# Patient Record
Sex: Male | Born: 1950 | Race: White | Hispanic: No | Marital: Married | Smoking: Former smoker
Health system: Southern US, Community
[De-identification: ages and names within clinical notes are randomized; demographics above are authoritative.]

## PROBLEM LIST (undated history)

## (undated) DIAGNOSIS — G8929 Other chronic pain: Secondary | ICD-10-CM

## (undated) DIAGNOSIS — I10 Essential (primary) hypertension: Secondary | ICD-10-CM

## (undated) HISTORY — PX: TOTAL HIP ARTHROPLASTY: SHX124

## (undated) HISTORY — PX: BACK SURGERY: SHX140

## (undated) HISTORY — PX: REPLACEMENT TOTAL KNEE BILATERAL: SUR1225

---

## 2000-01-19 ENCOUNTER — Ambulatory Visit (HOSPITAL_COMMUNITY): Admission: RE | Admit: 2000-01-19 | Discharge: 2000-01-19 | Payer: Self-pay | Admitting: *Deleted

## 2000-01-19 ENCOUNTER — Encounter: Payer: Self-pay | Admitting: *Deleted

## 2000-02-02 ENCOUNTER — Encounter: Payer: Self-pay | Admitting: *Deleted

## 2000-02-02 ENCOUNTER — Ambulatory Visit (HOSPITAL_COMMUNITY): Admission: RE | Admit: 2000-02-02 | Discharge: 2000-02-02 | Payer: Self-pay | Admitting: *Deleted

## 2000-02-18 ENCOUNTER — Encounter: Payer: Self-pay | Admitting: *Deleted

## 2000-02-18 ENCOUNTER — Ambulatory Visit (HOSPITAL_COMMUNITY): Admission: RE | Admit: 2000-02-18 | Discharge: 2000-02-18 | Payer: Self-pay | Admitting: *Deleted

## 2002-11-28 ENCOUNTER — Encounter: Payer: Self-pay | Admitting: Radiology

## 2002-11-28 ENCOUNTER — Encounter: Payer: Self-pay | Admitting: *Deleted

## 2002-11-28 ENCOUNTER — Encounter: Admission: RE | Admit: 2002-11-28 | Discharge: 2002-11-28 | Payer: Self-pay | Admitting: *Deleted

## 2002-12-14 ENCOUNTER — Encounter: Admission: RE | Admit: 2002-12-14 | Discharge: 2002-12-14 | Payer: Self-pay | Admitting: *Deleted

## 2003-01-05 ENCOUNTER — Encounter: Admission: RE | Admit: 2003-01-05 | Discharge: 2003-01-05 | Payer: Self-pay | Admitting: *Deleted

## 2003-01-31 ENCOUNTER — Inpatient Hospital Stay (HOSPITAL_COMMUNITY): Admission: RE | Admit: 2003-01-31 | Discharge: 2003-02-04 | Payer: Self-pay | Admitting: Orthopedic Surgery

## 2005-01-06 ENCOUNTER — Emergency Department (HOSPITAL_COMMUNITY): Admission: EM | Admit: 2005-01-06 | Discharge: 2005-01-06 | Payer: Self-pay | Admitting: Emergency Medicine

## 2005-01-07 ENCOUNTER — Inpatient Hospital Stay (HOSPITAL_COMMUNITY): Admission: EM | Admit: 2005-01-07 | Discharge: 2005-01-10 | Payer: Self-pay | Admitting: Emergency Medicine

## 2005-03-11 ENCOUNTER — Ambulatory Visit (HOSPITAL_BASED_OUTPATIENT_CLINIC_OR_DEPARTMENT_OTHER): Admission: RE | Admit: 2005-03-11 | Discharge: 2005-03-11 | Payer: Self-pay | Admitting: *Deleted

## 2007-08-10 ENCOUNTER — Encounter: Admission: RE | Admit: 2007-08-10 | Discharge: 2007-08-10 | Payer: Self-pay | Admitting: Orthopedic Surgery

## 2007-09-20 ENCOUNTER — Inpatient Hospital Stay (HOSPITAL_COMMUNITY): Admission: RE | Admit: 2007-09-20 | Discharge: 2007-09-25 | Payer: Self-pay | Admitting: Orthopedic Surgery

## 2009-09-27 IMAGING — CR DG CHEST 2V
2 series · 2 of 2 positions shown · non-contrast
Comparison: None available.

CLINICAL DATA: Preadmission respiratory film in patient for hip
surgery.

CHEST - 2 VIEW

[view not recorded (1 of 2)]
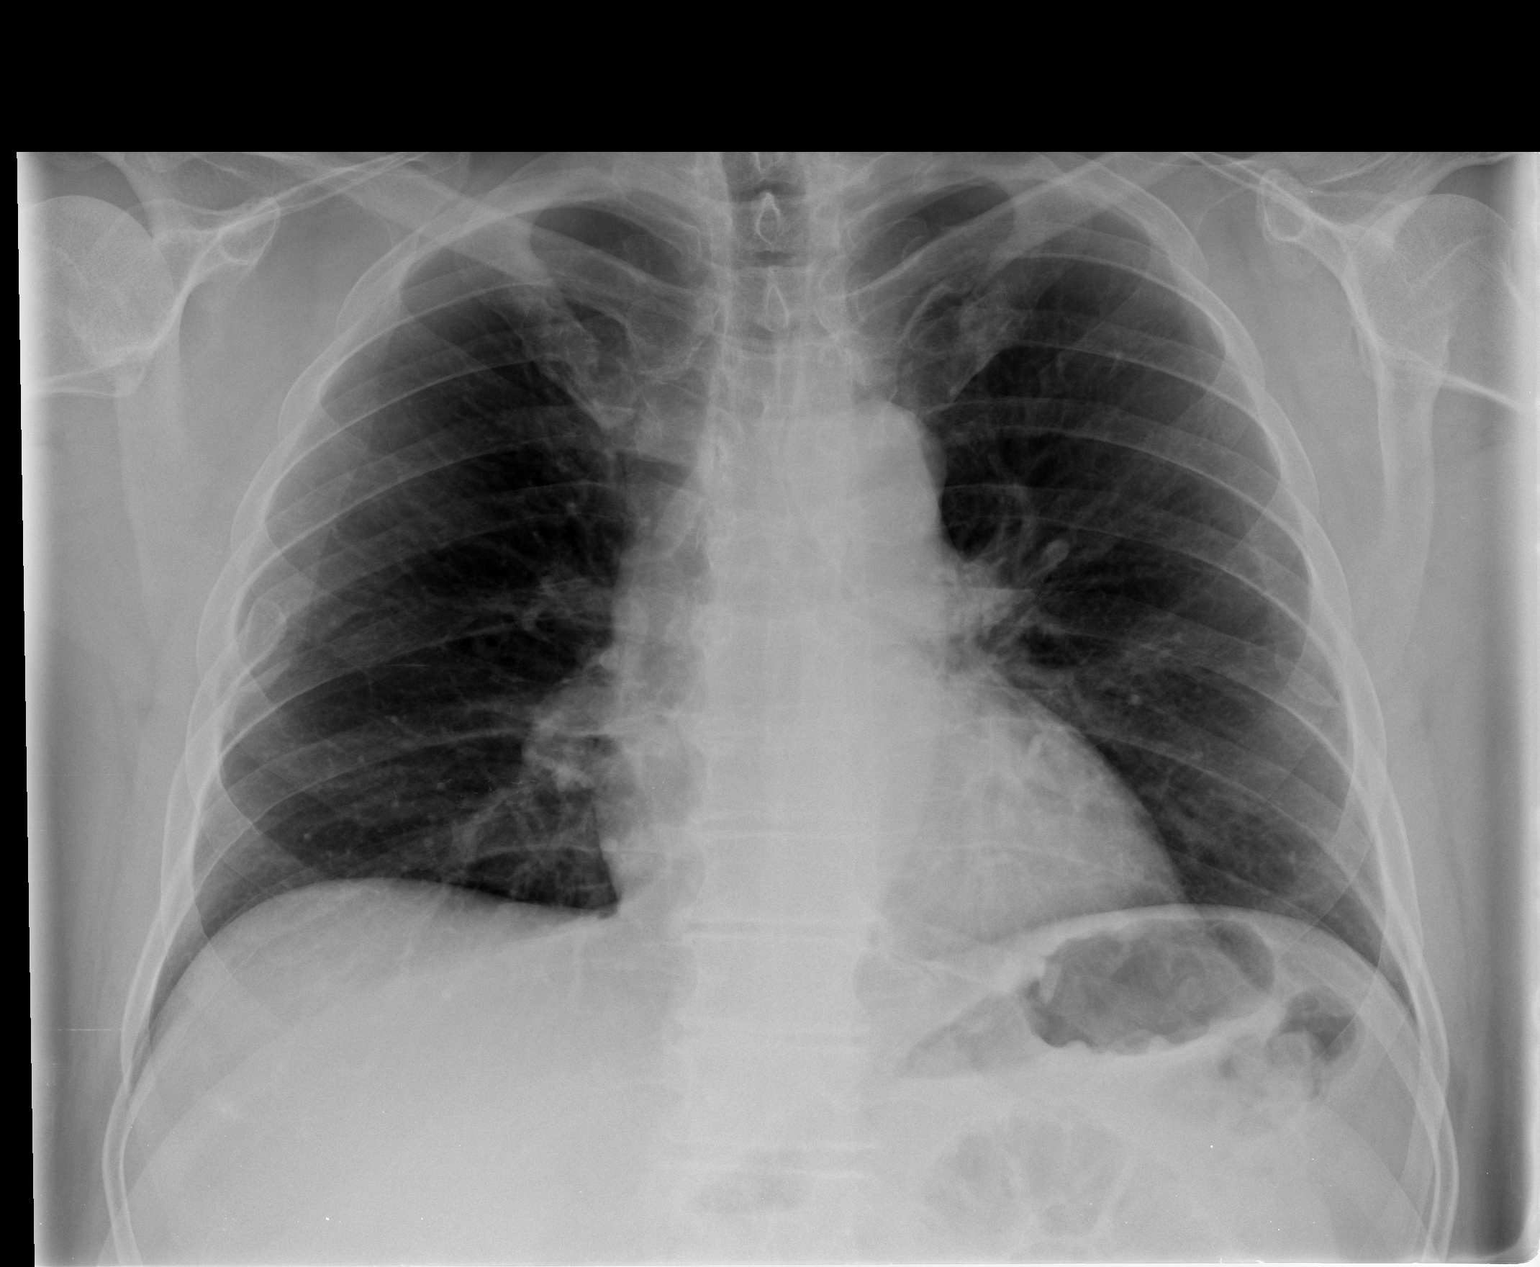

[view not recorded (2 of 2)]
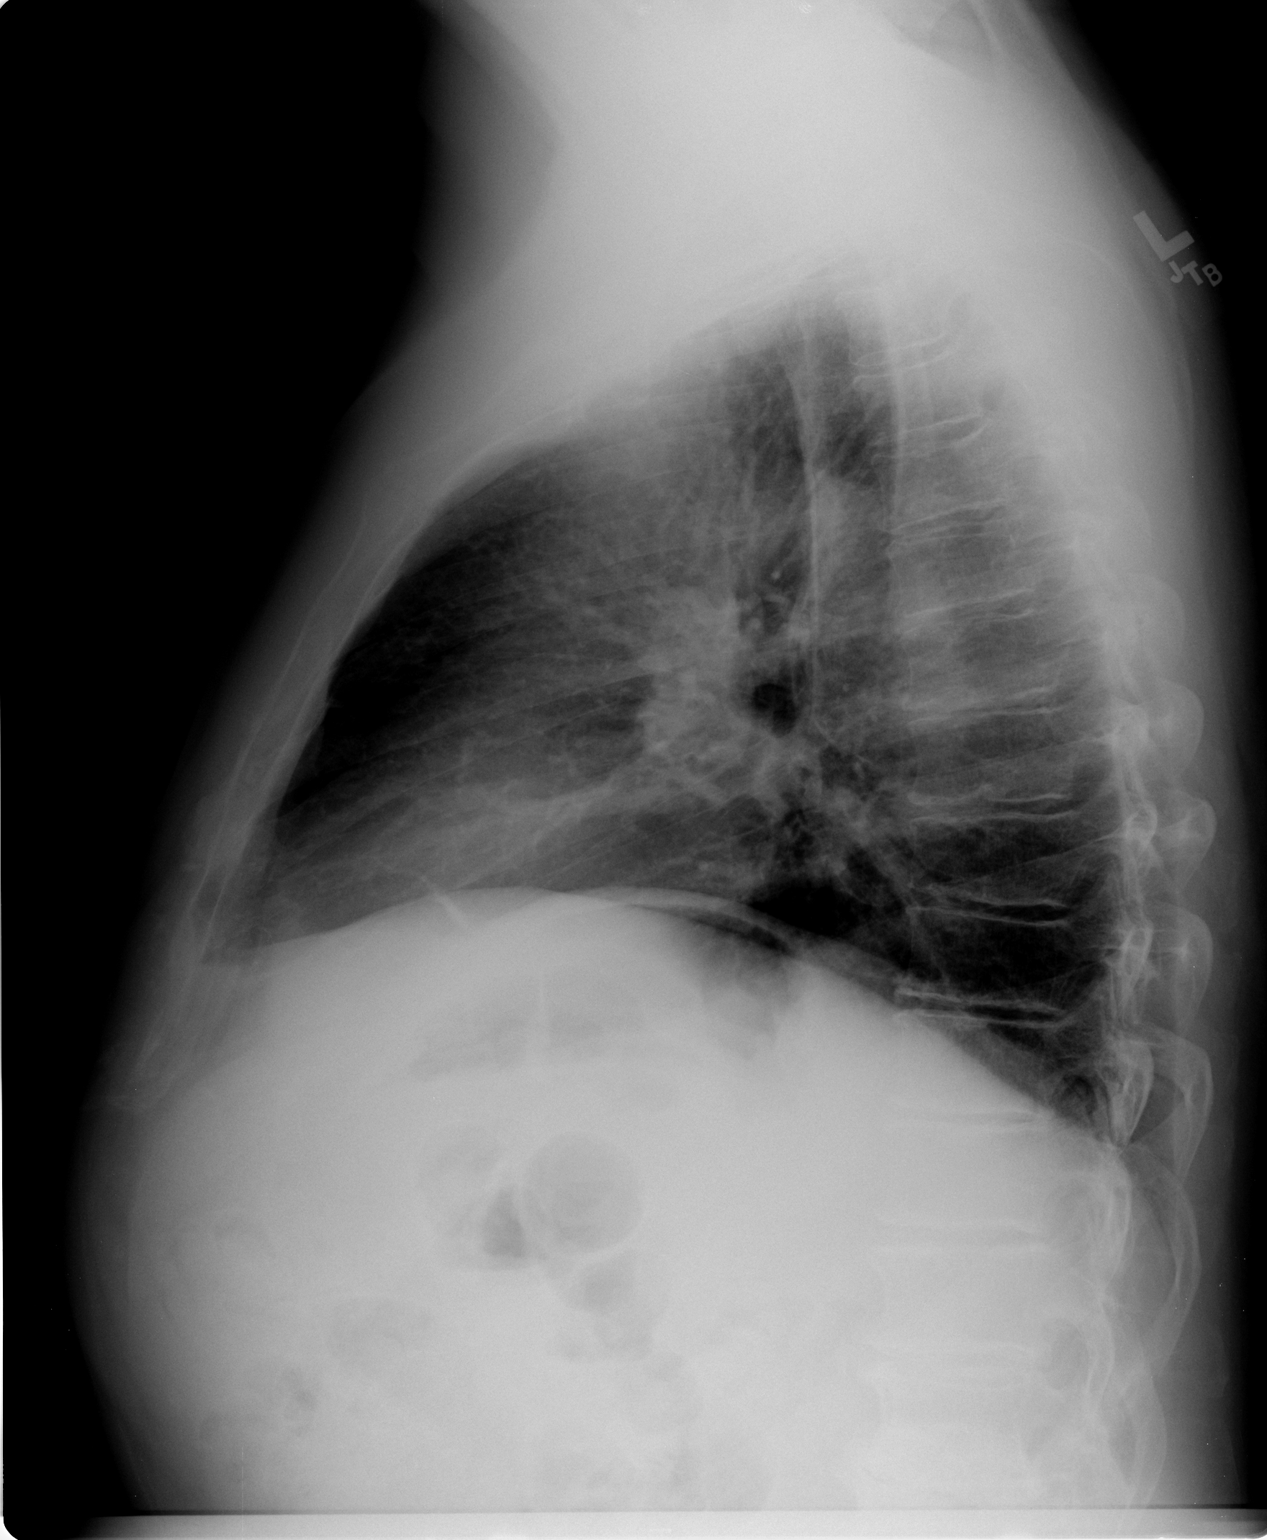

[2 of 2 positions shown; findings below may reference images not displayed]

FINDINGS: The lungs are clear.  There is no pleural effusion.
Heart size is normal.  Remote right rib fracture noted.
IMPRESSION: No acute disease.

## 2009-10-02 IMAGING — CR DG HIP 1V PORT*R*
1 series · 1 of 1 positions shown · non-contrast
Comparison: None.

CLINICAL DATA: Right hip osteoarthritis.

PORTABLE RIGHT HIP - 1 VIEW

[view not recorded]
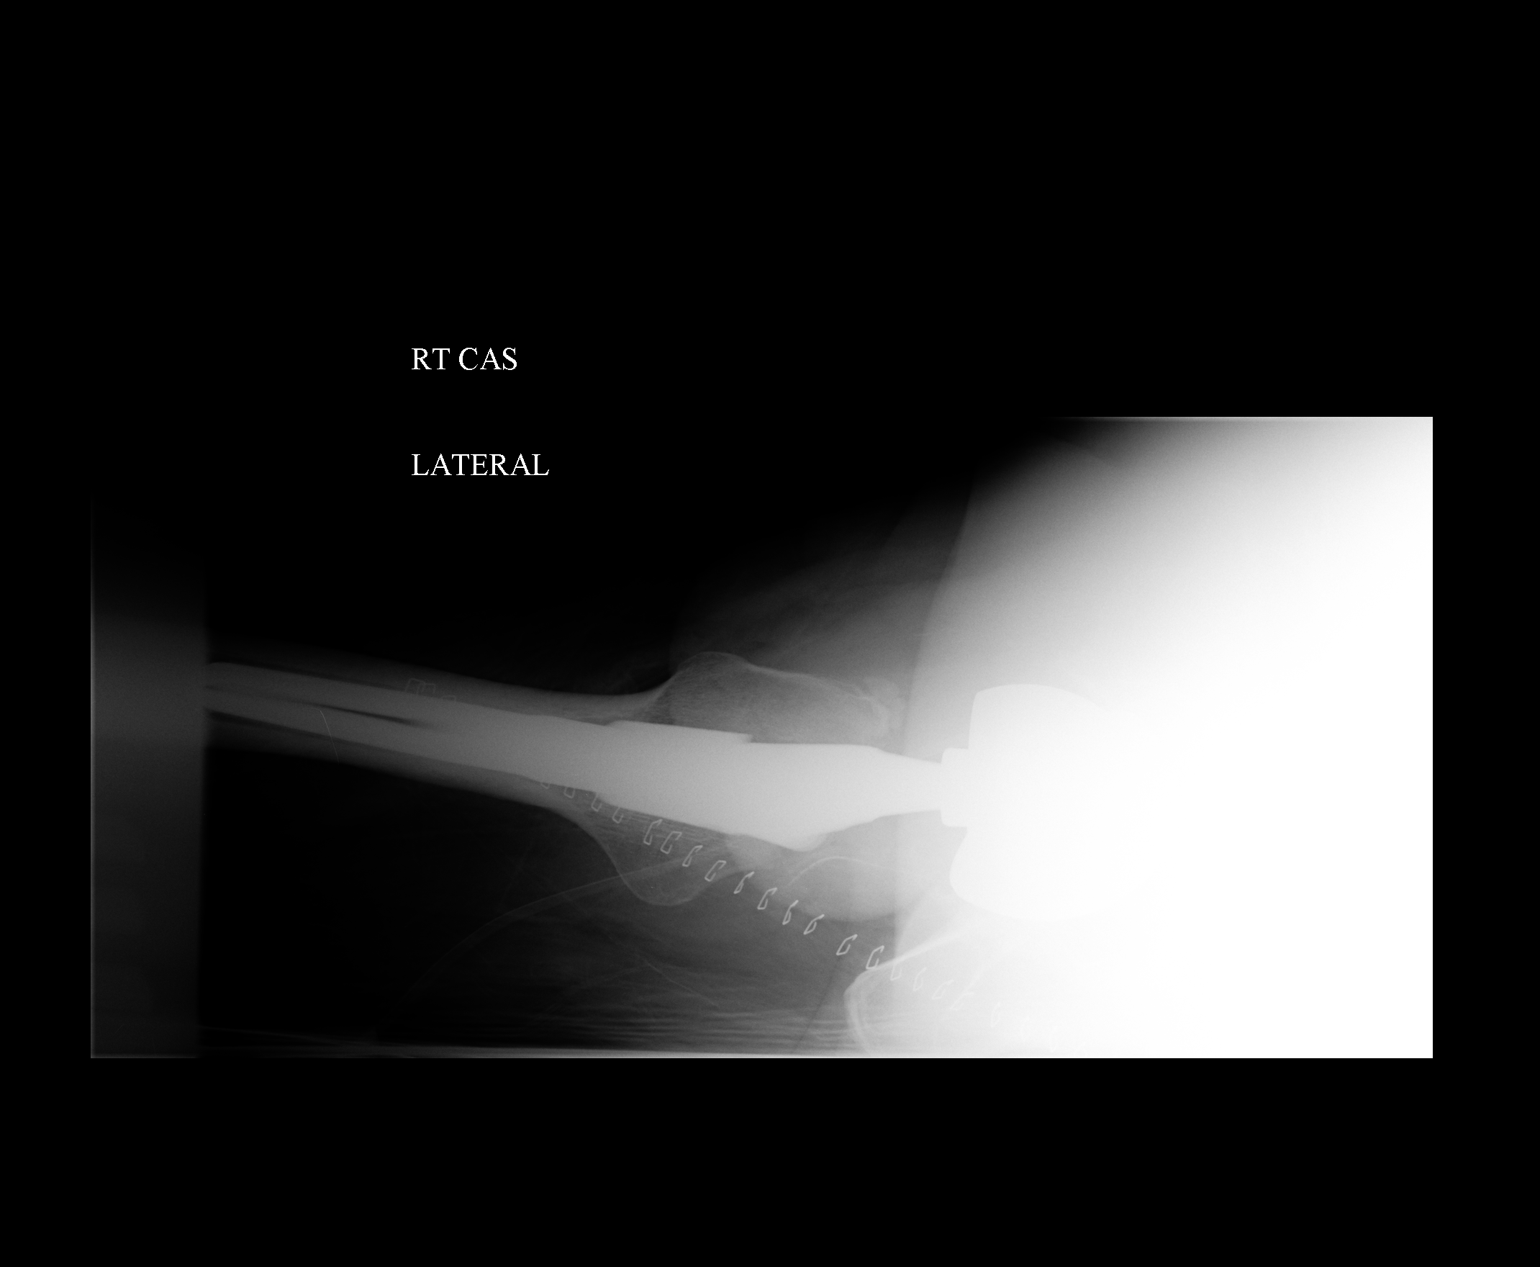

[1 of 1 positions shown; findings below may reference images not displayed]

FINDINGS: Right hip prosthesis appears in satisfactory position.
IMPRESSION: Right hip prosthesis appears in satisfactory position.

## 2010-07-01 NOTE — Op Note (Signed)
NAMEJOAS, MOTTON         ACCOUNT NO.:  [id number]   MEDICAL RECORD NO.:  [id number]          PATIENT TYPE:  INP   LOCATION:  2899                         FACILITY:  [facility]   PHYSICIAN:  Burnard Bunting, M.D.    DATE OF BIRTH:  [birth year]   DATE OF PROCEDURE:  09/20/2007  DATE OF DISCHARGE:                               OPERATIVE REPORT   PREOPERATIVE DIAGNOSIS:  Right hip arthritis.   POSTOPERATIVE DIAGNOSIS:  Right hip arthritis.   PROCEDURE:  Right total hip replacement.   SURGEON:  Burnard Bunting, MD   ASSISTANT:  Jerolyn Shin. Lavender, MD   ANESTHESIA:  General endotracheal.   ESTIMATED BLOOD LOSS:  250 mL.   DRAINS:  Hemovac x1.   INDICATIONS:  Douglas [name] is a 60 year old patient with end-  stage right hip arthritis who presents now for operative management of  his arthritis after explanation of risks and benefits.   COMPONENTS IMPLANTED:  DePuy ASR acetabular cup 58, S-ROM proximal  sleeve 20D large, S-ROM femoral stem 26 x 15 x 165, 36 standard neck,  +12 lateral with ASR 51 head.   PROCEDURE IN DETAIL:  The patient was brought to the operating room  where general endotracheal anesthesia was induced.  Preoperative  antibiotics were administered.  The patient was placed in lateral  decubitus position with left axilla and left peroneal nerve well-padded.  Right hip and leg and foot were prepped with DuraPrep solution after  prescrubbing with alcohol and Betadine which allowed the air to dry.  Collier Flowers was used to cover the operative field.  Sterile drapes were  applied.  Posterior approach to the hip was utilized.  Skin and  subcutaneous tissues were sharply divided.  Fascia lata was encountered  and divided.  The gluteus maximus muscles were divided in line with  their fibers.  The sciatic nerve was palpated and visualized and protect  all times during the case.  Piriformis tendon was tagged and released.  External rotators were detached from the capsule.   Bleeding points were  encountered were controlled using electrocautery.  Capsule was then T  split and tagged with #1 Ethilon suture.  Labrum was excised.  The  femoral neck cut was then made in accordance with preoperative  templating and with a templated modular implant.  Oscillating saw was  utilized.  Cut was made.  The lateralization was then performed with the  use of a rongeur, canal finder, and canal lateralizer.  Canal was then  reamed up to 15.5 mm in accordance with preoperative templating.  At  this time, the acetabulum was then reamed in 45 degrees of abduction and  20 degrees of anteversion.  Bleeding bone was encountered between the  columns.  The cup was then seated in good position.  The spout reaming  was then performed for the proximal femur.  The spout and the proximal  sleeve followed by the trial implant in 20 degrees of anteversion was  then placed.  A +6 and +9 trials were utilized with a 36 standard +12  lateralized neck.  Intraoperative x-ray demonstrated good leg lengths  with +9.  With the +9, the hip was stable in external rotation and full  extension, the position of sleep as well as 90 degrees of hip flexion,  10 degrees of adduction, and 70 degrees of internal rotation.  At this  time, trial femoral components were removed.  Thorough irrigation was  performed.  True femoral components were placed with good seating of the  prosthesis.  The same stability parameters were maintained.  The sciatic  nerve was then palpated and found to be intact.  The capsule was closed  using a #1 Ethilon suture followed by placement of a Hemovac drain.  The  fascia lata was then closed using interrupted inverted #1 suture  followed by interrupted inverted 0 Vicryl suture, 2-0 Vicryl suture, and  skin staples.  Leg lengths were proximally equal.  Bulky dressing was  applied along with knee immobilizer.  The patient tolerated the  procedure well without immediate complication.   Dr. Lenny Pastel  assistance was required all times during the case for retraction of  important neurovascular structures and limb positioning.  His assistance  was a medical necessity.      Burnard Bunting, M.D.  Electronically Signed     GSD/MEDQ  D:  09/20/2007  T:  09/20/2007  Job:  [id number]

## 2010-07-04 NOTE — Discharge Summary (Signed)
NAMEVIBHAV, [name]         ACCOUNT NO.:  [id number]   MEDICAL RECORD NO.:  [id number]          PATIENT TYPE:  INP   LOCATION:  1521                         FACILITY:  [facility]   PHYSICIAN:  Mark C. Ophelia Charter, M.D.    DATE OF BIRTH:  12-Jun-1950   DATE OF ADMISSION:  01/07/2005  DATE OF DISCHARGE:  01/10/2005                                 DISCHARGE SUMMARY   FINAL DIAGNOSIS:  Left hand thumb abscess with cellulitis.   This 60 year old male was admitted with a necrosis of the left thumb.  Negative x-rays for foreign body, temperature 103 and tense swollen hand  with phlebitis approximately up the arm with some cellulitis streaks up just  past the elbow. He was seen by Osvaldo Shipper, MD for consultation and after  evaluation by Dr. Rito Ehrlich, he asked that I see the patient. The patient was  taking some lisinopril HCTZ for hypertension. He had been on some Augmentin,  Percocet, __________ and Tylenol.   HOSPITAL COURSE:  The patient was taken to the operating room on an emergent  basis and had exploration with minimal purulent material found and appeared  to be primarily significant cellulitis present without evidence of flexor  tenosynovitis. He was initially placed on Vancomycin which was stopped on  January 10, 2005. He was instructed on warm water soaks with Betadine. He  was discharged on Keflex 500 mg p.o. t.i.d. and also Vicodin and was having  daily dressing changes and hand soaks at that time.   FINAL DIAGNOSIS:  Left thumb infection with cellulitis. Cultures intraop  were seen as negative but he had significant improvement on Vicodin so is  likely to continue coverage for Gram positive cocci despite the negative  cultures. Office followup with my office one week after discharge and  outpatient dressing changes and debridement and irrigation were arranged.      Mark C. Ophelia Charter, M.D.  Electronically Signed     MCY/MEDQ  D:  01/28/2005  T:  01/29/2005  Job:  [id number]

## 2010-07-04 NOTE — Consult Note (Signed)
NAME:  Douglas [name], Douglas [name] NO.:  [id number]   MEDICAL RECORD NO.:  [id number]          PATIENT TYPE:  EMS   LOCATION:  ED                            FACILITY:  [facility]   PHYSICIAN:  Osvaldo Shipper, MD     DATE OF BIRTH:  [birth year]   DATE OF CONSULTATION:  01/07/2005  DATE OF DISCHARGE:                                   CONSULTATION   The patient's primary doctor is Dr. Maurice Small from [facility] [place].  He also sees a physician by the name of Dr. Vernell Leep  in [place].   REQUESTING PHYSICIAN:  Rhae Lerner. Margretta Ditty, M.D., emergency department.   REASON FOR CONSULTATION:  Left hand infection.   CHIEF COMPLAINT:  Left hand pain for one day.   HISTORY OF PRESENT ILLNESS:  Patient is a 60 year old Caucasian male with a  history of hypertension, significant arthritis, who was apparently well  until yesterday when he noticed a cut in his left thumb area.  The patient  mentions that he gets a lot of cracked skin because of dry skin, and he has  a lot of these cuts in his right hand; however, he noticed a cut in his left  thumb.  He did not think too much about it.  Subsequently, however, he  noticed that the whole area was becoming red and painful.  He also noticed  swelling late in the evening yesterday, at which point, he decided to come  into our ED last night.  In the ED, he was diagnosed with mild cellulitis  and was prescribed Augmentin.  He was asked to follow up with his physician;  however, the patient's condition worsened overnight and over the course of  the day today.  He noticed that the erythema was progressing up his forearm  and all the way up to his upper arm to the axillary region.  He also started  getting bloody and serous drainage from his left thumb.  The patient also  gave a history of a temperature of 103 earlier this morning.  He also gives  a history of chills along with his fever.  He also gave a history of nausea  and  vomiting.  No history of any dysuria or any diarrhea or constipation.  Today he went to his doctor at [facility], who did some blood work, and he was  found to have an elevated white count, at which point, he decided to come  into the ED.   PAST MEDICAL HISTORY:  1.  Hypertension x10 years.  2.  History of total knee replacements bilaterally.  3.  History of left hip replacement.  4.  History of arthritis involving most of his joints, especially his back      and his right hip.   SOCIAL HISTORY:  Lives with his wife at [place].  He works with [employer].  He is married with children.  Quit smoking within 10 years ago.  Has  about a 15-pack-year history of smoking.  Occasional beer consumption.  No  drug use.   FAMILY HISTORY:  Father had arthritis and hypertension.  Died of blood clot,  possibly a PE.  Mother had lung cancer and diabetes.   REVIEW OF SYSTEMS:  A 10-point review of systems was done and was  unremarkable, except as mentioned in the HPI.   PHYSICAL EXAMINATION:  VITAL SIGNS:  Temperature 100.9 here, blood pressure  120/76, heart rate 95, respiratory rate 18, saturating 97% on room air.  GENERAL:  A well-developed and well-nourished individual in slight  discomfort but in no distress.  HEENT:  There is no pallor or icterus.  Oral mucous membranes are slightly  dry.  No oral lesions are seen.  NECK:  Soft and supple.  LUNGS:  Clear to auscultation bilaterally with no wheezes, rhonchi, or  rales.  CARDIOVASCULAR:  S1 and S2 is normal.  Regular rhythm.  There is a systolic  murmur appreciated in the mitral area with no radiation.  ABDOMEN:  Soft, nontender, nondistended.  Bowel sounds are present.  No mass  or organomegaly appreciated.  EXTREMITIES:  Left upper extremity:  The left hand reveals significant  swelling, erythema, and warmth over the thenar eminence, along with the  thumb.  There is definite black-bluish area on the palmar aspect of the  first finger.   This area is definitely cool to palpation, compared to the  rest of his involved area as well as uninvolved area.  His radial pulse is  palpable.  He also has streaking erythema extending all the way up his hand  going up the forearm of the arm all the way to the axilla.  I do not  appreciate any lymph nodes at this time.  Patient is unable to make a fist  with his left hand because of the swelling and the pain.  On the right hand,  he has erythematous rash on the dorsal aspect of the right hand.  He also  has scaly plaque formation over the elbow region dorsally on both his upper  extremities.   LAB DATA:  White count here is 20,000 with 92% neutrophils.  Hemoglobin  13.8, MCV 95, platelet count 200.  There is moderate left shift noted on the  smear.  Sodium 129, potassium 3.4, chloride 95, bicarb 22, glucose 160, BUN  21, creatinine 1.2, calcium 8.9.  Those are the only labs available.  The  patient underwent x-ray yesterday, which apparently did not show any acute  problem in his left hand.   IMPRESSION:  This is a 60 year old Caucasian male with a history of  hypertension, who presents with infection and inflammation of his left hand.  I am very concerned about this process involving his lateral compartment.  I  am also concerned about a possible gangrenous area on his thumb.  Based on  the above observations, I believe the patient needs to be urgently evaluated  by a hand surgeon for possible exploration and evacuation.  I discussed this  issue with Dr. Margretta Ditty, who preferred that I speak with the hand surgeon.  I made a call to Dr. Ophelia Charter at [facility], who said that he was backed up  with multiple cases and suggested we call [facility].  Dr.  Margretta Ditty spoke with the attending at [facility], who also said that their  service was full.  I recontacted Dr. Ophelia Charter, who graciously finally accepted the patient for transfer to [facility].  Patient has been given   doxycycline by the ED physician.  The above plan was discussed with the  patient in detail.  He was  told that if urgent care is not given, he might  potentially have loss of use of either his thumb or multiple fingers on his  left hand.  All of the paperwork for transfer has been done by myself, and  the patient will be sent to [facility] via [facility].   HOME MEDICATIONS:  1.  Lisinopril/HCT 20/12.5 once daily.  2.  Augmentin.  3.  Percocet.  4.  Ibuprofen.  5.  Tylenol.  6.  Vitamins.  7.  Aspirin.  8.  Chondroitin sulfate.  9.  MSM.  10. Glucosamine.   ALLERGIES:  PENICILLIN, which causes hives and rash.      Osvaldo Shipper, MD  Electronically Signed     GK/MEDQ  D:  01/07/2005  T:  01/07/2005  Job:  [id number]   cc:   Gretta Arab. Valentina Lucks, M.D.  Fax: [phone]   Corrie Mckusick  Fax: [phone]   Rhae Lerner. Margretta Ditty, M.D.  [address]  [place]  [place] [address]   Margaretmary Dys, M.D.   Veverly Fells. Ophelia Charter, M.D.  Fax: [phone]

## 2010-07-04 NOTE — Op Note (Signed)
Douglas [name], Douglas [name]         ACCOUNT NO.:  [id number]   MEDICAL RECORD NO.:  [id number]          PATIENT TYPE:  INP   LOCATION:  1521                         FACILITY:  [facility]   PHYSICIAN:  Mark C. Ophelia Charter, M.D.    DATE OF BIRTH:  [birth year]   DATE OF PROCEDURE:  01/07/2005  DATE OF DISCHARGE:                                 OPERATIVE REPORT   PREOPERATIVE DIAGNOSIS:  Left thumb cellulitis with abscess, proximal flexor  tenosynovitis.   POSTOPERATIVE DIAGNOSIS:  Left thumb cellulitis.   SURGEON:  Mark C. Ophelia Charter, M.D.   PROCEDURE:  Irrigation and debridement of skin and subcutaneous tissue, left  thumb.   This 60 year old male was seen at [facility] the day prior to  surgery at night with a swollen thumb, erythema of the thumb, pain, had  temperature of 103, nausea with vomiting. His symptoms increased. He called  the emergency room and was told to see his local medical doctor and then  represented at the emergency room at [facility]. He was seen by Dr. Osvaldo [name] who is a hospitalist at [facility] who called me and was concerned  about abscess formation. X-rays were negative. 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 Exam demonstrated painful swollen thumb. He had  significant pain with motion, cellulitis with red streaks running up all the  way to the axilla about 50% of the volar surface and one third of the dorsal  surface. There was no tenderness over the thenar bursa but did have  exquisite tenderness of the mid portion of the thumb, and there was necrosis  of the skin with lesion of skin about 4 x 5 mm over the proximal phalanx at  the level of the radial neurovascular bundle. He had pain with flexion and  significant tenderness over the tendon sheath.   After standard prepping and draping with general anesthesia using Betadine  scrub and Betadine solution, tourniquet had been applied but was not  inflated, extremity sheets and drapes were used.  Initial incision was made  mid lateral adjacent to the area. The skin was very friable and dermis and  epidermis easily separated. There was some necrosis of the dermis. This was  debrided, and then probing with scissors bluntly was performed. The veno  communicante adjacent to the digital artery was thrombosed. Digital artery  was not thrombosed. Digital nerve was inspected and was normal. Spreading  distally, there was no evidence of a felon. Flexor tendon sheath was  inspected. Douglas [name] was placed in the tendon sheath mid lateral and spread open.  Digit was flex extended and attempted to milk out some tenosynovium, but  there was no evidence of flexor tenosynovitis. There was some subcutaneous  tissue necrosis which was centered at the area of the skin necrosis. The  patient did have multiple dry areas of both hands with cracking which has  been a chronic condition for him. Cultures were taken as soon as the wound  was opened. There was no specific areas of abscess or purulence. In essence,  this appeared to be significant cellulitis with some localized area of  skin  necrosis from the cellulitis. Copious irrigation was used with bulb syringe  including the tendon sheath. Attempts again were used to milk as well as  apply pressure to the IP joint to make sure there was no evidence of  purulence, but none was found. As soon as cultures were obtained, 1 g of  Ancef was given, and this was  followed up by 1 g of vancomycin. The patient will be admitted for elevation  and treatment of cellulitis, IV antibiotics, Cleocin and vancomycin and then  daily dressing changes and hand therapy lavage. The patient was transferred  to the recovery room in stable condition.      Mark C. Ophelia Charter, M.D.  Electronically Signed     MCY/MEDQ  D:  01/08/2005  T:  01/08/2005  Job:  [id number]

## 2010-07-04 NOTE — Discharge Summary (Signed)
NAMEARTURO, [name]         ACCOUNT NO.:  [id number]   MEDICAL RECORD NO.:  [id number]          PATIENT TYPE:  INP   LOCATION:  5018                         FACILITY:  [facility]   PHYSICIAN:  Burnard Bunting, M.D.    DATE OF BIRTH:  1950-06-30   DATE OF ADMISSION:  09/20/2007  DATE OF DISCHARGE:  09/25/2007                               DISCHARGE SUMMARY   ADMISSION DIAGNOSES:  1. Osteoarthritis of the right hip.  2. Status post bilateral total knee arthroplasties.  3. Hypertension.   DISCHARGE DIAGNOSES:  1. Osteoarthritis of the right hip.  2. Status post bilateral total knee arthroplasties.  3. Hypertension.  4. Mild posthemorrhagic anemia.  5. Status post left total hip replacement, 2004.   PROCEDURE:  On September 20, 2007, the patient underwent right total hip  replacement performed by Dr. August Saucer, assisted by Dr. Tresa Res under  general anesthesia.   CONSULTATIONS:  None.   BRIEF HISTORY:  Douglas [name] is a 60 year old male with end-stage  right hip arthritis who has failed nonoperative management.  It was  felt, he would benefit from surgical intervention and was admitted for  the procedure as stated above.   BRIEF HOSPITAL COURSE:  The patient tolerated the procedure under  general anesthesia without complications.  Postoperatively, he was  placed on Coumadin for DVT prophylaxis.  Adjustments in Coumadin dose  were made according to daily protimes by the pharmacist at [facility].  The patient was started on the usual physical therapy program  for ambulation and gait training.  He was allowed weightbearing as  tolerated utilizing a walker.  He was instructed in total hip  replacement precautions and demonstrated ability to observe these.  The  patient was able to ambulate as much as 190 feet prior to discharge.  Dressing changes were done daily and the patient's wound was healing  without erythema or drainage.  The patient initially was treated with  PCA  analgesics and weaned to p.o. analgesics without difficulty.  At the  time of discharge, the patient was voiding without difficulty.  He was  taking a regular diet.  At discharge, INR was 2.5.  At discharge,  hemoglobin 9.4 and hematocrit 27.5.  During the hospital stay, the  patient had sodium as low as 134 with glucose as high as 138, otherwise  values were within normal limits.  Urinalysis on admission was negative  for urinary tract infection and culture taken on September 15, 2007, showed  no growth.  Postoperative x-ray of the right hip showed satisfactory  position of right hip prosthesis.  EKG on admission showed sinus  bradycardia, left axis deviation, no significant change since last  tracing confirmed by Dr. Peter Swaziland.   PLAN:  The patient was discharged to his home.  He was provided durable  medical equipment as necessary.  Arrangements made for home health  physical therapy.  He will continue to be weightbearing as tolerated.  He will keep his incision dry and clean.  Daily dressing changes.  The  patient will continue to manage with total hip replacement precautions.  He will resume a regular diet.  He is instructed to call should he have  fever, chills or drainage from his wound.  Otherwise, he will see Dr.  August Saucer on September 30, 2007.   MEDICATIONS AT DISCHARGE:  1. Percocet 10/325 one every 3-4 hours as needed for pain.  2. Coumadin 5 mg once daily.  3. Robaxin 500 mg one every 8 hours as needed for spasm.   The patient will resume home medications including lisinopril; however,  he will not resume his Vicodin or ibuprofen and was given a medication  reconciliation form with these instructions.  All questions encouraged  and answered.   CONDITION ON DISCHARGE:  Stable.      Wende Neighbors, P.A.      Burnard Bunting, M.D.  Electronically Signed    SMV/MEDQ  D:  10/27/2007  T:  10/27/2007  Job:  [id number]

## 2010-07-04 NOTE — Discharge Summary (Signed)
NAMEJAYANTH, [name]                     ACCOUNT NO.:  [id number]   MEDICAL RECORD NO.:  [id number]                   PATIENT TYPE:  INP   LOCATION:  5040                                 FACILITY:  [facility]   PHYSICIAN:  Burnard Bunting, M.D.                 DATE OF BIRTH:  [birth year]   DATE OF ADMISSION:  01/31/2003  DATE OF DISCHARGE:  02/04/2003                                 DISCHARGE SUMMARY   DISCHARGE DIAGNOSIS:  Left hip arthritis.   SECONDARY DIAGNOSES:  1. Left total knee replacement.  2. Right total knee replacement.  3. Right elbow fracture.  4. Degenerative disk disease of the back.  5. Cervical spine degenerative disease.   OPERATION __________ PROCEDURES:  Left total hip replacement, January 31, 2003.   HOSPITAL COURSE:  Douglas [name] is a 60 year old patient with end-  stage arthritis of his left hip.  He presents now for operative management.  The patient underwent left total hip arthroplasty, January 31, 2003.  The  patient tolerated the procedure well without immediate complications.  Postop hematocrit on postop day #1 was 30.1.  The patient had equal leg  lengths and intact dorsiflexion and plantarflexion on postop day #1.  He was  mobilized and started on Coumadin for DVT prophylaxis.  The patient  ambulated 45 to 60 feet in the hall on postop day #2.  Incision was intact.  Leg lengths were equal.  The patient progressed well with physical therapy  and had an intact incision by postop day #4.  At that time, he was deemed  safe for discharge.   DISCHARGE MEDICATIONS:  Discharge medications included preadmission  medications plus Percocet for pain and Coumadin 5 mg p.o. daily to INR of  2.0 to 2.5.   FOLLOWUP:  Follow up with me in about 7 to 10 days for suture removal.                                                Burnard Bunting, M.D.    GSD/MEDQ  D:  03/04/2003  T:  03/05/2003  Job:  [id number]

## 2010-07-04 NOTE — Op Note (Signed)
Douglas [name], Douglas [name]         ACCOUNT NO.:  [id number]   MEDICAL RECORD NO.:  [id number]          PATIENT TYPE:  AMB   LOCATION:  DSC                          FACILITY:  [facility]   PHYSICIAN:  Tennis Must Meyerdierks, M.D.DATE OF BIRTH:  1950/03/27   DATE OF PROCEDURE:  03/11/2005  DATE OF DISCHARGE:                                 OPERATIVE REPORT   PREOPERATIVE DIAGNOSIS:  Full-thickness skin loss, left thumb.   POSTOPERATIVE DIAGNOSIS:  Full-thickness skin loss, left thumb.   PROCEDURE:  Full-thickness skin graft from left arm to left thumb.   SURGEON:  Lowell Bouton, M.D.   ANESTHESIA:  General.   OPERATIVE FINDINGS:  The patient had a 3 x 2 cm full thickness skin defect  over the volar aspect of the left thumb. There was some tendon exposed with  what appeared to be paratenon overlying it.   PROCEDURE:  Under general anesthesia with a tourniquet on the left arm, the  left hand was prepped and draped in the usual fashion and the thumb wound  was debrided sharply. There was good granulation tissue base both proximally  and distally with an open area of tendon in the center. The defect was  measured and was found to measure 3 x 2 cm. The medial upper arm was then  marked with a marking pen and the arm was elevated and tourniquet was  inflated to 250 mmHg. An oval-shaped 3 x 2 cm full-thickness skin graft was  then taken sharply from the medial arm. The graft was placed in a saline-  soaked gauze and the bleeding points were coagulated with electrocautery on  the upper arm. The skin edges were undermined and the wound was closed with  4-0 Vicryl in the subcutaneous tissues and a 3-0 subcuticular Prolene in the  skin. Marcaine 0.5% was inserted in the wound edges for pain control. Steri-  Strips were applied followed by sterile dressings. The full-thickness skin  graft was then placed over the defect in the thumb and applied with 4-0  nylon sutures. Puncture wounds  were made in the graft to allow for drainage  in the graft was trimmed proximally and distally to contour to the defect.  Quarter inch Steri-Strips and Benzoin and were used to hold the graft down  to the thumb. This allowed for good compression without a cotton ball  bolster. A digital block was performed with 0.5% Marcaine in the thumb.  Sterile dressings were then applied and the tourniquet was released with  good circulation of the hand. The patient went to th recovery room awake and  stable in good condition.      Lowell Bouton, M.D.  Electronically Signed     EMM/MEDQ  D:  03/11/2005  T:  03/11/2005  Job:  [id number]

## 2010-07-04 NOTE — Op Note (Signed)
Douglas [name], Douglas [name]                     ACCOUNT NO.:  [id number]   MEDICAL RECORD NO.:  [id number]                   PATIENT TYPE:  INP   LOCATION:  5040                                 FACILITY:  [facility]   PHYSICIAN:  Burnard Bunting, M.D.                 DATE OF BIRTH:  November 05, 1950   DATE OF PROCEDURE:  01/31/2003  DATE OF DISCHARGE:  02/04/2003                                 OPERATIVE REPORT   PREOPERATIVE DIAGNOSIS:  Left hip arthritis.   POSTOPERATIVE DIAGNOSIS:  Left hip arthritis.   PROCEDURE:  Left total hip arthroplasty.   SURGEON:  Burnard Bunting, M.D.   ASSISTANT:  __________   ANESTHESIA:  General endotracheal anesthesia.   ESTIMATED BLOOD LOSS:  100 mL.   DRAINS:  None.   DESCRIPTION OF PROCEDURE:  The patient was brought to the operating room and  where general anesthesia. Proper IV antibiotics were administered. The  patient was placed in the lateral decubitus position  with the right axilla  and right peroneal nerve well padded. The left hip, leg and foot were  prepped with Duraprep solution and draped in a sterile manner. Collier Flowers was  used to drape the operative field.   A posterior approach to the hip was utilized. An incision measuring about 15  cm was used. The skin and subcutaneous tissue were sharply divided. The  fascia lata was encountered and divided. The gluteus maximus muscle was  split in line with their fibers. The bursa was excised. The sciatic nerve  was palpated and protected at all times during the remain portion of the  case.   The Charnley retractor was placed. The piriformis tendon was identified and  tagged. The other  external rotators were dissected free from  the posterior  capsule. The capsule itself was then identified  and split in a T-fashion  with each flap tagged with a #1 Tycron suture   The hip at this time was dislocated. Label tissue was excised  circumferentially. The pulvinar was removed. The femoral neck  cut was  then  performed at approximately 1 to 1-1/2 cm proximal to the superior aspect of  the lesser trochanter in accordance with preoperative templating. Following  resection of the femoral head and part of the neck, the  anterior acetabular  retractor was carefully  placed on the anterior wall of the acetabulum.   With good visualization, reaming of the acetabulum was performed at  approximately 45 degrees of abduction and 20 to 25 degrees of anteversion.  Sequential reaming was performed up to size 56. A trial was then placed with  a 0 degree trial liner.   At this time the femur was prepared. Staying lateral within the trochanteric  region, the femur  was  reamed and broached to accept a size 11 secure fit  plus stem. Prior to fully seating the size 11, a size 8 broach was trial  reduced with a  0 and +5 head. Good leg length and stability was achieved  with full stability and full extension in external rotation position of  __________ as well as 90 degrees of hip flexion, 10 degrees of hip abduction  and about 70 degrees of internal rotation of the hip.   At this time the trial broaches were removed. The femur was then reamed and  broached up to accept the size 11 Secure Fit plus stem. Distal reaming was  also performed. At this time the gold broach was placed and trial reduction  was performed. Intraoperative x-rays showed good leg length and good  position  of components.   The trial components were removed and the true acetabulum was placed with  good press fit obtained. The 0 degree ceramic liner was placed. At this time  the true stem was placed, and once again trial reduction was performed with  a 0 and +5 head. The 0 head was chosen which gave excellent stability in  full extension and external rotation position __________ as well as flexion,  adduction and at 70 degrees of internal rotation.   At this time the incision was thoroughly irrigated. The posterior capsule  was closed  using #1 Tycron suture. The external rotators were then  reapproximated to the closed capsule using the tagged #1 Ethibond suture.  The fascia lata was then closed using #1 Vicryl suture. The skin was closed  using interrupted inverted 2-0 Vicryl suture and skin staples.  An impervious dressing  was placed.   At the completion of the case the leg lengths were equal and the patient had  good dorsiflexion. He was transferred to the recovery room in stable  condition.                                               Burnard Bunting, M.D.    GSD/MEDQ  D:  03/04/2003  T:  03/04/2003  Job:  [id number]

## 2010-11-14 LAB — CBC
HCT: 38.8 — ABNORMAL LOW
HCT: 27 — ABNORMAL LOW
HCT: 27.5 — ABNORMAL LOW
HCT: 27.6 — ABNORMAL LOW
Hemoglobin: 13.3
Hemoglobin: 9.4 — ABNORMAL LOW
Hemoglobin: 9.4 — ABNORMAL LOW
Hemoglobin: 9.5 — ABNORMAL LOW
MCHC: 34.3
MCHC: 34
MCHC: 34.3
MCHC: 35.2
MCV: 99.3
MCV: 100.3 — ABNORMAL HIGH
MCV: 97.5
Platelets: 261
Platelets: 186
Platelets: 195
RBC: 3.9 — ABNORMAL LOW
RBC: 2.75 — ABNORMAL LOW
RBC: 2.77 — ABNORMAL LOW
RDW: 11.8
RDW: 11.7
RDW: 11.8
RDW: 12
WBC: 7.1
WBC: 11 — ABNORMAL HIGH
WBC: 11.6 — ABNORMAL HIGH

## 2010-11-14 LAB — TYPE AND SCREEN
ABO/RH(D): A POS
Antibody Screen: NEGATIVE

## 2010-11-14 LAB — BASIC METABOLIC PANEL
BUN: 16
BUN: 12
BUN: 7
CO2: 29
CO2: 27
CO2: 29
Calcium: 10.1
Calcium: 8.3 — ABNORMAL LOW
Calcium: 8.8
Chloride: 99
Chloride: 100
Chloride: 97
Creatinine, Ser: 1.14
Creatinine, Ser: 0.79
Creatinine, Ser: 0.89
GFR calc Af Amer: >60
GFR calc Af Amer: >60
GFR calc Af Amer: >60
GFR calc non Af Amer: >60
GFR calc non Af Amer: >60
GFR calc non Af Amer: >60
Glucose, Bld: 94
Glucose, Bld: 122 — ABNORMAL HIGH
Glucose, Bld: 138 — ABNORMAL HIGH
Potassium: 4.1
Potassium: 3.5
Potassium: 3.5
Sodium: 139
Sodium: 134 — ABNORMAL LOW
Sodium: 134 — ABNORMAL LOW

## 2010-11-14 LAB — PROTIME-INR
INR: 1
INR: 1.2
INR: 1.5
INR: 2.5 — ABNORMAL HIGH
INR: 2.8 — ABNORMAL HIGH
Prothrombin Time: 13.2
Prothrombin Time: 15.2
Prothrombin Time: 18.3 — ABNORMAL HIGH

## 2010-11-14 LAB — URINE CULTURE
Colony Count: NO GROWTH
Culture: NO GROWTH

## 2010-11-14 LAB — URINALYSIS, ROUTINE W REFLEX MICROSCOPIC
Glucose, UA: NEGATIVE
Hgb urine dipstick: NEGATIVE
Ketones, ur: 15 — AB
Nitrite: NEGATIVE
Protein, ur: NEGATIVE
Specific Gravity, Urine: 1.033 — ABNORMAL HIGH
Urobilinogen, UA: 1
pH: 5.5

## 2010-11-14 LAB — ABO/RH: ABO/RH(D): A POS

## 2010-11-14 LAB — APTT: aPTT: 28

## 2011-11-24 ENCOUNTER — Encounter (HOSPITAL_COMMUNITY): Payer: Self-pay | Admitting: *Deleted

## 2011-11-24 ENCOUNTER — Emergency Department (HOSPITAL_COMMUNITY)
Admission: EM | Admit: 2011-11-24 | Discharge: 2011-11-24 | Disposition: A | Discharge status: Home / Self Care | Payer: BC Managed Care – PPO | Attending: Emergency Medicine | Admitting: Emergency Medicine

## 2011-11-24 ENCOUNTER — Emergency Department (HOSPITAL_COMMUNITY): Payer: BC Managed Care – PPO

## 2011-11-24 DIAGNOSIS — X58XXXA Exposure to other specified factors, initial encounter: Secondary | ICD-10-CM | POA: Insufficient documentation

## 2011-11-24 DIAGNOSIS — I10 Essential (primary) hypertension: Secondary | ICD-10-CM | POA: Insufficient documentation

## 2011-11-24 DIAGNOSIS — Z96659 Presence of unspecified artificial knee joint: Secondary | ICD-10-CM | POA: Insufficient documentation

## 2011-11-24 DIAGNOSIS — Z86718 Personal history of other venous thrombosis and embolism: Secondary | ICD-10-CM | POA: Insufficient documentation

## 2011-11-24 DIAGNOSIS — M25559 Pain in unspecified hip: Secondary | ICD-10-CM | POA: Insufficient documentation

## 2011-11-24 DIAGNOSIS — S72109A Unspecified trochanteric fracture of unspecified femur, initial encounter for closed fracture: Secondary | ICD-10-CM | POA: Insufficient documentation

## 2011-11-24 DIAGNOSIS — Z96649 Presence of unspecified artificial hip joint: Secondary | ICD-10-CM | POA: Insufficient documentation

## 2011-11-24 DIAGNOSIS — S72102A Unspecified trochanteric fracture of left femur, initial encounter for closed fracture: Secondary | ICD-10-CM

## 2011-11-24 DIAGNOSIS — Z86711 Personal history of pulmonary embolism: Secondary | ICD-10-CM | POA: Insufficient documentation

## 2011-11-24 HISTORY — DX: Other chronic pain: G89.29

## 2011-11-24 HISTORY — DX: Essential (primary) hypertension: I10

## 2011-11-24 LAB — CBC WITH DIFFERENTIAL/PLATELET
HCT: 27.3 % — ABNORMAL LOW (ref 39.0–52.0)
Hemoglobin: 8.6 g/dL — ABNORMAL LOW (ref 13.0–17.0)
Lymphocytes Relative: 21 % (ref 12–46)
Lymphs Abs: 1.5 10*3/uL (ref 0.7–4.0)
Monocytes Absolute: 0.7 10*3/uL (ref 0.1–1.0)
Monocytes Relative: 9 % (ref 3–12)
Neutro Abs: 5.1 10*3/uL (ref 1.7–7.7)
RBC: 3.29 MIL/uL — ABNORMAL LOW (ref 4.22–5.81)
WBC: 7.4 10*3/uL (ref 4.0–10.5)

## 2011-11-24 LAB — COMPREHENSIVE METABOLIC PANEL
Alkaline Phosphatase: 74 U/L (ref 39–117)
BUN: 20 mg/dL (ref 6–23)
CO2: 25 mEq/L (ref 19–32)
Chloride: 100 mEq/L (ref 96–112)
Creatinine, Ser: 1.16 mg/dL (ref 0.50–1.35)
GFR calc non Af Amer: 67 mL/min — ABNORMAL LOW (ref >90)
Total Bilirubin: 0.1 mg/dL — ABNORMAL LOW (ref 0.3–1.2)

## 2011-11-24 MED ORDER — ONDANSETRON HCL 4 MG/2ML IJ SOLN
4.0000 mg | Freq: Once | INTRAMUSCULAR | Status: AC
  Administered 2011-11-24: 4 mg via INTRAVENOUS
  Filled 2011-11-24: qty 2

## 2011-11-24 MED ORDER — HYDROCODONE-ACETAMINOPHEN 5-325 MG PO TABS
1.0000 | ORAL_TABLET | ORAL | Status: DC | PRN
Start: 2011-11-24 — End: 2011-11-24

## 2011-11-24 MED ORDER — HYDROMORPHONE HCL PF 1 MG/ML IJ SOLN
1.0000 mg | Freq: Once | INTRAMUSCULAR | Status: AC
  Administered 2011-11-24: 1 mg via INTRAVENOUS
  Filled 2011-11-24: qty 1

## 2011-11-24 MED ORDER — HYDROMORPHONE HCL PF 1 MG/ML IJ SOLN: 1.0000 mg | Freq: Once | INTRAMUSCULAR | Status: DC

## 2011-11-24 MED ORDER — ONDANSETRON HCL 4 MG PO TABS: 4.0000 mg | ORAL_TABLET | Freq: Four times a day (QID) | ORAL | Status: DC

## 2011-11-24 MED ORDER — HYDROCODONE-ACETAMINOPHEN 5-325 MG PO TABS: 1.0000 | ORAL_TABLET | ORAL | Status: AC | PRN

## 2011-11-24 NOTE — ED Provider Notes (Addendum)
History     CSN: [id number]  Arrival date & time 11/24/11  0038   First MD Initiated Contact with Patient 11/24/11 0102      Chief Complaint  Patient presents with  . Hip Pain  . Fall    (Consider location/radiation/quality/duration/timing/severity/associated sxs/prior treatment) HPI  Douglas [name] is a 61 y.o. male brought in by ambulance, who presents to the Emergency Department complaining of left hip pain after a fall on asphalt when his dog pulled him over. Patient with a complicated PMH having had bilateral hip and knee replacements x 2 each, the last done simultaneously after contracting an infection (2 years ago). He had been given a steroid injection for chronic back pain, developed a systemic sepsis requiring the removal and replacement of both hips and knee prosthetics. 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 He has been on prolonged antibiotic therapy and continues on Septra BID. He has had back surgery x 2 in the past. 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 He is on methadone 10 mg alternating with  2 hydrocodone 10/325 every 4 hours, lyrica 75mg  BID and coumadin 5 mg daily except on Mondays to maintain an INR of 2.5-3.  Patient was in heating and air conditioning business. Now consulting only.  PCP Dr. Estella Husk [facility]  Past Medical History  Diagnosis Date  . Hypertension   . Chronic pain     Past Surgical History  Procedure Date  . Back surgery     x 3  . Total hip arthroplasty     bilateral  . Replacement total knee bilateral     History reviewed. No pertinent family history.  History  Substance Use Topics  . Smoking status: Former Games developer  . Smokeless tobacco: Not on file  . Alcohol Use: No      Review of Systems  Constitutional: Negative for fever.       10 Systems  reviewed and are negative for acute change except as noted in the HPI.  HENT: Negative for congestion.   Eyes: Negative for discharge and redness.  Respiratory: Negative for cough and shortness of breath.   Cardiovascular: Negative for chest pain.  Gastrointestinal: Negative for vomiting and abdominal pain.  Musculoskeletal: Negative for back pain.       Left hip pain  Skin: Negative for rash.  Neurological: Negative for syncope, numbness and headaches.  Psychiatric/Behavioral:       No behavior change.    Allergies  Penicillins  Home Medications  No current outpatient prescriptions on file.  BP 109/37  Pulse 66  Temp 98.8 F (37.1 C) (Oral)  Resp 18  Ht 6' (1.829 m)  Wt 215 lb (97.523 kg)  BMI 29.16 kg/m2  SpO2 97%  Physical Exam  Nursing note and vitals reviewed. Constitutional:       Awake, alert, nontoxic appearance.  HENT:  Head: Atraumatic.  Eyes: Right eye exhibits no discharge. Left eye exhibits no discharge.  Neck: Neck supple.  Pulmonary/Chest: Effort normal. He exhibits no tenderness.  Abdominal: Soft. There is no tenderness. There is no rebound.  Musculoskeletal: He exhibits no tenderness.       Baseline ROM, no obvious new focal weakness.FROM to left hip. Tenderness to palpation , no deformity. Well healed scars to both hips and knees from previous surgeries.  Neurological:  Mental status and motor strength appears baseline for patient and situation.  Skin: No rash noted.       Small abrasions to both knees.  Psychiatric: He has a normal mood and affect.    ED Course  Procedures (including critical care time) Results for orders placed during the hospital encounter of 11/24/11  CBC WITH DIFFERENTIAL      Component Value Range   WBC 7.4  4.0 - 10.5 K/uL   RBC 3.29 (*) 4.22 - 5.81 MIL/uL   Hemoglobin 8.6 (*) 13.0 - 17.0 g/dL   HCT 16.1 (*) 09.6 - 04.5 %   MCV 83.0  78.0 - 100.0 fL   MCH 26.1  26.0 - 34.0 pg   MCHC 31.5  30.0 - 36.0 g/dL    RDW 40.9 (*) 81.1 - 15.5 %   Platelets 290  150 - 400 K/uL   Neutrophils Relative 68  43 - 77 %   Neutro Abs 5.1  1.7 - 7.7 K/uL   Lymphocytes Relative 21  12 - 46 %   Lymphs Abs 1.5  0.7 - 4.0 K/uL   Monocytes Relative 9  3 - 12 %   Monocytes Absolute 0.7  0.1 - 1.0 K/uL   Eosinophils Relative 2  0 - 5 %   Eosinophils Absolute 0.1  0.0 - 0.7 K/uL   Basophils Relative 0  0 - 1 %   Basophils Absolute 0.0  0.0 - 0.1 K/uL  COMPREHENSIVE METABOLIC PANEL      Component Value Range   Sodium 134 (*) 135 - 145 mEq/L   Potassium 4.2  3.5 - 5.1 mEq/L   Chloride 100  96 - 112 mEq/L   CO2 25  19 - 32 mEq/L   Glucose, Bld 99  70 - 99 mg/dL   BUN 20  6 - 23 mg/dL   Creatinine, Ser 9.14  0.50 - 1.35 mg/dL   Calcium 9.2  8.4 - 78.2 mg/dL   Total Protein 7.5  6.0 - 8.3 g/dL   Albumin 3.0 (*) 3.5 - 5.2 g/dL   AST 16  0 - 37 U/L   ALT 15  0 - 53 U/L   Alkaline Phosphatase 74  39 - 117 U/L   Total Bilirubin 0.1 (*) 0.3 - 1.2 mg/dL   GFR calc non Af Amer 67 (*) >90 mL/min   GFR calc Af Amer 77 (*) >90 mL/min   Dg Chest 1 View  11/24/2011  *RADIOLOGY REPORT*  Clinical Data: 61 year old male fall and left hip pain.  CHEST - 1 VIEW  Comparison: 09/15/2007.  Findings: Supine AP portable view 0100 hours.  Cardiac size and mediastinal contours are within normal limits.  Stable lung volumes.  No pneumothorax, pulmonary edema, pleural effusion or confluent pulmonary opacity.  Chronic right lateral rib fractures. Advanced degenerative changes at the right glenohumeral joint.  IMPRESSION: No acute cardiopulmonary abnormality.   Original Report Authenticated By: Harley Hallmark, M.D.    Dg Hip Complete Left  11/24/2011  *RADIOLOGY REPORT*  Clinical Data: 61 year old male fall and left hip pain.  Bilateral hip replacement.  LEFT HIP - COMPLETE 2+ VIEW  Comparison: None.  Findings: Right hip arthroplasty partially visible.  Left bipolar hip arthroplasty.  Metallic components appear intact and normally aligned.   However, there is a comminuted fracture through the greater trochanter with mild displacement of comminution fragments.  The pelvis appears intact.  Advanced lower lumbar degenerative changes.  IMPRESSION: Comminuted fracture through the left proximal  femur greater trochanter.  Underlying left hip arthroplasty appears intact.   Original Report Authenticated By: Harley Hallmark, M.D.     Date: 11/24/2011  0108  Rate: 63  Rhythm: normal sinus rhythm  QRS Axis: Left axix deviation  Intervals: normal  ST/T Wave abnormalities: normal  Conduction Disutrbances: none  Narrative Interpretation: no prior to compare  0213 T/C to Dr. Hilda Lias, orthopedist, case discussed, including:  HPI, pertinent PM/SHx, VS/PE, dx testing, ED course and treatment. He will review the films and call me back. 0217 T/C from Dr. Hilda Lias. He has reviewed films. The trochanter fracture may require surgery. Due to h/o infection and on going antibiotics, the management may be watching and waiting.      MDM  Patiet with complicated PMH and bilateral hip and knee replacements here having fallen. Xrays show a trochanter fracture on the left. Given analgesics with improvement in discomfort. Spoke with Dr. Hilda Lias, orthopedist who advised that patient will most likely need follow up with orthopedists at [facility] due to ongoing treatment for infection, anticoagulation, and potential for complications with surgery. Dx testing d/w pt and family.  Questions answered.  Verb understanding.Patient and wife want him to go home with crutches, non weight bearing. They will be in touch with [facility] surgeons tomorrow.He has pain management medicines, walker, orthopedic aides at home for mobility. Patient able to ambulate on crutches in the department. Pt stable in ED with no significant deterioration in condition.The patient appears reasonably screened and/or stabilized for discharge and I doubt any other medical condition or other [facility] requiring further  screening, evaluation, or treatment in the ED at this time prior to discharge.  MDM Reviewed: nursing note and vitals Interpretation: labs, ECG and x-ray Consults: orthopedics            Camilah Spillman S. Colon Branch, MD 11/24/11 5621  Nicoletta Dress. Colon Branch, MD 12/17/11 [phone]

## 2011-11-24 NOTE — ED Notes (Signed)
Pt discharged. Pt stable at time of discharge. Medications reviewed pt has no questions regarding discharge at this time. Pt voiced understanding of discharge instructions.  

## 2011-11-24 NOTE — ED Notes (Addendum)
Pt arrives from home via ems d/t hip pain related to fall. Pt states he was walking his dog and it caused him to fall landing on concrete. nad at this time. Left hip tender to touch. ems reports pt was given 50 of fentanyl iv in route.

## 2011-12-01 ENCOUNTER — Ambulatory Visit (HOSPITAL_COMMUNITY)
Admission: RE | Admit: 2011-12-01 | Discharge: 2011-12-01 | Disposition: A | Discharge status: Home / Self Care | Payer: BC Managed Care – PPO | Source: Ambulatory Visit | Attending: Orthopedic Surgery | Admitting: Orthopedic Surgery

## 2011-12-01 ENCOUNTER — Other Ambulatory Visit (HOSPITAL_COMMUNITY): Payer: Self-pay | Admitting: Orthopedic Surgery

## 2011-12-01 DIAGNOSIS — Z96649 Presence of unspecified artificial hip joint: Secondary | ICD-10-CM | POA: Insufficient documentation

## 2011-12-01 DIAGNOSIS — S72009A Fracture of unspecified part of neck of unspecified femur, initial encounter for closed fracture: Secondary | ICD-10-CM

## 2011-12-01 DIAGNOSIS — W19XXXA Unspecified fall, initial encounter: Secondary | ICD-10-CM | POA: Insufficient documentation

## 2011-12-08 ENCOUNTER — Ambulatory Visit (HOSPITAL_COMMUNITY)
Admission: RE | Admit: 2011-12-08 | Discharge: 2011-12-08 | Disposition: A | Discharge status: Home / Self Care | Payer: BC Managed Care – PPO | Source: Ambulatory Visit | Attending: Orthopedic Surgery | Admitting: Orthopedic Surgery

## 2011-12-08 ENCOUNTER — Other Ambulatory Visit (HOSPITAL_COMMUNITY): Payer: Self-pay | Admitting: Orthopedic Surgery

## 2011-12-08 DIAGNOSIS — Z96649 Presence of unspecified artificial hip joint: Secondary | ICD-10-CM | POA: Insufficient documentation

## 2011-12-08 DIAGNOSIS — X58XXXA Exposure to other specified factors, initial encounter: Secondary | ICD-10-CM | POA: Insufficient documentation

## 2011-12-08 DIAGNOSIS — T148XXA Other injury of unspecified body region, initial encounter: Secondary | ICD-10-CM | POA: Insufficient documentation

## 2011-12-15 ENCOUNTER — Ambulatory Visit (HOSPITAL_COMMUNITY)
Admission: RE | Admit: 2011-12-15 | Discharge: 2011-12-15 | Disposition: A | Discharge status: Home / Self Care | Payer: BC Managed Care – PPO | Source: Ambulatory Visit | Attending: Orthopedic Surgery | Admitting: Orthopedic Surgery

## 2011-12-15 ENCOUNTER — Other Ambulatory Visit (HOSPITAL_COMMUNITY): Payer: Self-pay | Admitting: Orthopedic Surgery

## 2011-12-15 DIAGNOSIS — T148XXA Other injury of unspecified body region, initial encounter: Secondary | ICD-10-CM

## 2011-12-15 DIAGNOSIS — Z4789 Encounter for other orthopedic aftercare: Secondary | ICD-10-CM | POA: Insufficient documentation

## 2011-12-22 ENCOUNTER — Other Ambulatory Visit (HOSPITAL_COMMUNITY): Payer: Self-pay | Admitting: Orthopedic Surgery

## 2011-12-22 ENCOUNTER — Ambulatory Visit (HOSPITAL_COMMUNITY)
Admission: RE | Admit: 2011-12-22 | Discharge: 2011-12-22 | Disposition: A | Discharge status: Home / Self Care | Payer: BC Managed Care – PPO | Source: Ambulatory Visit | Attending: Orthopedic Surgery | Admitting: Orthopedic Surgery

## 2011-12-22 DIAGNOSIS — Z4789 Encounter for other orthopedic aftercare: Secondary | ICD-10-CM | POA: Insufficient documentation

## 2011-12-22 DIAGNOSIS — T148XXA Other injury of unspecified body region, initial encounter: Secondary | ICD-10-CM

## 2012-01-05 ENCOUNTER — Ambulatory Visit (HOSPITAL_COMMUNITY)
Admission: RE | Admit: 2012-01-05 | Discharge: 2012-01-05 | Disposition: A | Discharge status: Home / Self Care | Payer: BC Managed Care – PPO | Source: Ambulatory Visit | Attending: Orthopedic Surgery | Admitting: Orthopedic Surgery

## 2012-01-05 ENCOUNTER — Other Ambulatory Visit (HOSPITAL_COMMUNITY): Payer: Self-pay | Admitting: Orthopedic Surgery

## 2012-01-05 DIAGNOSIS — Z96649 Presence of unspecified artificial hip joint: Secondary | ICD-10-CM | POA: Insufficient documentation

## 2012-01-05 DIAGNOSIS — T148XXA Other injury of unspecified body region, initial encounter: Secondary | ICD-10-CM

## 2012-01-05 DIAGNOSIS — Z4789 Encounter for other orthopedic aftercare: Secondary | ICD-10-CM | POA: Insufficient documentation

## 2012-04-07 ENCOUNTER — Other Ambulatory Visit (HOSPITAL_COMMUNITY): Payer: Self-pay | Admitting: Orthopedic Surgery

## 2012-04-11 ENCOUNTER — Encounter (HOSPITAL_COMMUNITY): Payer: BC Managed Care – PPO

## 2012-06-20 DIAGNOSIS — G8929 Other chronic pain: Secondary | ICD-10-CM | POA: Insufficient documentation

## 2012-06-20 DIAGNOSIS — T8484XA Pain due to internal orthopedic prosthetic devices, implants and grafts, initial encounter: Secondary | ICD-10-CM | POA: Insufficient documentation

## 2012-06-20 DIAGNOSIS — Z96659 Presence of unspecified artificial knee joint: Secondary | ICD-10-CM | POA: Insufficient documentation

## 2012-06-20 DIAGNOSIS — I82409 Acute embolism and thrombosis of unspecified deep veins of unspecified lower extremity: Secondary | ICD-10-CM | POA: Insufficient documentation

## 2012-06-20 DIAGNOSIS — Z96643 Presence of artificial hip joint, bilateral: Secondary | ICD-10-CM | POA: Insufficient documentation

## 2012-08-17 DIAGNOSIS — Z89529 Acquired absence of unspecified knee: Secondary | ICD-10-CM | POA: Insufficient documentation

## 2012-08-24 DIAGNOSIS — M0000 Staphylococcal arthritis, unspecified joint: Secondary | ICD-10-CM | POA: Insufficient documentation

## 2013-12-27 IMAGING — CR DG HIP (WITH OR WITHOUT PELVIS) 2-3V*L*
3 series · 3 of 3 positions shown · non-contrast
Comparison: 12/08/2011

CLINICAL DATA: Follow up fracture

LEFT HIP - COMPLETE 2+ VIEW

[view not recorded (1 of 3)]
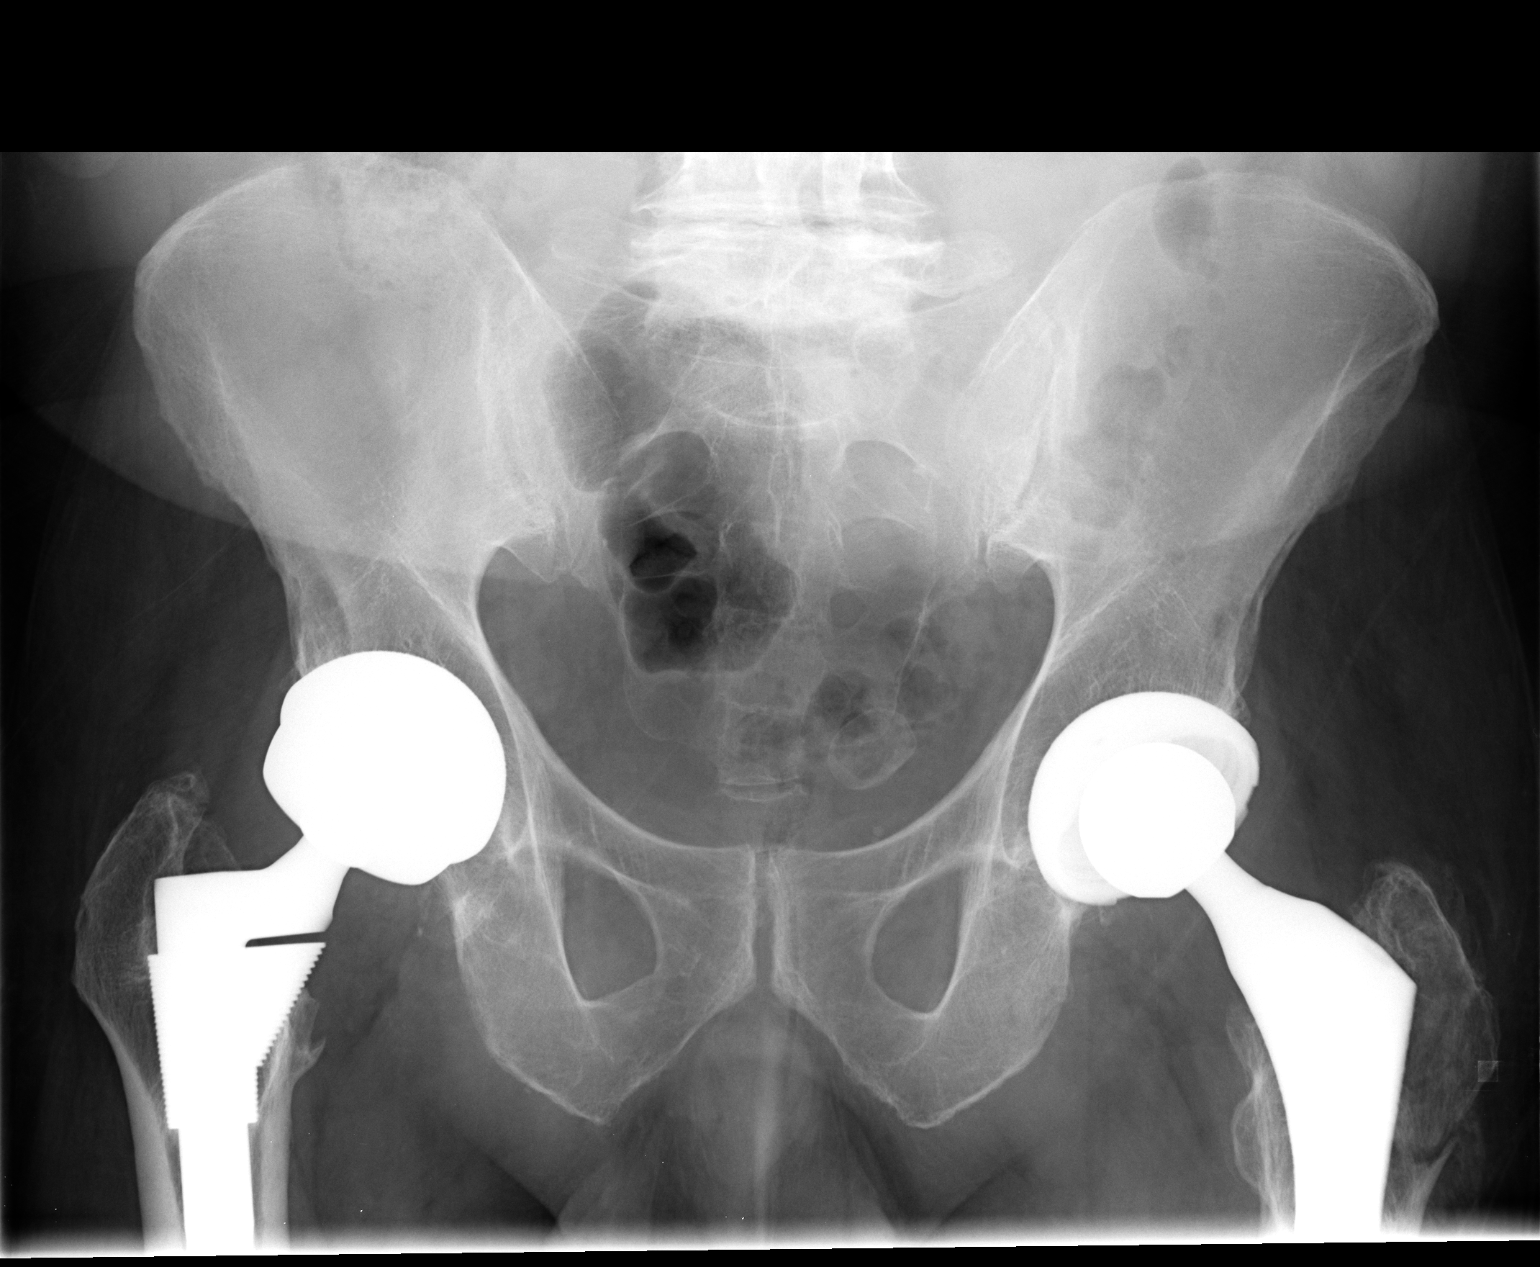

[view not recorded (2 of 3)]
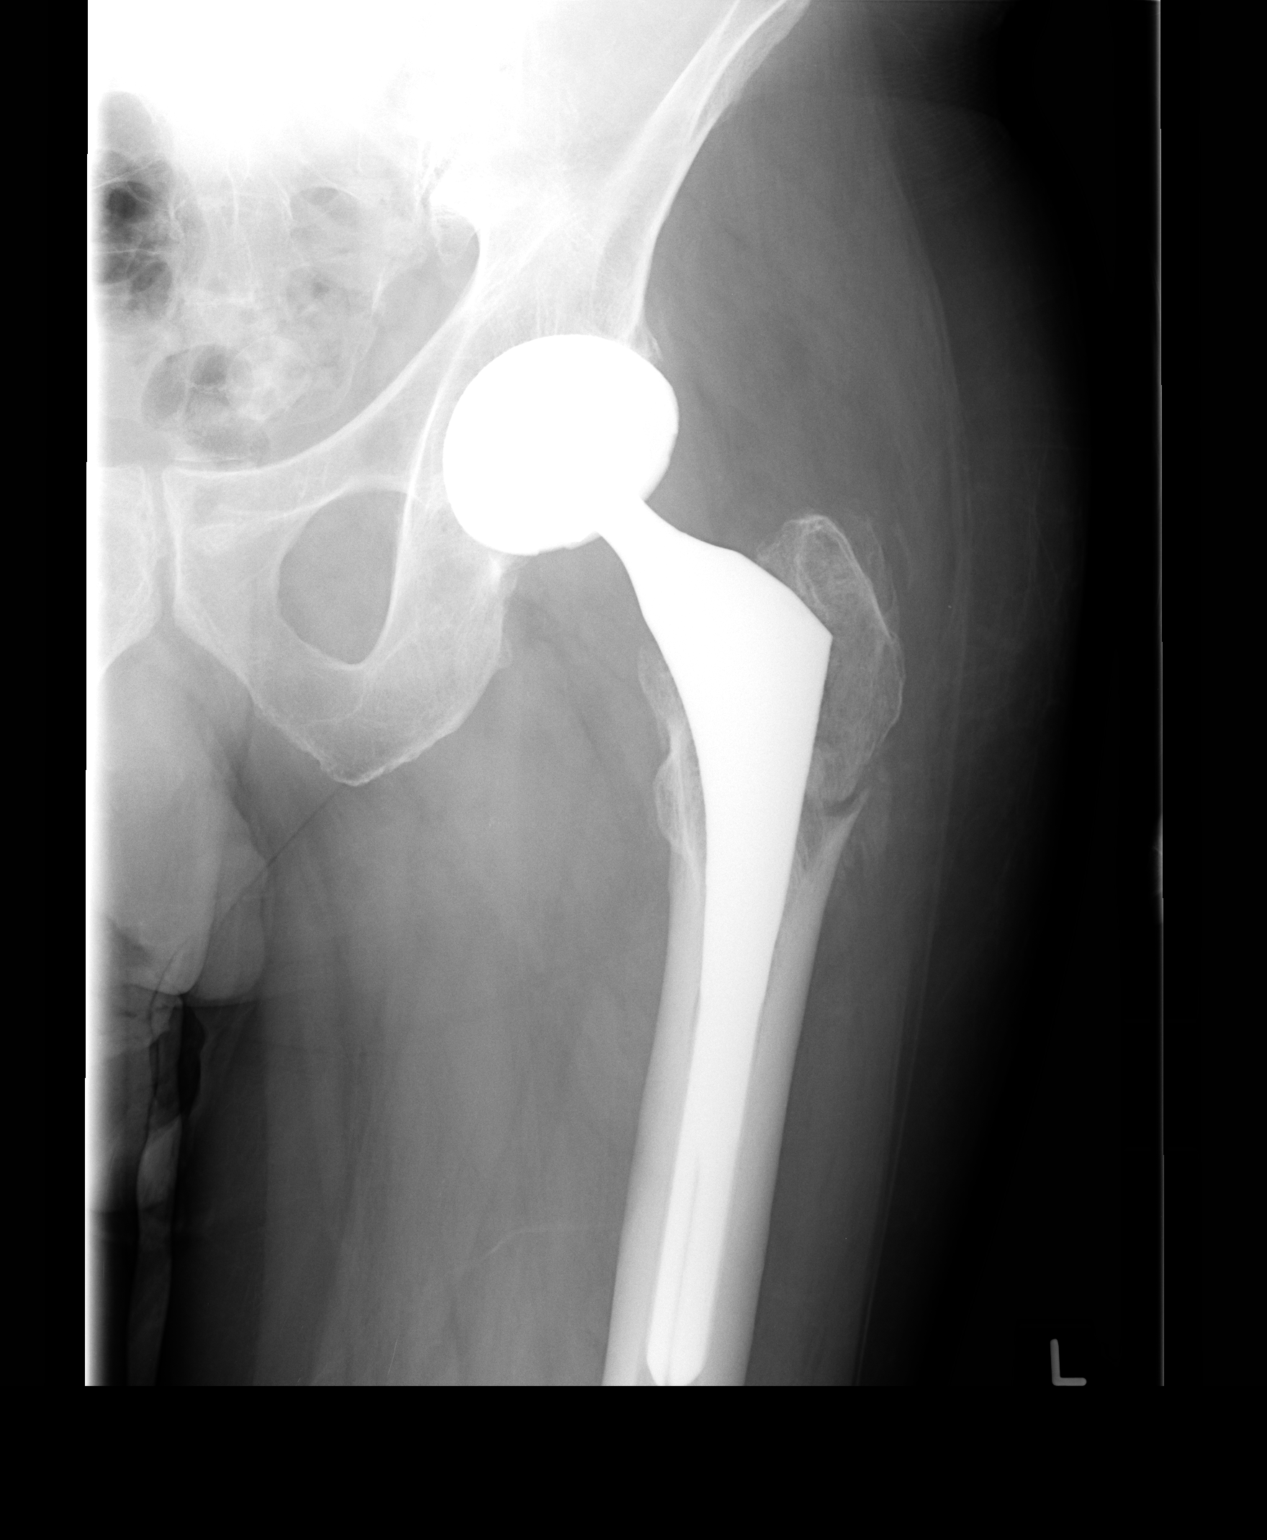

[view not recorded (3 of 3)]
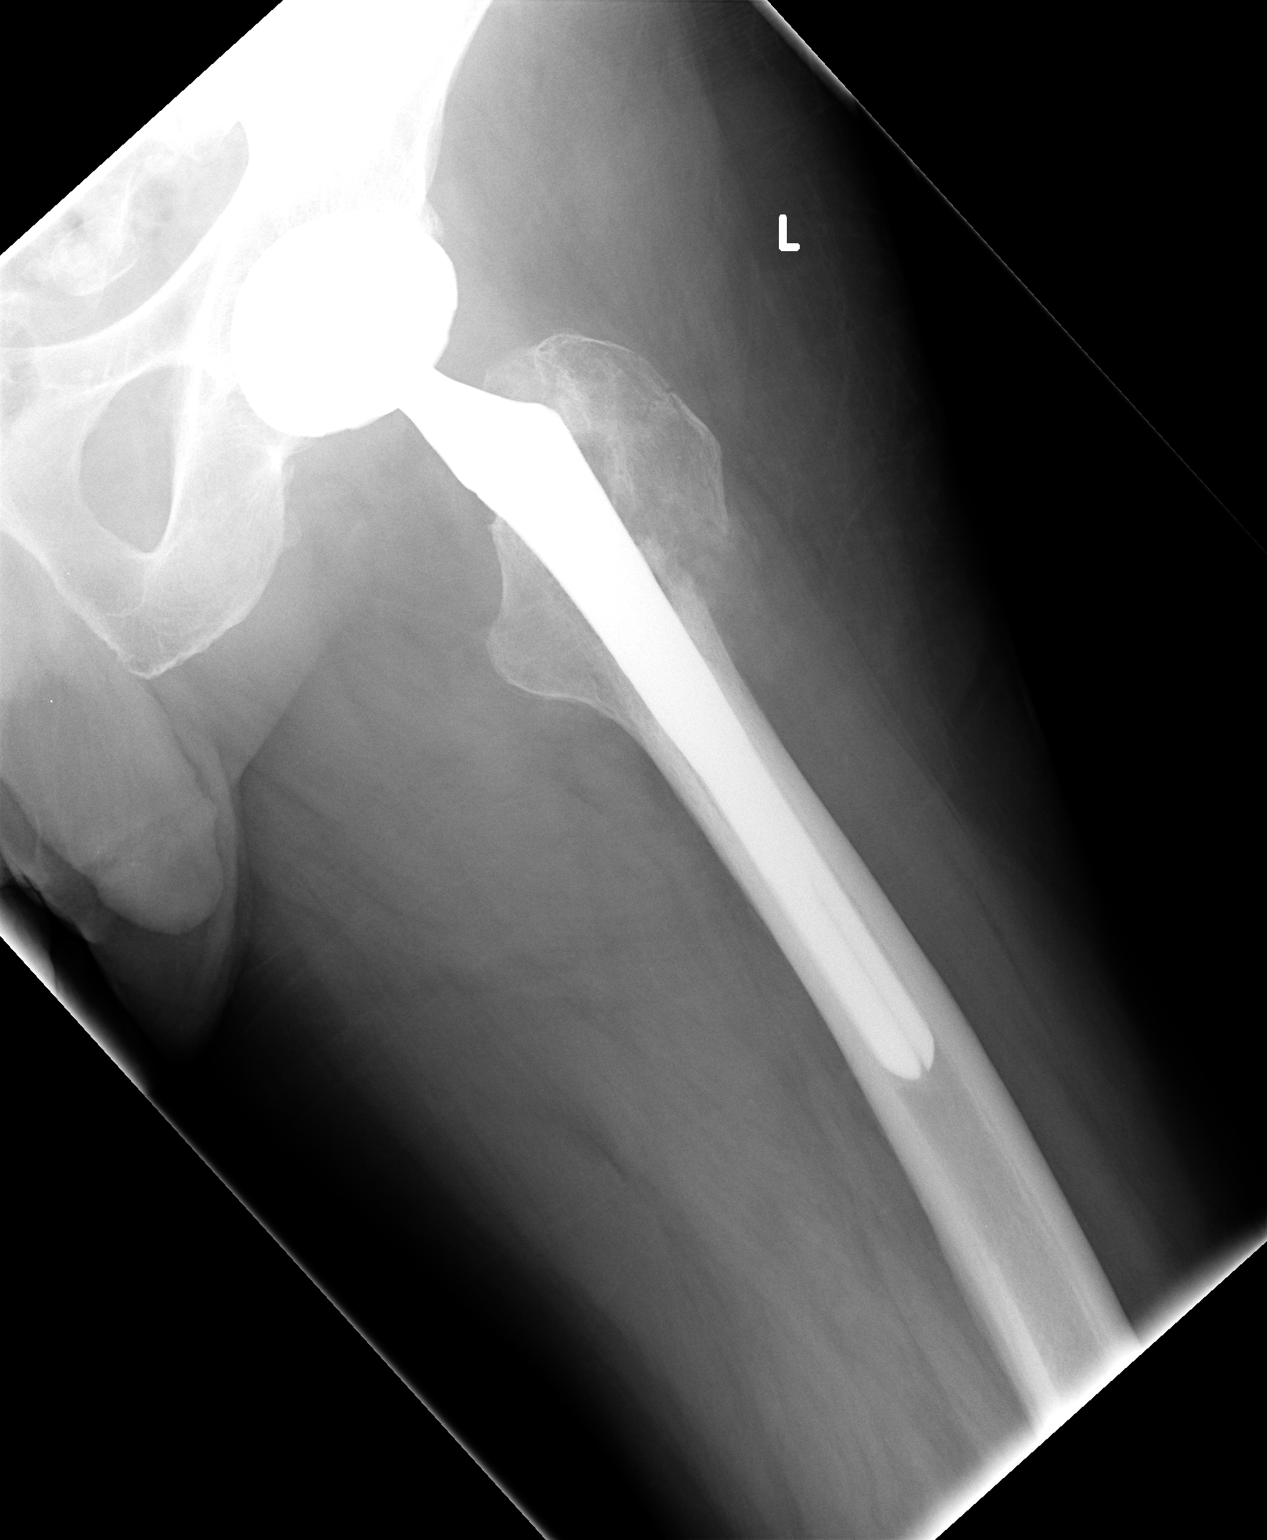

[3 of 3 positions shown; findings below may reference images not displayed]

FINDINGS: Three views of the left hip submitted.  Bilateral hip
prosthesis again noted.  Stable mild displaced fracture of proximal
left femur along the lateral aspect of prosthesis.  Again noted
about 5 mm of distraction of the fracture fragments.
IMPRESSION: Stable mild displaced fracture of proximal left femur.  Bilateral
hip prosthesis again noted.

## 2014-01-24 DIAGNOSIS — C4491 Basal cell carcinoma of skin, unspecified: Secondary | ICD-10-CM

## 2014-01-24 HISTORY — DX: Basal cell carcinoma of skin, unspecified: C44.91

## 2014-02-12 DIAGNOSIS — Z5189 Encounter for other specified aftercare: Secondary | ICD-10-CM | POA: Insufficient documentation

## 2020-02-19 ENCOUNTER — Emergency Department (HOSPITAL_COMMUNITY): Payer: Medicare Other

## 2020-02-19 ENCOUNTER — Other Ambulatory Visit: Payer: Self-pay

## 2020-02-19 ENCOUNTER — Inpatient Hospital Stay (HOSPITAL_COMMUNITY)
Admission: EM | Admit: 2020-02-19 | Discharge: 2020-02-28 | DRG: 177 | Disposition: A | Discharge status: Skilled Nursing Facility | Payer: Medicare Other | Attending: Internal Medicine | Admitting: Internal Medicine

## 2020-02-19 DIAGNOSIS — I4891 Unspecified atrial fibrillation: Secondary | ICD-10-CM | POA: Diagnosis present

## 2020-02-19 DIAGNOSIS — R195 Other fecal abnormalities: Secondary | ICD-10-CM | POA: Diagnosis not present

## 2020-02-19 DIAGNOSIS — D7589 Other specified diseases of blood and blood-forming organs: Secondary | ICD-10-CM | POA: Diagnosis present

## 2020-02-19 DIAGNOSIS — Z96643 Presence of artificial hip joint, bilateral: Secondary | ICD-10-CM | POA: Diagnosis present

## 2020-02-19 DIAGNOSIS — Z86711 Personal history of pulmonary embolism: Secondary | ICD-10-CM

## 2020-02-19 DIAGNOSIS — D696 Thrombocytopenia, unspecified: Secondary | ICD-10-CM | POA: Diagnosis present

## 2020-02-19 DIAGNOSIS — Z8249 Family history of ischemic heart disease and other diseases of the circulatory system: Secondary | ICD-10-CM

## 2020-02-19 DIAGNOSIS — Z880 Allergy status to penicillin: Secondary | ICD-10-CM

## 2020-02-19 DIAGNOSIS — Z87891 Personal history of nicotine dependence: Secondary | ICD-10-CM

## 2020-02-19 DIAGNOSIS — G3184 Mild cognitive impairment, so stated: Secondary | ICD-10-CM | POA: Diagnosis present

## 2020-02-19 DIAGNOSIS — D75839 Thrombocytosis, unspecified: Secondary | ICD-10-CM | POA: Diagnosis present

## 2020-02-19 DIAGNOSIS — Z283 Underimmunization status: Secondary | ICD-10-CM | POA: Diagnosis not present

## 2020-02-19 DIAGNOSIS — E669 Obesity, unspecified: Secondary | ICD-10-CM | POA: Diagnosis present

## 2020-02-19 DIAGNOSIS — E860 Dehydration: Secondary | ICD-10-CM | POA: Diagnosis present

## 2020-02-19 DIAGNOSIS — I10 Essential (primary) hypertension: Secondary | ICD-10-CM | POA: Diagnosis present

## 2020-02-19 DIAGNOSIS — Z885 Allergy status to narcotic agent status: Secondary | ICD-10-CM

## 2020-02-19 DIAGNOSIS — Z86718 Personal history of other venous thrombosis and embolism: Secondary | ICD-10-CM

## 2020-02-19 DIAGNOSIS — Z79899 Other long term (current) drug therapy: Secondary | ICD-10-CM

## 2020-02-19 DIAGNOSIS — R339 Retention of urine, unspecified: Secondary | ICD-10-CM | POA: Diagnosis present

## 2020-02-19 DIAGNOSIS — M069 Rheumatoid arthritis, unspecified: Secondary | ICD-10-CM | POA: Diagnosis present

## 2020-02-19 DIAGNOSIS — J9601 Acute respiratory failure with hypoxia: Secondary | ICD-10-CM | POA: Diagnosis present

## 2020-02-19 DIAGNOSIS — E785 Hyperlipidemia, unspecified: Secondary | ICD-10-CM | POA: Diagnosis present

## 2020-02-19 DIAGNOSIS — Z86010 Personal history of colonic polyps: Secondary | ICD-10-CM

## 2020-02-19 DIAGNOSIS — G894 Chronic pain syndrome: Secondary | ICD-10-CM | POA: Diagnosis present

## 2020-02-19 DIAGNOSIS — U071 COVID-19: Secondary | ICD-10-CM | POA: Diagnosis present

## 2020-02-19 DIAGNOSIS — Z881 Allergy status to other antibiotic agents status: Secondary | ICD-10-CM

## 2020-02-19 DIAGNOSIS — R54 Age-related physical debility: Secondary | ICD-10-CM | POA: Diagnosis present

## 2020-02-19 DIAGNOSIS — I2699 Other pulmonary embolism without acute cor pulmonale: Secondary | ICD-10-CM | POA: Diagnosis present

## 2020-02-19 DIAGNOSIS — M25552 Pain in left hip: Secondary | ICD-10-CM | POA: Diagnosis present

## 2020-02-19 DIAGNOSIS — Z96653 Presence of artificial knee joint, bilateral: Secondary | ICD-10-CM | POA: Diagnosis present

## 2020-02-19 DIAGNOSIS — M170 Bilateral primary osteoarthritis of knee: Secondary | ICD-10-CM | POA: Diagnosis present

## 2020-02-19 DIAGNOSIS — H548 Legal blindness, as defined in USA: Secondary | ICD-10-CM | POA: Diagnosis present

## 2020-02-19 DIAGNOSIS — R3129 Other microscopic hematuria: Secondary | ICD-10-CM | POA: Diagnosis present

## 2020-02-19 DIAGNOSIS — Z85828 Personal history of other malignant neoplasm of skin: Secondary | ICD-10-CM

## 2020-02-19 DIAGNOSIS — R7989 Other specified abnormal findings of blood chemistry: Secondary | ICD-10-CM | POA: Diagnosis not present

## 2020-02-19 DIAGNOSIS — F05 Delirium due to known physiological condition: Secondary | ICD-10-CM | POA: Diagnosis present

## 2020-02-19 DIAGNOSIS — Z6831 Body mass index (BMI) 31.0-31.9, adult: Secondary | ICD-10-CM | POA: Diagnosis not present

## 2020-02-19 DIAGNOSIS — N179 Acute kidney failure, unspecified: Secondary | ICD-10-CM | POA: Diagnosis present

## 2020-02-19 DIAGNOSIS — J1282 Pneumonia due to coronavirus disease 2019: Secondary | ICD-10-CM | POA: Diagnosis present

## 2020-02-19 DIAGNOSIS — R338 Other retention of urine: Secondary | ICD-10-CM | POA: Diagnosis present

## 2020-02-19 DIAGNOSIS — G9341 Metabolic encephalopathy: Secondary | ICD-10-CM | POA: Diagnosis present

## 2020-02-19 DIAGNOSIS — R0902 Hypoxemia: Secondary | ICD-10-CM

## 2020-02-19 DIAGNOSIS — R197 Diarrhea, unspecified: Secondary | ICD-10-CM | POA: Diagnosis present

## 2020-02-19 LAB — CBC
HCT: 47.7 % (ref 39.0–52.0)
Hemoglobin: 16.1 g/dL (ref 13.0–17.0)
MCH: 34 pg (ref 26.0–34.0)
MCHC: 33.8 g/dL (ref 30.0–36.0)
MCV: 100.6 fL — ABNORMAL HIGH (ref 80.0–100.0)
Platelets: 86 10*3/uL — ABNORMAL LOW (ref 150–400)
RBC: 4.74 MIL/uL (ref 4.22–5.81)
RDW: 12.9 % (ref 11.5–15.5)
WBC: 9.1 10*3/uL (ref 4.0–10.5)
nRBC: 1.3 % — ABNORMAL HIGH (ref 0.0–0.2)

## 2020-02-19 LAB — COMPREHENSIVE METABOLIC PANEL
ALT: 17 U/L (ref 0–44)
AST: 33 U/L (ref 15–41)
Albumin: 3.5 g/dL (ref 3.5–5.0)
Alkaline Phosphatase: 35 U/L — ABNORMAL LOW (ref 38–126)
Anion gap: 16 — ABNORMAL HIGH (ref 5–15)
BUN: 113 mg/dL — ABNORMAL HIGH (ref 8–23)
CO2: 24 mmol/L (ref 22–32)
Calcium: 8.9 mg/dL (ref 8.9–10.3)
Chloride: 97 mmol/L — ABNORMAL LOW (ref 98–111)
Creatinine, Ser: 2.8 mg/dL — ABNORMAL HIGH (ref 0.61–1.24)
GFR, Estimated: 24 mL/min — ABNORMAL LOW (ref >60)
Glucose, Bld: 143 mg/dL — ABNORMAL HIGH (ref 70–99)
Potassium: 4.3 mmol/L (ref 3.5–5.1)
Sodium: 137 mmol/L (ref 135–145)
Total Bilirubin: 0.5 mg/dL (ref 0.3–1.2)
Total Protein: 7 g/dL (ref 6.5–8.1)

## 2020-02-19 LAB — DIC (DISSEMINATED INTRAVASCULAR COAGULATION)PANEL
D-Dimer, Quant: 12.19 ug/mL-FEU — ABNORMAL HIGH (ref 0.00–0.50)
Fibrinogen: 317 mg/dL (ref 210–475)
INR: 1.1 (ref 0.8–1.2)
Platelets: 87 10*3/uL — ABNORMAL LOW (ref 150–400)
Prothrombin Time: 13.7 seconds (ref 11.4–15.2)
Smear Review: NONE SEEN
aPTT: 30 seconds (ref 24–36)

## 2020-02-19 LAB — FERRITIN: Ferritin: 452 ng/mL — ABNORMAL HIGH (ref 24–336)

## 2020-02-19 LAB — C-REACTIVE PROTEIN: CRP: 2.3 mg/dL — ABNORMAL HIGH (ref <1.0)

## 2020-02-19 LAB — HIV ANTIBODY (ROUTINE TESTING W REFLEX): HIV Screen 4th Generation wRfx: NONREACTIVE

## 2020-02-19 LAB — LACTATE DEHYDROGENASE: LDH: 366 U/L — ABNORMAL HIGH (ref 98–192)

## 2020-02-19 LAB — LACTIC ACID, PLASMA
Lactic Acid, Venous: 1.9 mmol/L (ref 0.5–1.9)
Lactic Acid, Venous: 1.6 mmol/L (ref 0.5–1.9)

## 2020-02-19 LAB — TRIGLYCERIDES: Triglycerides: 244 mg/dL — ABNORMAL HIGH (ref <150)

## 2020-02-19 LAB — LIPASE, BLOOD: Lipase: 79 U/L — ABNORMAL HIGH (ref 11–51)

## 2020-02-19 LAB — D-DIMER, QUANTITATIVE: D-Dimer, Quant: 12.11 ug/mL-FEU — ABNORMAL HIGH (ref 0.00–0.50)

## 2020-02-19 LAB — FIBRINOGEN: Fibrinogen: 321 mg/dL (ref 210–475)

## 2020-02-19 LAB — PROCALCITONIN: Procalcitonin: 0.16 ng/mL

## 2020-02-19 MED ORDER — SODIUM CHLORIDE 0.9 % IV BOLUS
500.0000 mL | Freq: Once | INTRAVENOUS | Status: AC
  Administered 2020-02-19: 500 mL via INTRAVENOUS

## 2020-02-19 MED ORDER — SODIUM CHLORIDE 0.9 % IV SOLN
100.0000 mg | Freq: Every day | INTRAVENOUS | Status: AC
  Administered 2020-02-20 – 2020-02-23 (×4): 100 mg via INTRAVENOUS
  Filled 2020-02-19: qty 20
  Filled 2020-02-19: qty 100
  Filled 2020-02-19 (×3): qty 20

## 2020-02-19 MED ORDER — TIMOLOL MALEATE 0.5 % OP SOLN
1.0000 [drp] | Freq: Every day | OPHTHALMIC | Status: DC
  Administered 2020-02-20 – 2020-02-28 (×9): 1 [drp] via OPHTHALMIC
  Filled 2020-02-19: qty 5

## 2020-02-19 MED ORDER — GUAIFENESIN-DM 100-10 MG/5ML PO SYRP
10.0000 mL | ORAL_SOLUTION | ORAL | Status: DC | PRN
  Administered 2020-02-20 – 2020-02-26 (×4): 10 mL via ORAL
  Filled 2020-02-19 (×4): qty 10

## 2020-02-19 MED ORDER — HYDROCODONE-ACETAMINOPHEN 10-325 MG PO TABS
1.0000 | ORAL_TABLET | Freq: Three times a day (TID) | ORAL | Status: DC | PRN
  Administered 2020-02-20: 1 via ORAL
  Administered 2020-02-21 (×3): 2 via ORAL
  Administered 2020-02-22: 1 via ORAL
  Administered 2020-02-23 – 2020-02-26 (×6): 2 via ORAL
  Filled 2020-02-19 (×2): qty 2
  Filled 2020-02-19: qty 1
  Filled 2020-02-19 (×3): qty 2
  Filled 2020-02-19: qty 1
  Filled 2020-02-19 (×4): qty 2

## 2020-02-19 MED ORDER — PREGABALIN 25 MG PO CAPS
150.0000 mg | ORAL_CAPSULE | Freq: Two times a day (BID) | ORAL | Status: DC
  Administered 2020-02-19 – 2020-02-20 (×2): 150 mg via ORAL
  Filled 2020-02-19 (×2): qty 1

## 2020-02-19 MED ORDER — HEPARIN SODIUM (PORCINE) 5000 UNIT/ML IJ SOLN
5000.0000 [IU] | Freq: Three times a day (TID) | INTRAMUSCULAR | Status: DC
  Administered 2020-02-19 – 2020-02-20 (×3): 5000 [IU] via SUBCUTANEOUS
  Filled 2020-02-19 (×3): qty 1

## 2020-02-19 MED ORDER — BRIMONIDINE TARTRATE 0.2 % OP SOLN
1.0000 [drp] | Freq: Two times a day (BID) | OPHTHALMIC | Status: DC
  Administered 2020-02-20 – 2020-02-28 (×17): 1 [drp] via OPHTHALMIC
  Filled 2020-02-19: qty 5

## 2020-02-19 MED ORDER — DEXAMETHASONE SODIUM PHOSPHATE 10 MG/ML IJ SOLN
8.0000 mg | Freq: Once | INTRAMUSCULAR | Status: AC
  Administered 2020-02-19: 8 mg via INTRAVENOUS
  Filled 2020-02-19: qty 1

## 2020-02-19 MED ORDER — LACTATED RINGERS IV SOLN: INTRAVENOUS | Status: AC

## 2020-02-19 MED ORDER — POLYETHYLENE GLYCOL 3350 17 G PO PACK
17.0000 g | PACK | Freq: Every day | ORAL | Status: DC | PRN
  Administered 2020-02-24: 17 g via ORAL
  Filled 2020-02-19: qty 1

## 2020-02-19 MED ORDER — SODIUM CHLORIDE 0.9 % IV SOLN
200.0000 mg | Freq: Once | INTRAVENOUS | Status: AC
  Administered 2020-02-19: 200 mg via INTRAVENOUS
  Filled 2020-02-19: qty 40

## 2020-02-19 MED ORDER — DEXAMETHASONE 6 MG PO TABS
6.0000 mg | ORAL_TABLET | ORAL | Status: AC
  Administered 2020-02-20 – 2020-02-28 (×9): 6 mg via ORAL
  Filled 2020-02-19 (×7): qty 1
  Filled 2020-02-19: qty 2
  Filled 2020-02-19: qty 1

## 2020-02-19 MED ORDER — LATANOPROST 0.005 % OP SOLN
1.0000 [drp] | Freq: Every day | OPHTHALMIC | Status: DC
  Administered 2020-02-19 – 2020-02-28 (×10): 1 [drp] via OPHTHALMIC
  Filled 2020-02-19: qty 2.5

## 2020-02-19 MED ORDER — SULFASALAZINE 500 MG PO TABS
500.0000 mg | ORAL_TABLET | Freq: Two times a day (BID) | ORAL | Status: DC
  Administered 2020-02-19 – 2020-02-20 (×2): 500 mg via ORAL
  Filled 2020-02-19 (×3): qty 1

## 2020-02-19 MED ORDER — FAMOTIDINE 20 MG PO TABS
20.0000 mg | ORAL_TABLET | Freq: Two times a day (BID) | ORAL | Status: DC
  Administered 2020-02-19 – 2020-02-21 (×5): 20 mg via ORAL
  Filled 2020-02-19 (×5): qty 1

## 2020-02-19 MED ORDER — LACTATED RINGERS IV BOLUS
1000.0000 mL | Freq: Once | INTRAVENOUS | Status: AC
  Administered 2020-02-19: 1000 mL via INTRAVENOUS

## 2020-02-19 NOTE — ED Triage Notes (Signed)
Pt from home for eval of continued shob, diarrhea, sore throat since being dx with covid one week ago.

## 2020-02-19 NOTE — H&P (Cosign Needed Addendum)
Date: 02/19/2020               Patient Name:  Douglas [name] MRN: [id number]  DOB: [birth year] Age / Sex: 70 y.o., male   PCP: Elise Benne, MD         Medical Service: Internal Medicine Teaching Service         Attending Physician: Dr. Gust Rung, DO    First Contact: Dr. Alphonzo Severance Pager: [phone]  Second Contact: Dr. 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  He is unvaccinated.  States that developed symptoms of fatigue, coughing about 3-4 days ago.  He had a positive Covid test on 02/13/2020 at [facility].  He endorses poor p.o. intake and diarrhea in the last few days.  Has not take his medications in the last few days.  He denies chest pain, nausea, vomiting, headache, history of blood clots, long travel or long immobility.  No recent surgery or history of cancer. Also denies history of heart failure, and LE edema.   He has seen his PCP at [facility] a few months ago.  In the ED, patient was hypoxic at rest in the 60s on room air.  He is satting well on 3 L.  CBC shows normal white count and hemoglobin. Platelet low at 86.  CMP shows creatinine of 2.8 (baseline around 1) and BUN 113.  Pro-Cal 0.16.  CRP 2.3.  D-dimer 12.  Chest x-ray showed multifocal pneumonia.  Meds:  Current Meds  Medication Sig  . atorvastatin (LIPITOR) 40 MG tablet Take 40 mg by mouth daily.  . brimonidine (ALPHAGAN) 0.2 % ophthalmic solution Place 1 drop into both eyes in the morning and at bedtime.  .  famotidine (PEPCID) 20 MG tablet Take 20 mg by mouth 2 (two) times daily.  Marland Kitchen HYDROcodone-acetaminophen (NORCO) 10-325 MG tablet Take 1 tablet by mouth every 4 (four) hours as needed (pain).  Marland Kitchen latanoprost (XALATAN) 0.005 % ophthalmic solution Place 1 drop into both eyes at bedtime.  Marland Kitchen lisinopril-hydrochlorothiazide (ZESTORETIC) 20-12.5 MG tablet Take 1 tablet by mouth daily.  . pregabalin (LYRICA) 150 MG capsule Take 150 mg by mouth 2 (two) times daily.  Marland Kitchen sulfaSALAzine (AZULFIDINE) 500 MG tablet Take 1,000 mg by mouth 2 (two) times daily.  . timolol (TIMOPTIC) 0.5 % ophthalmic solution Place 1 drop into both eyes daily.     Allergies: Allergies as of 02/19/2020 - Review Complete 02/19/2020  Allergen Reaction Noted  . Cefazolin Rash 08/24/2012  . Morphine sulfate  02/19/2020  . Penicillins Hives 11/24/2011  . Hydrochlorothiazide Rash 08/11/2013  . Other Rash 08/11/2013  . Oxycodone Rash 02/19/2020  . Vancomycin Rash 08/22/2012   Past Medical History:  Diagnosis Date  . Basal cell carcinoma 01/24/2014   left temple tx mohs Dr Alean Rinne 03/12/2014  . Chronic pain   . Hypertension     Family History:  Mom: HTN, unknown cancer Dad: HTN Son: diabetes  Social History:  Lives with his wife Denies alcohol, smoking or recreational drug use  Review of Systems: A complete ROS was negative except as per HPI.   Physical Exam: Blood pressure 114/79, pulse (!) 118, temperature 99.7 F (37.6 C), temperature source Oral, resp. rate 20, SpO2 94 %.  Physical Exam Constitutional:      General: He is in acute distress.     Comments: Tired appearance.  He is weak and it is difficult for him to sit up for lung auscultation.  HENT:     Head: Normocephalic.  Eyes:     General:        Right eye: No discharge.        Left eye: No discharge.  Cardiovascular:     Rate and Rhythm: Regular rhythm. Tachycardia present.  Pulmonary:     Comments: Diminished breath sound bilaterally  Abdominal:      General: Bowel sounds are normal.     Palpations: Abdomen is soft.     Tenderness: There is no abdominal tenderness.  Musculoskeletal:        General: No tenderness.     Right lower leg: No edema.     Left lower leg: No edema.     Comments: Bilateral feet cool to touch.  Normal pulses palpated of bilat feet  Skin:    Coloration: Skin is not jaundiced.  Neurological:     Mental Status: He is alert.  Psychiatric:        Mood and Affect: Mood normal.     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  Also work-up for PE given his degree of hypoxia, tachycardia and significantly elevated D-dimer.   Acute respiratory failure with hypoxia secondary to COVID-19 infection Patient is unvaccinated.  Developed symptoms 3-4 days ago.  Tested positive for Covid on 02/05/2020.  -Start remdesivir and Decadron -Daily inflammatory markers -Patient was originally sating well on 4 L, then dropped to mid 80s with a-fib. Place pt on heated high flow with improvement of Os sat.    Elevated D-dimer, suspected PE D-dimer on admission was 12.1. He denies chest pain, history of blood clots, long travel or long immobility, recent surgery or history of cancer.  PERC score of 3 which cannot rule out PE.  Wells score of 1.5 which is low risk for PE.  Given his degree of hypoxia and tachycardia, will obtain VQ  scan to rule out PE.  Could not obtain CTA given his renal function. -VTE prophylaxis with subQ heparin -Pending VQ scan, if positive will treat with IV heparin   New onset of Atrial fibrillation  Patient was found to be in a-fib with HR 130s. This is a new onset of a-fib, likely secondary to active infection. Could not rule out PE. BP was soft with systolic 70-90. Lowest MAP of 55. Given 1L bolus of LR with improvement of BP. Given his ongoing a-fib and soft BP, start Amiodarone drip. -Continue Amio drip -Monitor vital sign closely   AKI Baseline creatinine around 1.1 (back in 2013).  Creatinine on  admission of 2.8, likely due to poor p.o. intake and diarrhea.  Patient received 1 L of LR and 500 cc of normal saline in the ED. -LR 100 cc/h  -Obtain urine sodium and urine creatinine to calculate FeNa -Encourage p.o. intake if able -CMP in a.m.   Thrombocytopenia Platelets of 86 on admission, which could be related to COVID-19 infection.  However cannot rule out DIC.  Obtain DIC panel, blood smear, haptoglobin, PT/INR.    Hypertension Holding lisinopril-hydrochlorothiazide given soft blood pressure and AKI   Rheumatoid arthritis Restart half dose of sulfasalazine 500 mg twice daily   Code: Full VTE prophylaxis: Subq heparin  Dispo: Admit patient to Inpatient with expected length of stay greater than 2 midnights.  Signed: Gaylan Gerold, DO 02/19/2020, 8:55 PM  Pager: [phone] After 5pm on weekdays and 1pm on weekends: On Call pager: [phone]

## 2020-02-19 NOTE — ED Notes (Signed)
Urinal placed at bedside.

## 2020-02-19 NOTE — ED Provider Notes (Signed)
[facility] DEPART[facility] Provider Note   CSN: [id number] Arrival date & time: 02/19/20  1457     History Chief Complaint  Patient presents with  . Shortness of Breath  . Diarrhea    TARIQUE SUDERMAN is a 70 y.o. male with past medical history of hypertension and chronic pain syndrome who presents to the ED with progressively worsening COVID-19 symptoms.  Patient reports that he was exposed to COVID-19 around Christmas and tested positive for COVID-19 at a [facility] on 12/14/2019.  Since then, he has been having progressively worsening shortness of breath symptoms in addition to frequent diarrhea.  He states that he feels thirsty and reports that he has not taken his blood pressure medications in the past 3 days.  He feels cool to the touch.  Patient reports that his days are blurring together.  Patient was not immunized for COVID-19.  Remote history of tobacco use.  HPI     Past Medical History:  Diagnosis Date  . Basal cell carcinoma 01/24/2014   left temple tx mohs Dr Harvel Quale 03/12/2014  . Chronic pain   . Hypertension     There are no problems to display for this patient.   Past Surgical History:  Procedure Laterality Date  . BACK SURGERY     x 3  . REPLACEMENT TOTAL KNEE BILATERAL    . TOTAL HIP ARTHROPLASTY     bilateral       No family history on file.  Social History   Tobacco Use  . Smoking status: Former Smoker  Substance Use Topics  . Alcohol use: No  . Drug use: No    Home Medications Prior to Admission medications   Medication Sig Start Date End Date Taking? Authorizing Provider  atorvastatin (LIPITOR) 40 MG tablet Take 40 mg by mouth daily. 12/04/19   [provider]  brimonidine (ALPHAGAN) 0.2 % ophthalmic solution SMARTSIG:In Eye(s) 02/16/20   [provider]  famotidine (PEPCID) 20 MG tablet Take 20 mg by mouth 2 (two) times daily. 01/15/20   [provider]   HYDROcodone-acetaminophen (NORCO) 10-325 MG tablet Take by mouth. 02/12/20   [provider]  latanoprost (XALATAN) 0.005 % ophthalmic solution SMARTSIG:In Eye(s) 12/01/19   [provider]  lisinopril-hydrochlorothiazide (ZESTORETIC) 20-12.5 MG tablet Take 1 tablet by mouth daily. 12/04/19   [provider]  ondansetron (ZOFRAN) 4 MG tablet Take 1 tablet (4 mg total) by mouth every 6 (six) hours. 11/24/11   Prentiss Bells, MD  pregabalin (LYRICA) 150 MG capsule  01/15/20   [provider]  sulfaSALAzine (AZULFIDINE) 500 MG tablet Take 1,000 mg by mouth 2 (two) times daily. 12/12/19   [provider]  timolol (TIMOPTIC) 0.5 % ophthalmic solution SMARTSIG:In Eye(s) 02/12/20   [provider]    Allergies    Penicillins  Review of Systems   Review of Systems  All other systems reviewed and are negative.   Physical Exam Updated Vital Signs BP 105/72   Pulse (!) 106   Temp 99.7 F (37.6 C) (Oral)   Resp 18   SpO2 94%   Physical Exam Vitals and nursing note reviewed. Exam conducted with a chaperone present.  Constitutional:      Appearance: He is ill-appearing.  HENT:     Head: Normocephalic and atraumatic.  Eyes:     General: No scleral icterus.    Conjunctiva/sclera: Conjunctivae normal.  Cardiovascular:     Rate and Rhythm: Regular rhythm.  Tachycardia present.  Pulmonary:     Effort: Pulmonary effort is normal.  Skin:    General: Skin is dry.     Comments: Diarrhea/fecal matter noted along the inside of his right leg.  Neurological:     Mental Status: He is alert.     GCS: GCS eye subscore is 4. GCS verbal subscore is 5. GCS motor subscore is 6.  Psychiatric:        Mood and Affect: Mood normal.        Behavior: Behavior normal.        Thought Content: Thought content normal.     ED Results / Procedures / Treatments   Labs (all labs ordered are listed, but only abnormal results are displayed) Labs Reviewed   LIPASE, BLOOD - Abnormal; Notable for the following components:      Result Value   Lipase 79 (*)    All other components within normal limits  COMPREHENSIVE METABOLIC PANEL - Abnormal; Notable for the following components:   Chloride 97 (*)    Glucose, Bld 143 (*)    BUN 113 (*)    Creatinine, Ser 2.80 (*)    Alkaline Phosphatase 35 (*)    GFR, Estimated 24 (*)    Anion gap 16 (*)    All other components within normal limits  CBC - Abnormal; Notable for the following components:   MCV 100.6 (*)    Platelets 86 (*)    nRBC 1.3 (*)    All other components within normal limits  D-DIMER, QUANTITATIVE (NOT AT [facility]) - Abnormal; Notable for the following components:   D-Dimer, Quant 12.11 (*)    All other components within normal limits  LACTATE DEHYDROGENASE - Abnormal; Notable for the following components:   LDH 366 (*)    All other components within normal limits  FERRITIN - Abnormal; Notable for the following components:   Ferritin 452 (*)    All other components within normal limits  TRIGLYCERIDES - Abnormal; Notable for the following components:   Triglycerides 244 (*)    All other components within normal limits  C-REACTIVE PROTEIN - Abnormal; Notable for the following components:   CRP 2.3 (*)    All other components within normal limits  CULTURE, BLOOD (ROUTINE X 2)  CULTURE, BLOOD (ROUTINE X 2)  LACTIC ACID, PLASMA  PROCALCITONIN  FIBRINOGEN  URINALYSIS, ROUTINE W REFLEX MICROSCOPIC  LACTIC ACID, PLASMA  HIV ANTIBODY (ROUTINE TESTING W REFLEX)    EKG None  Radiology DG Chest Port 1 View  Result Date: 02/19/2020 CLINICAL DATA:  70 year old male with hypoxia and shortness of breath. EXAM: PORTABLE CHEST 1 VIEW COMPARISON:  Chest radiograph dated 11/24/2011 FINDINGS: Bilateral patchy and streaky densities most consistent with multifocal pneumonia, possibly viral or atypical in etiology. Clinical correlation recommended. No lobar consolidation, pleural effusion or  pneumothorax. Stable cardiac silhouette. No acute osseous pathology. Degenerative changes of the shoulders. IMPRESSION: Multifocal pneumonia. Clinical correlation and follow-up recommended. Electronically Signed   By: Elgie Collard M.D.   On: 02/19/2020 16:59    Procedures .Critical Care Performed by: Lorelee New, PA-C Authorized by: Lorelee New, PA-C   Critical care provider statement:    Critical care time (minutes):  45   Critical care was necessary to treat or prevent imminent or life-threatening deterioration of the following conditions:  Respiratory failure   Critical care was time spent personally by me on the following activities:  Discussions with consultants, evaluation of patient's response to treatment,  examination of patient, ordering and performing treatments and interventions, ordering and review of laboratory studies, ordering and review of radiographic studies, pulse oximetry, re-evaluation of patient's condition, obtaining history from patient or surrogate, review of old charts and blood draw for specimens Comments:     Hypoxic respiratory failure COVID-19.  New oxygen requirement.     (including critical care time)  Medications Ordered in ED Medications  remdesivir 200 mg in sodium chloride 0.9% 250 mL IVPB (has no administration in time range)    Followed by  remdesivir 100 mg in sodium chloride 0.9 % 100 mL IVPB (has no administration in time range)  sodium chloride 0.9 % bolus 500 mL (500 mLs Intravenous New Bag/Given 02/19/20 1810)  dexamethasone (DECADRON) injection 8 mg (8 mg Intravenous Given 02/19/20 1815)    ED Course  I have reviewed the triage vital signs and the nursing notes.  Pertinent labs & imaging results that were available during my care of the patient were reviewed by me and considered in my medical decision making (see chart for details).  Clinical Course as of 02/19/20 Tamala Julian Feb 19, 2020  1918 I spoke with Dr. Vicenta Aly who will see  and admit patient for his acute respiratory failure in setting of COVID-19.   [GG]    Clinical Course User Index [GG] Corena Herter, PA-C   MDM Rules/Calculators/A&P                          Patient with hypoxia in the setting of COVID-19 multifocal pneumonia.  We will start patient on remdesivir and Decadron.  Will admit to the hospitalist for ongoing evaluation and management.  He is very weak and fatigued in the room.  Hypoxic in the 60s on RA, even at rest.  Basic laboratory work-up is pending.  He is tachycardic to 115 during my examination, likely due to his diminished p.o. intake.  He states that he has not been eating and drinking well and also endorses multiple loose stools per day.  He does not appear to be caring for himself well and even has fecal matter on his legs.  No previous oxygen requirements.  He was not vaccinated for COVID-19.  Labs CMP: Elevated creatinine to 2.80 and reduced GFR to 24, significant worsening when compared to most recent labs obtained 8 years ago.  Patient also has elevated BUN to 113. D-dimer: Significant elevation to 12.11.  Patient also has elevated COVID-19 inflammatory markers including LDH. Lactic acid: Upper limit normal of 1.9.  DG chest is personally reviewed and demonstrates bilateral patchy and streaky densities concerning for multifocal pneumonia.  Positive COVID-19 test can be viewed in his chart [facility]  Will consult unassigned medicine for admission. While he does have an elevated D-Dimer, GFR reduced to 24. Not a candidate for contrast scan.  He denies any hx of bleeds.  No currently anticoagulated.  Will hold on consulting pharmacy for heparin until I discuss with hospitalist.    I spoke with Dr. Vicenta Aly who will see and admit patient for his acute respiratory failure in setting of COVID-19.    ROGERIO ANGLEY was evaluated in Emergency Department on 02/19/2020 for the symptoms described in the history of  present illness. He was evaluated in the context of the global COVID-19 pandemic, which necessitated consideration that the patient might be at risk for infection with the SARS-CoV-2 virus that causes COVID-19. Institutional protocols and algorithms  that pertain to the evaluation of patients at risk for COVID-19 are in a state of rapid change based on information released by regulatory bodies including the CDC and federal and state organizations. These policies and algorithms were followed during the patient's care in the ED.  Final Clinical Impression(s) / ED Diagnoses Final diagnoses:  COVID-19  Hypoxia    Rx / DC Orders ED Discharge Orders    None       Corena Herter, PA-C 02/19/20 1920    Pattricia Boss, MD 02/19/20 [phone]

## 2020-02-20 ENCOUNTER — Inpatient Hospital Stay (HOSPITAL_COMMUNITY): Payer: Medicare Other

## 2020-02-20 DIAGNOSIS — R338 Other retention of urine: Secondary | ICD-10-CM | POA: Diagnosis present

## 2020-02-20 DIAGNOSIS — I4891 Unspecified atrial fibrillation: Secondary | ICD-10-CM | POA: Diagnosis present

## 2020-02-20 DIAGNOSIS — J9601 Acute respiratory failure with hypoxia: Secondary | ICD-10-CM | POA: Diagnosis present

## 2020-02-20 DIAGNOSIS — R3129 Other microscopic hematuria: Secondary | ICD-10-CM | POA: Diagnosis present

## 2020-02-20 DIAGNOSIS — R195 Other fecal abnormalities: Secondary | ICD-10-CM | POA: Diagnosis present

## 2020-02-20 LAB — COMPREHENSIVE METABOLIC PANEL
ALT: 18 U/L (ref 0–44)
AST: 34 U/L (ref 15–41)
Albumin: 2.9 g/dL — ABNORMAL LOW (ref 3.5–5.0)
Alkaline Phosphatase: 27 U/L — ABNORMAL LOW (ref 38–126)
Anion gap: 13 (ref 5–15)
BUN: 108 mg/dL — ABNORMAL HIGH (ref 8–23)
CO2: 22 mmol/L (ref 22–32)
Calcium: 7.9 mg/dL — ABNORMAL LOW (ref 8.9–10.3)
Chloride: 103 mmol/L (ref 98–111)
Creatinine, Ser: 2.12 mg/dL — ABNORMAL HIGH (ref 0.61–1.24)
GFR, Estimated: 33 mL/min — ABNORMAL LOW (ref >60)
Glucose, Bld: 166 mg/dL — ABNORMAL HIGH (ref 70–99)
Potassium: 4.6 mmol/L (ref 3.5–5.1)
Sodium: 138 mmol/L (ref 135–145)
Total Bilirubin: 0.7 mg/dL (ref 0.3–1.2)
Total Protein: 5.7 g/dL — ABNORMAL LOW (ref 6.5–8.1)

## 2020-02-20 LAB — URINALYSIS, ROUTINE W REFLEX MICROSCOPIC
Bacteria, UA: NONE SEEN
Bilirubin Urine: NEGATIVE
Glucose, UA: NEGATIVE mg/dL
Ketones, ur: NEGATIVE mg/dL
Leukocytes,Ua: NEGATIVE
Nitrite: NEGATIVE
Protein, ur: 30 mg/dL — AB
Specific Gravity, Urine: 1.02 (ref 1.005–1.030)
pH: 5 (ref 5.0–8.0)

## 2020-02-20 LAB — CBC WITH DIFFERENTIAL/PLATELET
Abs Immature Granulocytes: 0.06 10*3/uL (ref 0.00–0.07)
Basophils Absolute: 0 10*3/uL (ref 0.0–0.1)
Basophils Relative: 0 %
Eosinophils Absolute: 0 10*3/uL (ref 0.0–0.5)
Eosinophils Relative: 0 %
HCT: 46.7 % (ref 39.0–52.0)
Hemoglobin: 15.4 g/dL (ref 13.0–17.0)
Immature Granulocytes: 1 %
Lymphocytes Relative: 15 %
Lymphs Abs: 1.4 10*3/uL (ref 0.7–4.0)
MCH: 33.8 pg (ref 26.0–34.0)
MCHC: 33 g/dL (ref 30.0–36.0)
MCV: 102.6 fL — ABNORMAL HIGH (ref 80.0–100.0)
Monocytes Absolute: 0.9 10*3/uL (ref 0.1–1.0)
Monocytes Relative: 10 %
Neutro Abs: 6.8 10*3/uL (ref 1.7–7.7)
Neutrophils Relative %: 74 %
Platelets: 87 10*3/uL — ABNORMAL LOW (ref 150–400)
RBC: 4.55 MIL/uL (ref 4.22–5.81)
RDW: 13.2 % (ref 11.5–15.5)
WBC: 9.2 10*3/uL (ref 4.0–10.5)
nRBC: 1.1 % — ABNORMAL HIGH (ref 0.0–0.2)

## 2020-02-20 LAB — C-REACTIVE PROTEIN: CRP: 1.8 mg/dL — ABNORMAL HIGH (ref <1.0)

## 2020-02-20 LAB — CULTURE, BLOOD (ROUTINE X 2)

## 2020-02-20 LAB — D-DIMER, QUANTITATIVE: D-Dimer, Quant: 14.05 ug/mL-FEU — ABNORMAL HIGH (ref 0.00–0.50)

## 2020-02-20 LAB — TSH: TSH: 0.434 u[IU]/mL (ref 0.350–4.500)

## 2020-02-20 LAB — POC OCCULT BLOOD, ED: Fecal Occult Bld: POSITIVE — AB

## 2020-02-20 MED ORDER — LACTATED RINGERS IV BOLUS
500.0000 mL | Freq: Once | INTRAVENOUS | Status: AC
  Administered 2020-02-20: 500 mL via INTRAVENOUS

## 2020-02-20 MED ORDER — HEPARIN (PORCINE) 25000 UT/250ML-% IV SOLN
1600.0000 [IU]/h | INTRAVENOUS | Status: DC
  Administered 2020-02-20: 1600 [IU]/h via INTRAVENOUS
  Filled 2020-02-20: qty 250

## 2020-02-20 MED ORDER — HEPARIN BOLUS VIA INFUSION
2500.0000 [IU] | Freq: Once | INTRAVENOUS | Status: AC
  Administered 2020-02-20: 2500 [IU] via INTRAVENOUS
  Filled 2020-02-20: qty 2500

## 2020-02-20 MED ORDER — AMIODARONE HCL IN DEXTROSE 360-4.14 MG/200ML-% IV SOLN
60.0000 mg/h | INTRAVENOUS | Status: DC
  Administered 2020-02-20: 60 mg/h via INTRAVENOUS
  Filled 2020-02-20: qty 200

## 2020-02-20 MED ORDER — TECHNETIUM TO 99M ALBUMIN AGGREGATED
4.3000 | Freq: Once | INTRAVENOUS | Status: AC | PRN
  Administered 2020-02-20: 4.3 via INTRAVENOUS

## 2020-02-20 MED ORDER — AMIODARONE LOAD VIA INFUSION
150.0000 mg | Freq: Once | INTRAVENOUS | Status: AC
  Administered 2020-02-20: 150 mg via INTRAVENOUS
  Filled 2020-02-20: qty 83.34

## 2020-02-20 MED ORDER — AMIODARONE HCL IN DEXTROSE 360-4.14 MG/200ML-% IV SOLN
30.0000 mg/h | INTRAVENOUS | Status: DC
  Administered 2020-02-20: 30 mg/h via INTRAVENOUS
  Filled 2020-02-20: qty 200

## 2020-02-20 MED ORDER — LACTATED RINGERS IV SOLN: INTRAVENOUS | Status: AC

## 2020-02-20 NOTE — ED Notes (Signed)
Pt to Nuclear Med for VQ Scan

## 2020-02-20 NOTE — ED Notes (Signed)
Noticed afib on monitor - EKG done and provider Cyndie Chime made aware. Awaiting for further orders.

## 2020-02-20 NOTE — ED Notes (Addendum)
Pt called [employer] operational officer on phone for help. Pt found to have had loose BM on self. While attempting to get pt cleaned up, pt had multiple loose watery BM's. Was not able to keep pt clean due to continuous BM's. RN made aware.

## 2020-02-20 NOTE — ED Notes (Signed)
Lunch Tray Ordered @ 1034. 

## 2020-02-20 NOTE — ED Notes (Addendum)
At change of shift, pt aao4, gsc15, eating dinner tray, spo2 89% on 6 liters [place], Other vss on ccm, pt watching tv. LR and heparin infusing without difficulty, NADN. Side rails up, call bell in place.

## 2020-02-20 NOTE — Significant Event (Addendum)
Mr. Douglas [name] was noted to be tachycardic with range 110s-160s/170s with lower than expected blood pressures with range 70s-90s/40s-60s.  We went to evaluate patient at bedside and he was mentating well with no signs of encephalopathy however he appeared to be in distress. He was given an LR bolus without improvement to his vitals. His oxygen saturation was noted to have dropped to about 80% and at this point he was switched to high flow nasal cannula at 8 L.  In addition, he was noted to be in atrial fibrillation with RVR on multiple EKG readings.  Atrial fibrillation is a result of his ongoing systemic infection however given the fact that he had a soft blood pressure with labile heart rate up to the 160s, an order for amiodarone load and infusion was written with spontaneous conversion to sinus rhythm with HR in the 90s and improved blood pressure.  His CHA2DS2-VASc score is 2 which makes him a candidate for anticoagulation however given his new thrombocytopenia less than 100,000 and also the fact that he would most likely not require long-term anticoagulation once he recovers from his COVID infection, will hold off on initiating heparin infusion for atrial fibrillation and continue to monitor.  Will continue to access him through the night and treat accordingly as events evolve.

## 2020-02-20 NOTE — ED Notes (Signed)
Family updated at this time.

## 2020-02-20 NOTE — ED Notes (Signed)
Performed straight catheterization per MD Hoffman direction out

## 2020-02-20 NOTE — ED Notes (Signed)
Dinner Tray Ordered @ 1713. 

## 2020-02-20 NOTE — ED Notes (Signed)
Dr. Cyndie Chime made aware of pts afib EKG and current blood pressure of 98/59.awaiting for further orders.

## 2020-02-20 NOTE — Progress Notes (Signed)
HD#1 Subjective:   Patient admitted yesterday evening.  Patient seen at bedside this AM. He states that he is "feeling more well rested" this morning. He states that his stomach pain and diarrhea are improved. He still endorses coughing and SHOB. Denies nausea, vomiting, chest pain or history of blood clots. We discussed his overnight events, he has not been told he has had a. Fib in the past, but recalls that his granddaughter may have had it. Noted that patient's condom cath urine bag without urine, patient states he has not made urine during his hospital stay, though states he has not tried. He is unable to void while the team is in the room.   Objective:   Vital signs in last 24 hours: Vitals:   02/20/20 1030 02/20/20 1045 02/20/20 1130 02/20/20 1215  BP: 101/78 105/75 91/66 91/71   Pulse: 84 84  78  Resp: 19 18 14 15   Temp:      TempSrc:      SpO2: 99% 99%  100%   Supplemental O2: HFNC SpO2: 100 % O2 Flow Rate (L/min): 8 L/min  Physical Exam Constitutional: tired- and ill-appearing man laying in ED stretcher Cardiovascular: regular rate and rhythm, no m/r/g, no lower extremity edema, mildly cool extremities Pulmonary/Chest: dyspneic with speaking on 8 L HFNC, tachypneic, mildly decreased bibasilar breath sounds, no wheezing  Abdominal: distended, slightly tense, BS+, suprapubic tenderness Neurological/Psych: oriented to person, place, time, and situation, though speech is slightly slowed with frequent pauses, patient repeats himself  Pertinent Labs: CBC Latest Ref Rng & Units 02/20/2020 02/19/2020 02/19/2020  WBC 4.0 - 10.5 K/uL 9.2 - 9.1  Hemoglobin 13.0 - 17.0 g/dL 15.4 - 16.1  Hematocrit 39.0 - 52.0 % 46.7 - 47.7  Platelets 150 - 400 K/uL 87(L) 87(L) 86(L)    CMP Latest Ref Rng & Units 02/20/2020 02/19/2020 11/24/2011  Glucose 70 - 99 mg/dL 166(H) 143(H) 99  BUN 8 - 23 mg/dL 108(H) 113(H) 20  Creatinine 0.61 - 1.24 mg/dL 2.12(H) 2.80(H) 1.16  Sodium 135 - 145 mmol/L 138  137 134(L)  Potassium 3.5 - 5.1 mmol/L 4.6 4.3 4.2  Chloride 98 - 111 mmol/L 103 97(L) 100  CO2 22 - 32 mmol/L 22 24 25   Calcium 8.9 - 10.3 mg/dL 7.9(L) 8.9 9.2  Total Protein 6.5 - 8.1 g/dL 5.7(L) 7.0 7.5  Total Bilirubin 0.3 - 1.2 mg/dL 0.7 0.5 0.1(L)  Alkaline Phos 38 - 126 U/L 27(L) 35(L) 74  AST 15 - 41 U/L 34 33 16  ALT 0 - 44 U/L 18 17 15     Imaging: DG Chest Port 1 View  Result Date: 02/19/2020 CLINICAL DATA:  70 year old male with hypoxia and shortness of breath. EXAM: PORTABLE CHEST 1 VIEW COMPARISON:  Chest radiograph dated 11/24/2011 FINDINGS: Bilateral patchy and streaky densities most consistent with multifocal pneumonia, possibly viral or atypical in etiology. Clinical correlation recommended. No lobar consolidation, pleural effusion or pneumothorax. Stable cardiac silhouette. No acute osseous pathology. Degenerative changes of the shoulders. IMPRESSION: Multifocal pneumonia. Clinical correlation and follow-up recommended. Electronically Signed   By: Anner Crete M.D.   On: 02/19/2020 16:59    Assessment/Plan:   Principal Problem:   Pneumonia due to COVID-19 virus Active Problems:   AKI (acute kidney injury) ([place])   Thrombocytopenia (H[facility])   Rheumatoid arthritis ([place])   Acute urinary retention   Acute hypoxemic respiratory failure (HCC)   Atrial fibrillation with rapid ventricular response (HCC)   Heme positive stool   Microscopic hematuria  Patient Summary: Douglas [name] is a 70 y.o. man with with a pertinent PMH of rheumatoid arthritis, hypertension, osteoarthritis, who presented with shortness of breath after recent diagnosis of COVID-19 via PCR on 02/13/20 and was admitted for further evaluation and treatment of acute respiratory failure with hypoxia.  This is hospital day 1.   Acute respiratory failure with hypoxia due to COVID-19 Unvaccinated. Positive PCR test on 02/13/20, today is day 7 since diagnosis. On admission, reported symptom onset  ~02/16/20. On admission, originally satting well on 4L, however Currently requiring 8 L heated HFNC to maintain saturations >90%. Inflammatory markers elevated on admission. - on day 2/5 of remdesivir (Day 1: 02/19/20) - on day 2/10 of dexamethasone (Day 1: 02/19/20) - currently on 8 L HFNC; wean as able - IS, flutter valve  - antitussives - self prone when able  - Trend inflammatory markers daily  Elevated D-dimer D-dimer on admission 12, increased to 14 this morning. Tachycardia has improved after IV fluids. No lower extremity swelling or edema on exam. Suspect his hypoxemia and tachycardia related more to COVID-19 and dehydration, however VQ scan pending to rule out PE.  - VQ scan pending - VTE ppx with subQ heparin  New-onset atrial fibrillation Patient back in normal sinus rhythm, rate-controlled, after initiation of amiodarone drip and fluids. Will discontinue amiodarone and continue to monitor closely. - Discontinue amiodarone drip  AKI Acute urinary retention Microscopic hematuria Unknown recent baseline sCr, last on record 1.1 in 2013. Creatinine improved to 2.12 this morning from 2.80 on admission after fluids. With decreased PO intake and diarrhea in the setting of COVID-19 infection, also improvement with IVF, suspicious for prerenal etiology. However, during evaluation this morning patient with suprapubic tenderness and noted to have urinary retention with post-void residual of 600 cc on bladder scan with subsequent I&O cath with 700 cc UOP. Patient is not aware of history of enlarged prostate. Urinalysis obtained via I&O cath unremarkable except for moderate microscopic hematuria which may be from trauma of catheterization. Will continue to monitor with timed bladder scans and I&O as needed and possible foley insertion if necessary. Will recommend repeat urinalysis as an outpatient in 4 weeks. - Will reach out to PCP to ascertain patient's more recent baseline creatinine - AM BMP -  Continue mIVF - Monitor for urinary retention with q6h bladder scans  - Patient will need repeat urinalysis in 4 weeks  Thrombocytopenia Stably low at 87k this morning. No signs or symptoms of bleeding. In addition to COVID-19 infection, patient is taking Lyrica which can cause thrombocytopenia. DIC panel negative. - Hold Lyrica - Will contact PCP to determine baseline platelet count  Hypertension BP soft on admission, improved after fluids though SBP still 90s-100s. - Continue to hold lisinopril-HCTZ in setting of soft pressures and AKI  Rheumatoid arthritis - Will hold sulfasalazine for now given treatment with decadron  Hemoccult positive Possible melena Patient noted to have some dark stool this morning, hemoccult positive. Concerning for occult GI bleeding. Hgb stable at 15. On chart review, patient appears to have history of positive fit test. - Contact PCP for additional information regarding prior surveillance/testing - Monitor closely for signs/symptoms of bleeding - Monitor CBC - Subq heparin for DVT ppx for now  Acute metabolic encephalopathy This morning, patient with some mild cognitive dysfunction manifested as slower speech with frequent pauses. Likely related to his acute illness/dehydration.  - Holding Lyrica as above   Diet: Heart Healthy IVF: LR,100cc/hr VTE: Heparin Code: Full PT/OT  recs: TBD TOC recs: TBD   Please contact the on call pager after 5 pm and on weekends at [phone].  Alexandria Lodge, MD PGY-1 Internal Medicine Teaching Service Pager: [phone] 02/20/2020

## 2020-02-20 NOTE — ED Notes (Signed)
Hemoccult +ve, Bladder scan revealed post void residual. Hoffman DO notified

## 2020-02-20 NOTE — ED Notes (Signed)
RT at bedside - placed pt on high flow O2.

## 2020-02-20 NOTE — ED Notes (Signed)
SDU Breakfast Ordered 

## 2020-02-20 NOTE — Progress Notes (Signed)
ANTICOAGULATION CONSULT NOTE - Initial Consult  Pharmacy Consult for IV heparin Indication: pulmonary embolus  Allergies  Allergen Reactions  . Cefazolin Rash    See 08/2012 admission   . Morphine Sulfate   . Penicillins Hives  . Hydrochlorothiazide Rash  . Other Rash    Tide detergent  . Oxycodone Rash  . Vancomycin Rash    Drug rash     Patient Measurements: Estimated TBW ~102 kg  Estimated IBW ~ 78 kg Heparin Dosing Weight: 98.9 kg  Vital Signs: Temp: 97.8 F (36.6 C) (01/04 1615) Temp Source: Oral (01/04 0615) BP: 120/74 (01/04 1615) Pulse Rate: 53 (01/04 1615)  Labs: Recent Labs    02/19/20 1543 02/19/20 2019 02/20/20 0324  HGB 16.1  --  15.4  HCT 47.7  --  46.7  PLT 86* 87* 87*  APTT  --  30  --   LABPROT  --  13.7  --   INR  --  1.1  --   CREATININE 2.80*  --  2.12*    CrCl cannot be calculated (Unknown ideal weight.).   Medical History: Past Medical History:  Diagnosis Date  . Basal cell carcinoma 01/24/2014   left temple tx mohs Dr Alean Rinne 03/12/2014  . Chronic pain   . Hypertension     Medications:  Scheduled:  . brimonidine  1 drop Both Eyes BID  . dexamethasone  6 mg Oral Q24H  . famotidine  20 mg Oral BID  . heparin  5,000 Units Subcutaneous Q8H  . latanoprost  1 drop Both Eyes QHS  . timolol  1 drop Both Eyes Daily    Assessment: 70 year old male admitted 02/19/20 for worsening COVID-19 infection with elevated D-dimer, now with PE per perfusion lung scan. No anticoagulation prior to admission. Currently requiring 6L .  Pt was receiving subcutaneous heparin, last dose today 1537. Platelets low at 87 but stable, Hgb wnl and stable. Per chart review, dark stool reported this morning, hemoccult positive and concern for occult GI bleeding.   Given low platelets, recent subcut heparin dose, and O2 needs, will give reduced IV heparin bolus.  Goal of Therapy:  Heparin level 0.3-0.5 units/ml Monitor platelets by anticoagulation  protocol: Yes   Plan:  Give 2500 units bolus x 1 Start heparin infusion at 1600 units/hr Heparin level in 6 hours Daily heparin level and CBC Monitor s/sx bleeding  Laverna Peace, PharmD PGY-1 Pharmacy Resident 02/20/2020 5:20 PM Please see AMION for all pharmacy numbers

## 2020-02-20 NOTE — ED Notes (Signed)
Dr. Cyndie Chime at bedside. Pt HR still 133 with BP 92/62.

## 2020-02-20 NOTE — ED Notes (Signed)
RT made aware of providers verbal orders to place pt on high flow.

## 2020-02-21 DIAGNOSIS — R195 Other fecal abnormalities: Secondary | ICD-10-CM

## 2020-02-21 DIAGNOSIS — U071 COVID-19: Principal | ICD-10-CM

## 2020-02-21 DIAGNOSIS — I2699 Other pulmonary embolism without acute cor pulmonale: Secondary | ICD-10-CM

## 2020-02-21 DIAGNOSIS — J1282 Pneumonia due to coronavirus disease 2019: Secondary | ICD-10-CM

## 2020-02-21 LAB — CBC WITH DIFFERENTIAL/PLATELET
Abs Immature Granulocytes: 0.04 10*3/uL (ref 0.00–0.07)
Basophils Absolute: 0 10*3/uL (ref 0.0–0.1)
Basophils Relative: 0 %
Eosinophils Absolute: 0 10*3/uL (ref 0.0–0.5)
Eosinophils Relative: 0 %
HCT: 43.2 % (ref 39.0–52.0)
Hemoglobin: 14.1 g/dL (ref 13.0–17.0)
Immature Granulocytes: 1 %
Lymphocytes Relative: 17 %
Lymphs Abs: 1.3 10*3/uL (ref 0.7–4.0)
MCH: 33.2 pg (ref 26.0–34.0)
MCHC: 32.6 g/dL (ref 30.0–36.0)
MCV: 101.6 fL — ABNORMAL HIGH (ref 80.0–100.0)
Monocytes Absolute: 0.5 10*3/uL (ref 0.1–1.0)
Monocytes Relative: 7 %
Neutro Abs: 5.8 10*3/uL (ref 1.7–7.7)
Neutrophils Relative %: 75 %
Platelets: 70 10*3/uL — ABNORMAL LOW (ref 150–400)
RBC: 4.25 MIL/uL (ref 4.22–5.81)
RDW: 13.7 % (ref 11.5–15.5)
WBC: 7.7 10*3/uL (ref 4.0–10.5)
nRBC: 0.5 % — ABNORMAL HIGH (ref 0.0–0.2)

## 2020-02-21 LAB — COMPREHENSIVE METABOLIC PANEL
ALT: 16 U/L (ref 0–44)
AST: 28 U/L (ref 15–41)
Albumin: 2.8 g/dL — ABNORMAL LOW (ref 3.5–5.0)
Alkaline Phosphatase: 31 U/L — ABNORMAL LOW (ref 38–126)
Anion gap: 12 (ref 5–15)
BUN: 96 mg/dL — ABNORMAL HIGH (ref 8–23)
CO2: 23 mmol/L (ref 22–32)
Calcium: 8.1 mg/dL — ABNORMAL LOW (ref 8.9–10.3)
Chloride: 103 mmol/L (ref 98–111)
Creatinine, Ser: 1.64 mg/dL — ABNORMAL HIGH (ref 0.61–1.24)
GFR, Estimated: 45 mL/min — ABNORMAL LOW (ref >60)
Glucose, Bld: 123 mg/dL — ABNORMAL HIGH (ref 70–99)
Potassium: 4.5 mmol/L (ref 3.5–5.1)
Sodium: 138 mmol/L (ref 135–145)
Total Bilirubin: 1 mg/dL (ref 0.3–1.2)
Total Protein: 5.4 g/dL — ABNORMAL LOW (ref 6.5–8.1)

## 2020-02-21 LAB — HEPARIN LEVEL (UNFRACTIONATED)
Heparin Unfractionated: 0.61 IU/mL (ref 0.30–0.70)
Heparin Unfractionated: 0.95 IU/mL — ABNORMAL HIGH (ref 0.30–0.70)
Heparin Unfractionated: 0.98 IU/mL — ABNORMAL HIGH (ref 0.30–0.70)
Heparin Unfractionated: 1.09 IU/mL — ABNORMAL HIGH (ref 0.30–0.70)

## 2020-02-21 LAB — D-DIMER, QUANTITATIVE: D-Dimer, Quant: 6.83 ug/mL-FEU — ABNORMAL HIGH (ref 0.00–0.50)

## 2020-02-21 LAB — C-REACTIVE PROTEIN: CRP: 1.8 mg/dL — ABNORMAL HIGH (ref <1.0)

## 2020-02-21 MED ORDER — FLUTICASONE PROPIONATE 50 MCG/ACT NA SUSP
2.0000 | Freq: Every day | NASAL | Status: DC
  Administered 2020-02-21 – 2020-02-28 (×8): 2 via NASAL
  Filled 2020-02-21: qty 16

## 2020-02-21 MED ORDER — PANTOPRAZOLE SODIUM 40 MG PO TBEC: 40.0000 mg | DELAYED_RELEASE_TABLET | Freq: Two times a day (BID) | ORAL | Status: DC

## 2020-02-21 MED ORDER — HEPARIN (PORCINE) 25000 UT/250ML-% IV SOLN
1100.0000 [IU]/h | INTRAVENOUS | Status: AC
  Administered 2020-02-21: 14:00:00 1100 [IU]/h via INTRAVENOUS
  Administered 2020-02-24 – 2020-02-26 (×3): 900 [IU]/h via INTRAVENOUS
  Administered 2020-02-27: 1100 [IU]/h via INTRAVENOUS
  Filled 2020-02-21 (×6): qty 250

## 2020-02-21 MED ORDER — PANTOPRAZOLE SODIUM 40 MG IV SOLR
40.0000 mg | Freq: Two times a day (BID) | INTRAVENOUS | Status: DC
  Administered 2020-02-21 – 2020-02-23 (×5): 40 mg via INTRAVENOUS
  Filled 2020-02-21 (×5): qty 40

## 2020-02-21 MED ORDER — HEPARIN (PORCINE) 25000 UT/250ML-% IV SOLN
1300.0000 [IU]/h | INTRAVENOUS | Status: DC
  Administered 2020-02-21 (×2): 1300 [IU]/h via INTRAVENOUS
  Filled 2020-02-21: qty 250

## 2020-02-21 NOTE — Progress Notes (Signed)
ANTICOAGULATION CONSULT NOTE   Pharmacy Consult for IV heparin Indication: pulmonary embolus  Allergies  Allergen Reactions  . Cefazolin Rash    See 08/2012 admission   . Morphine Sulfate   . Penicillins Hives  . Hydrochlorothiazide Rash  . Other Rash    Tide detergent  . Oxycodone Rash  . Vancomycin Rash    Drug rash     Patient Measurements: Estimated TBW ~102 kg  Estimated IBW ~ 78 kg Heparin Dosing Weight: 98.9 kg  Vital Signs: Temp: 98.4 F (36.9 C) (01/04 2031) Temp Source: Oral (01/04 2031) BP: 126/81 (01/04 2330) Pulse Rate: 100 (01/05 0015)  Labs: Recent Labs    02/19/20 1543 02/19/20 2019 02/20/20 0324 02/20/20 2330  HGB 16.1  --  15.4  --   HCT 47.7  --  46.7  --   PLT 86* 87* 87*  --   APTT  --  30  --   --   LABPROT  --  13.7  --   --   INR  --  1.1  --   --   HEPARINUNFRC  --   --   --  1.09*  CREATININE 2.80*  --  2.12*  --     CrCl cannot be calculated (Unknown ideal weight.).   Medical History: Past Medical History:  Diagnosis Date  . Basal cell carcinoma 01/24/2014   left temple tx mohs Dr Alean Rinne 03/12/2014  . Chronic pain   . Hypertension     Medications:  Scheduled:  . brimonidine  1 drop Both Eyes BID  . dexamethasone  6 mg Oral Q24H  . famotidine  20 mg Oral BID  . latanoprost  1 drop Both Eyes QHS  . timolol  1 drop Both Eyes Daily    Assessment: 70 year old male admitted 02/19/20 for worsening COVID-19 infection with elevated D-dimer, now with PE per perfusion lung scan. No anticoagulation prior to admission. Currently requiring 6L [place].  Pt was receiving subcutaneous heparin, last dose today 1537. Platelets low at 87 but stable, Hgb wnl and stable. Per chart review, dark stool reported this morning, hemoccult positive and concern for occult GI bleeding.   Given low platelets, recent subcut heparin dose, and O2 needs, will give reduced IV heparin bolus.  1/5 AM update:  Heparin level elevated No issues per RN  Goal  of Therapy:  Heparin level 0.3-0.5 units/ml Monitor platelets by anticoagulation protocol: Yes   Plan:  Hold heparin x 1 hr Re-start heparin drip at 1300 units/hr at 0200 1000 HL Daily heparin level and CBC Monitor s/sx bleeding  Abran Duke, PharmD, BCPS Clinical Pharmacist Phone: [phone]

## 2020-02-21 NOTE — ED Notes (Signed)
 via bladder scan

## 2020-02-21 NOTE — Progress Notes (Signed)
ANTICOAGULATION CONSULT NOTE   Pharmacy Consult for IV heparin Indication: pulmonary embolus  Allergies  Allergen Reactions  . Cefazolin Rash    See 08/2012 admission   . Morphine Sulfate   . Penicillins Hives  . Hydrochlorothiazide Rash  . Other Rash    Tide detergent  . Oxycodone Rash  . Vancomycin Rash    Drug rash     Patient Measurements: Estimated TBW ~102 kg  Estimated IBW ~ 78 kg Heparin Dosing Weight: 98.9 kg  Vital Signs: Temp: 98.3 F (36.8 C) (01/05 0826) Temp Source: Oral (01/05 0826) BP: 118/81 (01/05 0826) Pulse Rate: 82 (01/05 0826)  Labs: Recent Labs    02/19/20 1543 02/19/20 2019 02/20/20 0324 02/20/20 2330 02/21/20 0728 02/21/20 1040  HGB 16.1  --  15.4  --  14.1  --   HCT 47.7  --  46.7  --  43.2  --   PLT 86* 87* 87*  --  70*  --   APTT  --  30  --   --   --   --   LABPROT  --  13.7  --   --   --   --   INR  --  1.1  --   --   --   --   HEPARINUNFRC  --   --   --  1.09* 0.95* 0.98*  CREATININE 2.80*  --  2.12*  --  1.64*  --     Estimated Creatinine Clearance: 53.6 mL/min (A) (by C-G formula based on SCr of 1.64 mg/dL (H)).   Medical History: Past Medical History:  Diagnosis Date  . Basal cell carcinoma 01/24/2014   left temple tx mohs Dr Alean Rinne 03/12/2014  . Chronic pain   . Hypertension     Medications:  Scheduled:  . brimonidine  1 drop Both Eyes BID  . dexamethasone  6 mg Oral Q24H  . famotidine  20 mg Oral BID  . latanoprost  1 drop Both Eyes QHS  . [START ON 02/24/2020] pantoprazole  40 mg Oral BID  . pantoprazole (PROTONIX) IV  40 mg Intravenous Q12H  . timolol  1 drop Both Eyes Daily    Assessment: 70 year old male admitted 02/19/20 for worsening COVID-19 infection with elevated D-dimer, now with PE per perfusion lung scan. No anticoagulation prior to admission. Currently requiring 6L [place].  Pt was receiving subcutaneous heparin, last dose today 1537. Platelets low at 87 but stable, Hgb wnl and stable. Per chart  review, dark stool reported this morning, hemoccult positive and concern for occult GI bleeding.   Given low platelets, recent subcut heparin dose, and O2 needs, will give reduced IV heparin bolus.  Heparin level still high, will hold for 1 hour and restart at a lower rate.   Goal of Therapy:  Heparin level 0.3-0.5 units/ml Monitor platelets by anticoagulation protocol: Yes   Plan:  Hold heparin x 1 hr Re-start heparin drip at 1100 units/hr at 1330 Heparin level in 8 hours Daily heparin level and CBC Monitor s/sx bleeding  Abran Duke, PharmD, BCPS Clinical Pharmacist Phone: [phone]

## 2020-02-21 NOTE — Progress Notes (Signed)
HD#2 Subjective:   Yesterday afternoon, V/Q scan demonstrated changes consistent with pulmonary embolism. Patient started on heparin per pharmacy.  No acute events overnight.  Patient seen at bedside this AM. He states he is feeling better compared to yesterday in that his breathing is less labored. Reports he has been voiding without issue using the bedside urinal since yesterday evening. States he is not aware of having had low platelet count in the past. Does recall having a positive Fit test around 2019 but discussed option of "just watching it" with his PCP. Has not had a colonoscopy. Does not recall ever having had dark stools.  Objective:   Vital signs in last 24 hours: Vitals:   02/20/20 2330 02/21/20 0015 02/21/20 0200 02/21/20 0300  BP: 126/81  117/73 136/84  Pulse: 88 100 63 89  Resp: 18 (!) 27 20 16   Temp:    98.1 F (36.7 C)  TempSrc:    Oral  SpO2: (!) 85% 92% (!) 88% 92%  Weight:    106.7 kg  Height:    6' (1.829 m)   Supplemental O2: HFNC SpO2: 92 % O2 Flow Rate (L/min): 6 L/min  Physical Exam Constitutional: tired- and ill-appearing man sitting upright in bed, in no acute distress Cardiovascular: regular rate and rhythm, no m/r/g, no lower extremity edema, mildly cool extremities Pulmonary/Chest: mild dyspnea with speaking on 6 L [place], mildly decreased bibasilar breath sounds, no wheezing  Abdominal: mildy distended, soft, BS+, no suprapubic tenderness Neurological/Psych: oriented to person, place, time, and situation, speech and thought process faster this morning GI: Flexiseal in place with melanotic liquid stool  Pertinent Labs: CBC Latest Ref Rng & Units 02/20/2020 02/19/2020 02/19/2020  WBC 4.0 - 10.5 K/uL 9.2 - 9.1  Hemoglobin 13.0 - 17.0 g/dL 04/18/2020 - 22.0  Hematocrit 39.0 - 52.0 % 46.7 - 47.7  Platelets 150 - 400 K/uL 87(L) 87(L) 86(L)    CMP Latest Ref Rng & Units 02/20/2020 02/19/2020 11/24/2011  Glucose 70 - 99 mg/dL 01/24/2012) 270(W) 99  BUN 8 - 23 mg/dL  237(S) 283(T) 20  Creatinine 0.61 - 1.24 mg/dL 517(O) 1.60(V) 3.71(G  Sodium 135 - 145 mmol/L 138 137 134(L)  Potassium 3.5 - 5.1 mmol/L 4.6 4.3 4.2  Chloride 98 - 111 mmol/L 103 97(L) 100  CO2 22 - 32 mmol/L 22 24 25   Calcium 8.9 - 10.3 mg/dL 7.9(L) 8.9 9.2  Total Protein 6.5 - 8.1 g/dL 6.26) 7.0 7.5  Total Bilirubin 0.3 - 1.2 mg/dL 0.7 0.5 )  Alkaline Phos 38 - 126 U/L 27(L) 35(L) 74  AST 15 - 41 U/L 34 33 16  ALT 0 - 44 U/L 18 17 15     Imaging: NM Pulmonary Perfusion  Result Date: 02/20/2020 CLINICAL DATA:  Shortness of breath and history of COVID-19 positivity EXAM: NUCLEAR MEDICINE PERFUSION LUNG SCAN TECHNIQUE: Perfusion images were obtained in multiple projections after intravenous injection of radiopharmaceutical. Ventilation scans intentionally deferred if perfusion scan and chest x-ray adequate for interpretation during COVID 19 epidemic. RADIOPHARMACEUTICALS:  4.3 mCi Tc-86m MAA IV COMPARISON:  Chest x-ray from the previous day. FINDINGS: Uptake is noted bilaterally although multiple wedge-shaped defects are identified throughout both lungs consistent with pulmonary emboli. These appear greater than the associated parenchymal opacity seen on recent chest x-ray particularly in the right upper lobe. IMPRESSION: Changes consistent with pulmonary emboli. Electronically Signed   By: M.D.   On: 02/20/2020 16:01    Assessment/Plan:   Principal Problem:  Pneumonia due to COVID-19 virus Active Problems:   AKI (acute kidney injury) ([place])   Thrombocytopenia (HCC)   Rheumatoid arthritis ([place])   Acute urinary retention   Acute hypoxemic respiratory failure (HCC)   Atrial fibrillation with rapid ventricular response (HCC)   Heme positive stool   Microscopic hematuria   Patient Summary: Douglas [name] is a 70 y.o. man with with a pertinent PMH of rheumatoid arthritis, hypertension, osteoarthritis, who presented with shortness of breath after recent diagnosis  of COVID-19 via PCR on 02/13/20 and was admitted for further evaluation and treatment of acute respiratory failure with hypoxia.  This is hospital day 2.   Acute respiratory failure with hypoxia due to COVID-19 Unvaccinated. Positive PCR test on 02/13/20, today is day 8 since diagnosis. Down to 6 L [place] from 8 L heated HFNC to maintain saturations >90%. Inflammatory markers elevated on admission, CRP stable at 1.8, D-dimer down to 6.83 from 14 on admission. - on day 3/5 of remdesivir (Day 1: 02/19/20) - on day 3/10 of dexamethasone (Day 1: 02/19/20) - currently on 6 L [place]; wean as able - IS, flutter valve  - antitussives - self prone when able  - Trend inflammatory markers daily  Pulmonary embolism V/Q scan yesterday consistent with pulmonary embolism. Patient initiated on heparin per pharmacy. Oxygen requirement decreased compared to yesterday. Continues to deny chest pain.  - Heparin per pharmacy  Hemoccult positive Possible melena Patient noted to have melanotic stool in his Flexiseal this morning. Hemoglobin 16.1->15.4->14.1 this admission. Concerning for occult GI bleeding. Patient reports he opted to not have follow-up colonoscopy with previous positive fit test in 2019. Given his indication for anticoagulation, will consult GI to evaluate for conservative management vs EGD/colonoscopy.  - GI consulted, appreciate recs - Contact PCP for additional information regarding prior surveillance/testing - Monitor closely for signs/symptoms of bleeding - Monitor CBC  AKI Acute urinary retention Microscopic hematuria Creatinine continues to improve, down to 1.64 this morning from from 2.80 on admission with fluids. Patient also able to void spontaneously after acute retention yesterday. No suprapubic tenderness this morning. Given improvement with IVF, suspicious for prerenal etiology. Patient tolerating PO so will encourage PO hydration.  - Will reach out to PCP to ascertain patient's more  recent baseline creatinine - AM BMP - Encourage PO fluids - Monitor for urinary retention with q6h bladder scans  - Patient will need repeat urinalysis in 4 weeks given microscopic hematuria  Thrombocytopenia 70k this morning, down from 87k yesterday. Melanotic stools concerning for GI bleeding as above. In addition to COVID-19 infection, patient is taking Lyrica which can cause thrombocytopenia. DIC panel negative. - Hold Lyrica - Will contact PCP to determine baseline platelet count  New-onset atrial fibrillation, resolved Patient back in normal sinus rhythm after initiation of amiodarone drip and fluids the first day of admission. Amio gtt discontinued yesterday and patient has maintained NSR.  - Cardiac telemetry  Acute metabolic encephalopathy, resolved Yesterday patient with some mild cognitive dysfunction manifested as slower speech with frequent pauses. Much improved this morning. - Holding Lyrica as above  Hypertension BP soft on admission, improved after fluids, SBP 110s-130s - Continue to hold lisinopril-HCTZ in setting of normotension and AKI  Rheumatoid arthritis - Will hold sulfasalazine for now given treatment with decadron   Diet: Heart Healthy IVF: none VTE: Heparin Code: Full PT/OT recs: TBD TOC recs: TBD   Please contact the on call pager after 5 pm and on weekends at [phone].  Alexandria Lodge,  MD PGY-1 Internal Medicine Teaching Service Pager: [phone] 02/21/2020

## 2020-02-21 NOTE — Consult Note (Signed)
[facility] Consult: 11:08 AM 02/21/2020  LOS: 2 days    Referring Provider: Dr Heber [place]  Primary Care Physician:  Tobe Sos, MD Primary Gastroenterologist:  Althia Forts.      Reason for Consultation:  Melena.  FIT test + 11/2018.     HPI: Douglas [name] is a 70 y.o. male.  From [place]  PMH Hld.  Htn.  Rheumatoid arthritis.  Chronic pain.  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  S/p bil TKR.  Multiple spinal surgeries.  Decreased visual acuity, legally blind left eye.  Status post right cataract surgery.  PE, DVT 2013 Coumadin at time of hip prosthesis/infection and redo THRs.   Anemia w Hgb 9.4 in 2009 at time of R THR.  Transfused autologous RBCs in 10/2015, circumstances leading to this unclear. Last Hgb 12.1 in 08/2012 in [facility].   2014 colonoscopy, pt recalls having had polyps but no recall as to suggested follow-up or polyp pathology.  Positive FIT test 11/2018.  Refused colonoscopy "too much pain" per charting but pt does not recall suggestion for colonoscopy after d/w PCP.  The FIT test appeared in the mail and Pt completed it.  His PCP (who apparently did not order the FIT test) advised him that he would "watch things".  Dx COVID-19 positive on 12/28.  Never immunized..  Presented to ED 1/3 with shortness of breath, sore throat, diarrhea.  Initial blood pressures 90s/60s currently 1 teens/80s.  Oxygen sats in low 90s on 6 L [place] oxygen.  Issues since admission include A. fib, hypertension, melenic stools which predate the diagnosis of PE confirmed by CT on 1/4.  IV heparin initiated 1 AM today.  Tolerating solid HH diet.  Meds include the heparin, Decadron, remdesivir, amiodarone.  Outpt Pepcid 20 mg bid continues.    Hgb has dropped 2  g to 14.1.  He is macrocytic.  Platelets are low, in the 70s.  AKI but unknown baseline renal function.  Diarrhea developed about 2 days PTA.  Patient did not pay much attention and thinks the stools may have been dark but they were not bloody.  Stools were dark and heme positive on 02/20/2020.  Previously no issues with changes in bowel habits and normally has brown stools daily.  Patient denies any recent or current nausea, vomiting, abdominal pain, heartburn, dysphagia, weight loss, NSAID use.  No history of alcohol use/abuse.  No unusual or excessive bleeding or bruising.  Does not recall MD ever telling him he had problems with his liver or platelets but does recall previous anemia.     . Past Medical History:  Diagnosis Date  . Basal cell carcinoma 01/24/2014   left temple tx mohs Dr Harvel Quale 03/12/2014  . Chronic pain   . Hypertension     Past Surgical History:  Procedure Laterality Date  . BACK SURGERY     x 3  . REPLACEMENT TOTAL KNEE BILATERAL    . TOTAL HIP ARTHROPLASTY     bilateral    Prior to Admission medications  Medication Sig Start Date End Date Taking? Authorizing Provider  atorvastatin (LIPITOR) 40 MG tablet Take 40 mg by mouth daily. 12/04/19  Yes [provider]  brimonidine (ALPHAGAN) 0.2 % ophthalmic solution Place 1 drop into both eyes in the morning and at bedtime. 02/16/20  Yes [provider]  famotidine (PEPCID) 20 MG tablet Take 20 mg by mouth 2 (two) times daily. 01/15/20  Yes [provider]  HYDROcodone-acetaminophen (NORCO) 10-325 MG tablet Take 1 tablet by mouth every 4 (four) hours as needed (pain). 02/12/20  Yes [provider]  latanoprost (XALATAN) 0.005 % ophthalmic solution Place 1 drop into both eyes at bedtime. 12/01/19  Yes [provider]  lisinopril-hydrochlorothiazide (ZESTORETIC) 20-12.5 MG tablet Take 1 tablet by mouth daily. 12/04/19  Yes [provider]  pregabalin (LYRICA) 150 MG  capsule Take 150 mg by mouth 2 (two) times daily. 01/15/20  Yes [provider]  sulfaSALAzine (AZULFIDINE) 500 MG tablet Take 1,000 mg by mouth 2 (two) times daily. 12/12/19  Yes [provider]  timolol (TIMOPTIC) 0.5 % ophthalmic solution Place 1 drop into both eyes daily. 02/12/20  Yes [provider]  ondansetron (ZOFRAN) 4 MG tablet Take 1 tablet (4 mg total) by mouth every 6 (six) hours. Patient not taking: No sig reported 11/24/11   Prentiss Bells, MD    Scheduled Meds: . brimonidine  1 drop Both Eyes BID  . dexamethasone  6 mg Oral Q24H  . famotidine  20 mg Oral BID  . latanoprost  1 drop Both Eyes QHS  . timolol  1 drop Both Eyes Daily   Infusions: . heparin 1,300 Units/hr (02/21/20 0850)  . remdesivir 100 mg in NS 100 mL 100 mg (02/21/20 0839)   PRN Meds: guaiFENesin-dextromethorphan, HYDROcodone-acetaminophen, polyethylene glycol   Allergies as of 02/19/2020 - Review Complete 02/19/2020  Allergen Reaction Noted  . Cefazolin Rash 08/24/2012  . Morphine sulfate  02/19/2020  . Penicillins Hives 11/24/2011  . Hydrochlorothiazide Rash 08/11/2013  . Other Rash 08/11/2013  . Oxycodone Rash 02/19/2020  . Vancomycin Rash 08/22/2012    No family history on file.  Social History   Socioeconomic History  . Marital status: Married    Spouse name: Not on file  . Number of children: Not on file  . Years of education: Not on file  . Highest education level: Not on file  Occupational History  . Not on file  Tobacco Use  . Smoking status: Former Research scientist (life sciences)  . Smokeless tobacco: Not on file  Substance and Sexual Activity  . Alcohol use: No  . Drug use: No  . Sexual activity: Not on file  Other Topics Concern  . Not on file  Social History Narrative  . Not on file   Social Determinants of Health   Financial Resource Strain: Not on file  Food Insecurity: Not on file  Transportation Needs: Not on file  Physical Activity: Not on file  Stress:  Not on file  Social Connections: Not on file  Intimate Partner Violence: Not on file    REVIEW OF SYSTEMS: Constitutional: Malaise improving ENT:  No nose bleeds Pulm: SOP, cough improved not resolved. CV:  No palpitations, no LE edema.  No angina. GU:  No hematuria, no frequency GI: See HPI Heme: See HPI Transfusions: HPI Neuro:  No headaches, no peripheral tingling or numbness Derm:  No itching, no rash or sores.  Endocrine:  No sweats or chills.  No polyuria or dysuria Immunization: Patient has  had previous Tdap, Pneumovax, flu shot.  When asked why he did not have the Covid vaccination he said his wife, who seems to make all the decisions, felt that it was unnecessary. Travel:  None beyond local counties in last few months.    PHYSICAL EXAM: Vital signs in last 24 hours: Vitals:   02/21/20 0300 02/21/20 0826  BP: 136/84 118/81  Pulse: 89 82  Resp: 16 19  Temp: 98.1 F (36.7 C) 98.3 F (36.8 C)  SpO2: 92% 91%   Wt Readings from Last 3 Encounters:  02/21/20 106.7 kg  11/24/11 97.5 kg    General: Obese, does not look acutely ill.  Alert comfortable in bed Head: No facial asymmetry or swelling.  No signs of head trauma. Eyes: No conjunctival pallor or scleral icterus.  EOMI Ears: Slightly HOH Nose: No discharge or congestion Mouth: Mucosa pink, moist, clear.  Tongue midline. Neck: No JVD, no masses, no thyromegaly. Lungs: Clear bilaterally Heart: RRR. Abdomen:  Soft, NT, ND.  No mass, hernia, orgnomegaly, bruits.   Rectal: deferred.     Musc/Skeltl: Arthritic changes in the fingers.  Postop changes in the knees. Extremities: No CCE. Neurologic: Oriented x3.  Speech slow but clear.  Moves all 4 limbs.  Strength not tested.  No tremors. Skin: Significant bruising, rashes or suspicious lesions   Psych: Calm, cooperative, pleasant.  Intake/Output from previous day: 01/04 0701 - 01/05 0700 In: 1458.4 [I.V.:1398.4; IV Piggyback:60] Out: 1600  [Urine:1600] Intake/Output this shift: No intake/output data recorded.  LAB RESULTS: Recent Labs    02/19/20 1543 02/19/20 2019 02/20/20 0324 02/21/20 0728  WBC 9.1  --  9.2 7.7  HGB 16.1  --  15.4 14.1  HCT 47.7  --  46.7 43.2  PLT 86* 87* 87* 70*   BMET Lab Results  Component Value Date   NA 138 02/21/2020   NA 138 02/20/2020   NA 137 02/19/2020   K 4.5 02/21/2020   K 4.6 02/20/2020   K 4.3 02/19/2020   CL 103 02/21/2020   CL 103 02/20/2020   CL 97 (L) 02/19/2020   CO2 23 02/21/2020   CO2 22 02/20/2020   CO2 24 02/19/2020   GLUCOSE 123 (H) 02/21/2020   GLUCOSE 166 (H) 02/20/2020   GLUCOSE 143 (H) 02/19/2020   BUN 96 (H) 02/21/2020   BUN 108 (H) 02/20/2020   BUN 113 (H) 02/19/2020   CREATININE 1.64 (H) 02/21/2020   CREATININE 2.12 (H) 02/20/2020   CREATININE 2.80 (H) 02/19/2020   CALCIUM 8.1 (L) 02/21/2020   CALCIUM 7.9 (L) 02/20/2020   CALCIUM 8.9 02/19/2020   LFT Recent Labs    02/19/20 1543 02/20/20 0324 02/21/20 0728  PROT 7.0 5.7* 5.4*  ALBUMIN 3.5 2.9* 2.8*  AST 33 34 28  ALT 17 18 16   ALKPHOS 35* 27* 31*  BILITOT 0.5 0.7 1.0   PT/INR Lab Results  Component Value Date   INR 1.1 02/19/2020   INR 2.5 (H) 09/25/2007   INR 2.9 (H) 09/24/2007   Hepatitis Panel No results for input(s): HEPBSAG, HCVAB, HEPAIGM, HEPBIGM in the last 72 hours. C-Diff No components found for: CDIFF Lipase     Component Value Date/Time   LIPASE 79 (H) 02/19/2020 1543    Drugs of Abuse  No results found for: LABOPIA, COCAINSCRNUR, LABBENZ, AMPHETMU, THCU, LABBARB   RADIOLOGY STUDIES: NM Pulmonary Perfusion  Result Date: 02/20/2020 CLINICAL DATA:  Shortness of breath and history of COVID-19 positivity EXAM: NUCLEAR MEDICINE PERFUSION LUNG SCAN  TECHNIQUE: Perfusion images were obtained in multiple projections after intravenous injection of radiopharmaceutical. Ventilation scans intentionally deferred if perfusion scan and chest x-ray adequate for interpretation  during COVID 19 epidemic. RADIOPHARMACEUTICALS:  4.3 mCi Tc-48m MAA IV COMPARISON:  Chest x-ray from the previous day. FINDINGS: Uptake is noted bilaterally although multiple wedge-shaped defects are identified throughout both lungs consistent with pulmonary emboli. These appear greater than the associated parenchymal opacity seen on recent chest x-ray particularly in the right upper lobe. IMPRESSION: Changes consistent with pulmonary emboli. Electronically Signed   By: Inez Catalina M.D.   On: 02/20/2020 16:01   DG Chest Port 1 View  Result Date: 02/19/2020 CLINICAL DATA:  70 year old male with hypoxia and shortness of breath. EXAM: PORTABLE CHEST 1 VIEW COMPARISON:  Chest radiograph dated 11/24/2011 FINDINGS: Bilateral patchy and streaky densities most consistent with multifocal pneumonia, possibly viral or atypical in etiology. Clinical correlation recommended. No lobar consolidation, pleural effusion or pneumothorax. Stable cardiac silhouette. No acute osseous pathology. Degenerative changes of the shoulders. IMPRESSION: Multifocal pneumonia. Clinical correlation and follow-up recommended. Electronically Signed   By: Anner Crete M.D.   On: 02/19/2020 16:59     IMPRESSION:   *    Reported melenic stool.  FOBT +.    *    Positive fit test 11/2018.Marland Kitchen  Refused MD advised to pursue colonoscopy  *    COVID-19 pneumonia  *    Acute pulmonary embolus.  Had DVT and PE in 2012 and previously treated with Coumadin. IV heparin initiated 11 hours ago.  *     Thrombocytopenia, 86 >> 70.  INR, LFTs normal.  *    Macrocytosis without anemia.  Hb has gone from 16.1 >> 14.1 in the last 3 days.  Previous diagnosis of postoperative anemia and previous transfusions.  *    AKI.  Improved.  Baseline renal function not known. Note urinary retention subjectively and per bladder scan this AM    PLAN:     *   Start Protonix 40 mg IV bid x 3 d then 40 mg po bid if stable.    *   No plans for EGD at  present.  Did discuss possibility that this might be pursued at some point and pt is okay with pursuing EGD if necessary.    *   Leave current HH diet in place.    *   Daily AM CBC.     Azucena Freed  02/21/2020, 11:08 AM Phone [phone]

## 2020-02-21 NOTE — Progress Notes (Signed)
ANTICOAGULATION CONSULT NOTE   Pharmacy Consult for IV heparin Indication: pulmonary embolus  Allergies  Allergen Reactions  . Cefazolin Rash    See 08/2012 admission   . Morphine Sulfate   . Penicillins Hives  . Hydrochlorothiazide Rash  . Other Rash    Tide detergent  . Oxycodone Rash  . Vancomycin Rash    Drug rash     Patient Measurements: Estimated TBW ~102 kg  Estimated IBW ~ 78 kg Heparin Dosing Weight: 98.9 kg  Vital Signs: Temp: 97.6 F (36.4 C) (01/05 2003) Temp Source: Oral (01/05 2003) BP: 128/77 (01/05 2003) Pulse Rate: 71 (01/05 2003)  Labs: Recent Labs    02/19/20 1543 02/19/20 2019 02/20/20 0324 02/20/20 2330 02/21/20 0728 02/21/20 1040 02/21/20 2106  HGB 16.1  --  15.4  --  14.1  --   --   HCT 47.7  --  46.7  --  43.2  --   --   PLT 86* 87* 87*  --  70*  --   --   APTT  --  30  --   --   --   --   --   LABPROT  --  13.7  --   --   --   --   --   INR  --  1.1  --   --   --   --   --   HEPARINUNFRC  --   --   --    < > 0.95* 0.98* 0.61  CREATININE 2.80*  --  2.12*  --  1.64*  --   --    < > = values in this interval not displayed.    Estimated Creatinine Clearance: 53.6 mL/min (A) (by C-G formula based on SCr of 1.64 mg/dL (H)).  Assessment: 70 year old male admitted 02/19/20 for worsening COVID-19 infection with elevated D-dimer, now with PE per perfusion lung scan. No anticoagulation prior to admission. Currently requiring 6L [place].  Pt was receiving subcutaneous heparin, last dose today 1537. Platelets low at 87 but stable, Hgb wnl and stable. Per chart review, dark stool reported this morning, hemoccult positive and concern for occult GI bleeding.   Given low platelets, recent subcut heparin dose, and O2 needs, will give reduced IV heparin bolus.  Heparin level still high, will hold for 1 hour and restart at a lower rate.   Heparin level is above goal at 0.61 on 1100 units/hr. No bleeding noted since last note.    Goal of Therapy:   Heparin level 0.3-0.5 units/ml Monitor platelets by anticoagulation protocol: Yes   Plan:  Decrease heparin drip to 950 units/hr  Daily heparin level and CBC Monitor s/sx bleeding   Thank you for involving pharmacy in this patient's care.  Loura Back, PharmD, BCPS Clinical Pharmacist Clinical phone for 02/21/2020 until 10p is [phone] 02/21/2020 9:46 PM  **Pharmacist phone directory can be found on [email or web address] listed under [facility]**

## 2020-02-22 ENCOUNTER — Encounter (HOSPITAL_COMMUNITY): Payer: Self-pay | Admitting: Internal Medicine

## 2020-02-22 LAB — COMPREHENSIVE METABOLIC PANEL
ALT: 16 U/L (ref 0–44)
AST: 21 U/L (ref 15–41)
Albumin: 2.9 g/dL — ABNORMAL LOW (ref 3.5–5.0)
Alkaline Phosphatase: 31 U/L — ABNORMAL LOW (ref 38–126)
Anion gap: 12 (ref 5–15)
BUN: 83 mg/dL — ABNORMAL HIGH (ref 8–23)
CO2: 24 mmol/L (ref 22–32)
Calcium: 8.3 mg/dL — ABNORMAL LOW (ref 8.9–10.3)
Chloride: 103 mmol/L (ref 98–111)
Creatinine, Ser: 1.6 mg/dL — ABNORMAL HIGH (ref 0.61–1.24)
GFR, Estimated: 46 mL/min — ABNORMAL LOW (ref >60)
Glucose, Bld: 130 mg/dL — ABNORMAL HIGH (ref 70–99)
Potassium: 4.7 mmol/L (ref 3.5–5.1)
Sodium: 139 mmol/L (ref 135–145)
Total Bilirubin: 0.9 mg/dL (ref 0.3–1.2)
Total Protein: 5.7 g/dL — ABNORMAL LOW (ref 6.5–8.1)

## 2020-02-22 LAB — HEPARIN LEVEL (UNFRACTIONATED)
Heparin Unfractionated: 0.57 IU/mL (ref 0.30–0.70)
Heparin Unfractionated: 0.46 IU/mL (ref 0.30–0.70)

## 2020-02-22 LAB — CBC WITH DIFFERENTIAL/PLATELET
Abs Immature Granulocytes: 0.02 10*3/uL (ref 0.00–0.07)
Basophils Absolute: 0 10*3/uL (ref 0.0–0.1)
Basophils Relative: 0 %
Eosinophils Absolute: 0 10*3/uL (ref 0.0–0.5)
Eosinophils Relative: 0 %
HCT: 40.8 % (ref 39.0–52.0)
Hemoglobin: 13.8 g/dL (ref 13.0–17.0)
Immature Granulocytes: 0 %
Lymphocytes Relative: 15 %
Lymphs Abs: 0.9 10*3/uL (ref 0.7–4.0)
MCH: 34.1 pg — ABNORMAL HIGH (ref 26.0–34.0)
MCHC: 33.8 g/dL (ref 30.0–36.0)
MCV: 100.7 fL — ABNORMAL HIGH (ref 80.0–100.0)
Monocytes Absolute: 0.5 10*3/uL (ref 0.1–1.0)
Monocytes Relative: 8 %
Neutro Abs: 4.5 10*3/uL (ref 1.7–7.7)
Neutrophils Relative %: 77 %
Platelets: 71 10*3/uL — ABNORMAL LOW (ref 150–400)
RBC: 4.05 MIL/uL — ABNORMAL LOW (ref 4.22–5.81)
RDW: 13.6 % (ref 11.5–15.5)
WBC: 5.8 10*3/uL (ref 4.0–10.5)
nRBC: 0.7 % — ABNORMAL HIGH (ref 0.0–0.2)

## 2020-02-22 LAB — C-REACTIVE PROTEIN: CRP: 1.3 mg/dL — ABNORMAL HIGH (ref <1.0)

## 2020-02-22 LAB — CULTURE, BLOOD (ROUTINE X 2): Culture: NO GROWTH

## 2020-02-22 LAB — D-DIMER, QUANTITATIVE: D-Dimer, Quant: 5.01 ug/mL-FEU — ABNORMAL HIGH (ref 0.00–0.50)

## 2020-02-22 NOTE — Progress Notes (Signed)
ANTICOAGULATION CONSULT NOTE - Follow Up Consult  Pharmacy Consult for heparin Indication: PE in setting of possible GIB  Labs: Recent Labs    02/19/20 2019 02/20/20 0324 02/20/20 2330 02/21/20 0728 02/21/20 1040 02/21/20 2106 02/22/20 0058  HGB  --  15.4  --  14.1  --   --  13.8  HCT  --  46.7  --  43.2  --   --  40.8  PLT 87* 87*  --  70*  --   --  71*  APTT 30  --   --   --   --   --   --   LABPROT 13.7  --   --   --   --   --   --   INR 1.1  --   --   --   --   --   --   HEPARINUNFRC  --   --    < > 0.95* 0.98* 0.61 0.57  CREATININE  --  2.12*  --  1.64*  --   --  1.60*   < > = values in this interval not displayed.    Assessment: 70yo male remains supratherapeutic on heparin with little change in level after rate change.  Goal of Therapy:  Heparin level 0.3-0.5 units/ml   Plan:  Will decrease heparin gtt by 1-2 units/kg/hr to 800 units/hr and check level in 6 hours.    Vernard Gambles, PharmD, BCPS  02/22/2020,3:01 AM

## 2020-02-22 NOTE — Progress Notes (Signed)
HD#3 Subjective:   No acute events overnight.  Prior to evaluating patient this AM, discussed his progress with PT and his RN outside the room. PT notes patient seems "foggy" with impaired cognition and poor attention. His RN reiterated this and notes the patient has been unable to void spontaneously this morning, is about to bladder scan him.  During evaluation at bedside, patient appears tired, sitting in bedside chair eating breakfast. When asked if he feels better, he states, "there is a lot going on in the room." He acknowledges difficulty with his thought process stating, "the harder I try to think the harder it is to think." States he has been unable to urinate this morning and thinks the discomfort is contributing to his difficulty thinking. He denies shortness of breath or chest pain. Maintaining saturations >90% on 5 L HFNC.    Objective:   Vital signs in last 24 hours: Vitals:   02/21/20 1410 02/21/20 2003 02/22/20 0000 02/22/20 0411  BP:  128/77 (!) 155/89 (!) 141/89  Pulse:  71 71 73  Resp: 16 17 14 16   Temp:  97.6 F (36.4 C) 97.8 F (36.6 C) 97.9 F (36.6 C)  TempSrc:  Oral Oral Oral  SpO2:  92% 90% 91%  Weight:      Height:       Supplemental O2: HFNC SpO2: 91 % O2 Flow Rate (L/min): 5 L/min  Physical Exam Constitutional: tired- and ill-appearing man sitting in bedside chair, in no acute distress Cardiovascular: regular rate and rhythm, no m/r/g, no lower extremity edema Pulmonary/Chest: mild dyspnea with speaking on 5 L HFNC Abdominal: mildy distended, soft, BS+, suprapubic tenderness Neurological/Psych: oriented to person, place, time, and situation, speech and thought process slower this morning, patient unable to subtract 7 from 100 or say the months of the year backwards GI: Flexiseal in place  Pertinent Labs: CBC Latest Ref Rng & Units 02/22/2020 02/21/2020 02/20/2020  WBC 4.0 - 10.5 K/uL 5.8 7.7 9.2  Hemoglobin 13.0 - 17.0 g/dL 13.8 14.1 15.4   Hematocrit 39.0 - 52.0 % 40.8 43.2 46.7  Platelets 150 - 400 K/uL 71(L) 70(L) 87(L)    CMP Latest Ref Rng & Units 02/22/2020 02/21/2020 02/20/2020  Glucose 70 - 99 mg/dL 130(H) 123(H) 166(H)  BUN 8 - 23 mg/dL 83(H) 96(H) 108(H)  Creatinine 0.61 - 1.24 mg/dL 1.60(H) 1.64(H) 2.12(H)  Sodium 135 - 145 mmol/L 139 138 138  Potassium 3.5 - 5.1 mmol/L 4.7 4.5 4.6  Chloride 98 - 111 mmol/L 103 103 103  CO2 22 - 32 mmol/L 24 23 22   Calcium 8.9 - 10.3 mg/dL 8.3(L) 8.1(L) 7.9(L)  Total Protein 6.5 - 8.1 g/dL 5.7(L) 5.4(L) 5.7(L)  Total Bilirubin 0.3 - 1.2 mg/dL 0.9 1.0 0.7  Alkaline Phos 38 - 126 U/L 31(L) 31(L) 27(L)  AST 15 - 41 U/L 21 28 34  ALT 0 - 44 U/L 16 16 18     Assessment/Plan:   Principal Problem:   Pneumonia due to COVID-19 virus Active Problems:   AKI (acute kidney injury) (HCC)   Thrombocytopenia (HCC)   Rheumatoid arthritis (HCC)   Acute urinary retention   Acute hypoxemic respiratory failure (HCC)   Atrial fibrillation with rapid ventricular response (HCC)   Heme positive stool   Microscopic hematuria   Patient Summary: Douglas [name] is a 70 y.o. man with with a pertinent PMH of rheumatoid arthritis, hypertension, osteoarthritis, who presented with shortness of breath after recent diagnosis of COVID-19 via PCR on 02/13/20  and was admitted for further evaluation and treatment of acute respiratory failure with hypoxia.  This is hospital day 3.   Acute respiratory failure with hypoxia due to COVID-19 Unvaccinated. Positive PCR test on 02/13/20, today is day 9 since diagnosis. On 5 L HFNC to maintain saturations >90%. Inflammatory markers downtrending. - on day 4/5 of remdesivir (Day 1: 02/19/20) - on day 4/10 of dexamethasone (Day 1: 02/19/20) - currently on 5 L HFNC; wean as able - IS, flutter valve  - antitussives - self prone when able  - Trend inflammatory markers daily  Pulmonary embolism Patient initiated on heparin per pharmacy. Oxygen requirement  decreased compared to yesterday. Continues to deny chest pain.  - Heparin per pharmacy  Hemoccult positive Possible melena Hemoglobin 16.1->15.4->14.1->13.8 this admission. Per GI, given patient's COVID-19 pneumonia, hypoxemia, acute PE, in the setting of no overt bleeding and normal hemoglobin, risk of EGD outweighs benefit. Recommend empiric treatment with twice daily PPI, close monitoring of hemoglobin, and eventual upper endoscopy and colonoscopy once medically appropriate.  - GI consulted, appreciate recs - Protonix 40 mg, Intravenous, Every 12 hours x 3 d then 40 mg PO BID if hemoglobin stable - Eventual EGD/colonoscopy depending on clinical course with COVID-19 pneumonia and acute PE - Monitor closely for signs/symptoms of bleeding - Monitor CBC  AKI Acute urinary retention Microscopic hematuria Creatinine stable at 1.60 this morning from 2.80 on admission with fluids. Patient able to void spontaneously yesterday, however reports difficulty spontaneously urinating this morning. Recurrence of suprapubic tenderness this morning. - AM BMP - Encourage PO fluids - Monitor for urinary retention with q6h bladder scans and I&O cath as necessary - Patient may require catheterization if continues to retain - Patient will need repeat urinalysis in 4 weeks given microscopic hematuria  Acute metabolic encephalopathy Delirium On admission, patient with some mild cognitive dysfunction manifested as slower speech with frequent pauses. Improved yesterday. This morning, patient's cognition again appears slowed and attention is impaired. Waxing and waning nature of inattentiveness suggestive of delirium in the setting of patient's COVID-19 infection. Will reach out to family to assess how his current mental status compares to his baseline. Wife is currently recovering from COVID. - Holding Lyrica as above - Fall precautions - Delirium precautions  Thrombocytopenia 71k this morning, stable from 70k  yesterday, down from 87k on admission. - Holding Lyrica - Continue to monitor  New-onset atrial fibrillation, resolved - Cardiac telemetry  Hypertension BP soft on admission, improved after fluids, SBP 110s-130s - Continue to hold lisinopril-HCTZ in setting of normotension and AKI  Rheumatoid arthritis - Will hold sulfasalazine for now given treatment with decadron   Diet: Heart Healthy IVF: none VTE: Heparin Code: Full PT/OT recs: SNF for Subacute PT  TOC recs: TBD   Please contact the on call pager after 5 pm and on weekends at [phone].  Alphonzo Severance, MD PGY-1 Internal Medicine Teaching Service Pager: [phone] 02/22/2020

## 2020-02-22 NOTE — Evaluation (Addendum)
Physical Therapy Evaluation Patient Details Name: Douglas [name] MRN: [id number] DOB: Jul 14, 1950 Today's Date: 02/22/2020   History of Present Illness  Pt is a 70 y.o. male who tested (+) COVID-19 on 02/13/20, now admitted 02/19/20 with SOB. Workup for acute respiratory failure with hypoxia due to COVID-19. VQ scan 1/4 wtih PE, heparin initiated. Concern for GIB; plan for conservative management. PMH includes RA, HTN, OA.    Clinical Impression  Pt presents with an overall decrease in functional mobility secondary to above. PTA, pt mod indep with SPC, does not work, lives with wife who is also recovering from [place]. Initiated education re: current condition, O2 needs, activity recommendations, and importance of mobility. Pt A&Ox4, but presents with impaired cognition, including poor attention, decreased awareness and difficulty problem solving; pt unable to figure out how to call wife on his cell phone, reports, "Thank you, I haven't been able to talk to her for weeks." Pt required mod-maxA (+2 safety) to stand and take steps to recliner; limited by decreased activity tolerance, generalized weakness and cognition. SpO2 down to mid-80s% on 6 L O2 HFNC. Pt would benefit from continued acute PT services to maximize functional mobility and independence prior to d/c with SNF-level therapies.    Follow Up Recommendations SNF;Supervision/Assistance - 24 hour    Equipment Recommendations   (TBD)    Recommendations for Other Services       Precautions / Restrictions Precautions Precautions: Fall;Other (comment) Precaution Comments: Flexiseal, urinary retention, watch SpO2 on 6L HFNC Restrictions Weight Bearing Restrictions: No      Mobility  Bed Mobility Overal bed mobility: Needs Assistance Bed Mobility: Supine to Sit     Supine to sit: Supervision;HOB elevated     General bed mobility comments: Supervision for safety/lines; use of bed rail, no physical assist required; verbal cues  to stay on task    Transfers Overall transfer level: Needs assistance Equipment used: 1 person hand held assist Transfers: Sit to/from [employer] Transfers Sit to Stand: Mod assist;+2 safety/equipment;Max assist Stand pivot transfers: Mod assist;+2 safety/equipment       General transfer comment: Multiple sit<>stands from EOB and recliner, pt with very poor awareness and easily distracted "I need to pee" - mod-maxA (+2 safety/lines from RN) to achieve upright and take pivotal steps to recliner  Ambulation/Gait             General Gait Details: No ambulation beyond pivotal steps to recliner this session; limited by fatigue, cognition and RN present to administer meds  Stairs            Wheelchair Mobility    Modified Rankin (Stroke Patients Only)       Balance Overall balance assessment: Needs assistance   Sitting balance-Leahy Scale: Fair Sitting balance - Comments: Scooting self to EOB well in attempt to use urinal     Standing balance-Leahy Scale: Poor Standing balance comment: Reliant on UE support and external assist                             Pertinent Vitals/Pain Pain Assessment: No/denies pain    Home Living Family/patient expects to be discharged to:: Private residence Living Arrangements: Spouse/significant other Available Help at Discharge: Family Type of Home: House       Home Layout: Two level;Able to live on main level with bedroom/bathroom Home Equipment: Kasandra Knudsen - single point;Other (comment) (lift chair) Additional Comments: Lives with wife who is also sick with COVID  Prior Function Level of Independence: Independent with assistive device(s)         Comments: Difficult historian -- mod indep with cane; uses lift chair; does not work, enjoys reading/watching TV     Hand Dominance        Extremity/Trunk Assessment   Upper Extremity Assessment Upper Extremity Assessment: Overall WFL for tasks  assessed;Difficult to assess due to impaired cognition    Lower Extremity Assessment Lower Extremity Assessment: Generalized weakness;Difficult to assess due to impaired cognition       Communication   Communication: No difficulties  Cognition Arousal/Alertness: Awake/alert Behavior During Therapy: Flat affect Overall Cognitive Status: No family/caregiver present to determine baseline cognitive functioning Area of Impairment: Attention;Memory;Following commands;Safety/judgement;Awareness;Problem solving                   Current Attention Level: Sustained;Selective Memory: Decreased short-term memory Following Commands: Follows one step commands with increased time;Follows one step commands inconsistently Safety/Judgement: Decreased awareness of safety;Decreased awareness of deficits Awareness: Intellectual;Emergent Problem Solving: Slow processing;Difficulty sequencing;Requires verbal cues General Comments: A&Ox4, although increased time to answer some questions. Appears accurate historian, but easily distracted and difficulty staying on topic. Unable to figure out how to call wife on his own cell phone requiring assist, stating "I haven't talked to her in weeks" (has been admitted since 02/19/20)      General Comments General comments (skin integrity, edema, etc.): SpO2 down to mid-80s on 6L O2 HFNC    Exercises     Assessment/Plan    PT Assessment Patient needs continued PT services  PT Problem List Decreased strength;Decreased activity tolerance;Decreased balance;Decreased mobility;Decreased cognition;Decreased knowledge of use of DME;Decreased safety awareness;Cardiopulmonary status limiting activity       PT Treatment Interventions DME instruction;Gait training;Stair training;Functional mobility training;Therapeutic activities;Therapeutic exercise;Balance training;Cognitive remediation;Patient/family education    PT Goals (Current goals can be found in the Care Plan  section)  Acute Rehab PT Goals Patient Stated Goal: "I need to pee" PT Goal Formulation: With patient Time For Goal Achievement: 03/07/20 Potential to Achieve Goals: Fair    Frequency Min 3X/week   Barriers to discharge        Co-evaluation               AM-PAC PT "6 Clicks" Mobility  Outcome Measure Help needed turning from your back to your side while in a flat bed without using bedrails?: A Little Help needed moving from lying on your back to sitting on the side of a flat bed without using bedrails?: A Little Help needed moving to and from a bed to a chair (including a wheelchair)?: A Lot Help needed standing up from a chair using your arms (e.g., wheelchair or bedside chair)?: A Lot Help needed to walk in hospital room?: A Lot Help needed climbing 3-5 steps with a railing? : A Lot 6 Click Score: 14    End of Session Equipment Utilized During Treatment: Oxygen;Gait belt Activity Tolerance: Patient tolerated treatment well Patient left: with call bell/phone within reach;with chair alarm set;in chair Nurse Communication: Mobility status PT Visit Diagnosis: Other abnormalities of gait and mobility (R26.89);Muscle weakness (generalized) (M62.81)    Time: 2094-7096 PT Time Calculation (min) (ACUTE ONLY): 29 min   Charges:   PT Evaluation $PT Eval Moderate Complexity: 1 Mod PT Treatments $Therapeutic Activity: 8-22 mins       Ina Homes, PT, DPT Acute Rehabilitation Services  Pager [phone] Office [phone]  Malachy Chamber 02/22/2020, 10:39 AM

## 2020-02-22 NOTE — Progress Notes (Signed)
Daily Rounding Note  02/22/2020, 8:56 AM  LOS: 3 days   SUBJECTIVE:   Chief complaint:   FOBT + stool   No black, tarry or bloody stools per RN  OBJECTIVE:         Vital signs in last 24 hours:    Temp:  [97.6 F (36.4 C)-97.9 F (36.6 C)] 97.6 F (36.4 C) (01/06 0738) Pulse Rate:  [71-79] 71 (01/06 0738) Resp:  [14-17] 16 (01/06 0738) BP: (125-155)/(75-89) 145/86 (01/06 0738) SpO2:  [90 %-93 %] 91 % (01/06 0738) Last BM Date: 02/21/20 Filed Weights   02/21/20 0300  Weight: 106.7 kg   Not re-examined.    Intake/Output from previous day: 01/05 0701 - 01/06 0700 In: -  Out: 375 [Urine:375]  Intake/Output this shift: Total I/O In: -  Out: 100 [Urine:100]  Lab Results: Recent Labs    02/20/20 0324 02/21/20 0728 02/22/20 0058  WBC 9.2 7.7 5.8  HGB 15.4 14.1 13.8  HCT 46.7 43.2 40.8  PLT 87* 70* 71*   BMET Recent Labs    02/20/20 0324 02/21/20 0728 02/22/20 0058  NA 138 138 139  K 4.6 4.5 4.7  CL 103 103 103  CO2 22 23 24   GLUCOSE 166* 123* 130*  BUN 108* 96* 83*  CREATININE 2.12* 1.64* 1.60*  CALCIUM 7.9* 8.1* 8.3*   LFT Recent Labs    02/20/20 0324 02/21/20 0728 02/22/20 0058  PROT 5.7* 5.4* 5.7*  ALBUMIN 2.9* 2.8* 2.9*  AST 34 28 21  ALT 18 16 16   ALKPHOS 27* 31* 31*  BILITOT 0.7 1.0 0.9   PT/INR Recent Labs    02/19/20 2019  LABPROT 13.7  INR 1.1   Hepatitis Panel No results for input(s): HEPBSAG, HCVAB, HEPAIGM, HEPBIGM in the last 72 hours.  Studies/Results: NM Pulmonary Perfusion  Result Date: 02/20/2020 CLINICAL DATA:  Shortness of breath and history of COVID-19 positivity EXAM: NUCLEAR MEDICINE PERFUSION LUNG SCAN TECHNIQUE: Perfusion images were obtained in multiple projections after intravenous injection of radiopharmaceutical. Ventilation scans intentionally deferred if perfusion scan and chest x-ray adequate for interpretation during COVID 19 epidemic.  RADIOPHARMACEUTICALS:  4.3 mCi Tc-12m MAA IV COMPARISON:  Chest x-ray from the previous day. FINDINGS: Uptake is noted bilaterally although multiple wedge-shaped defects are identified throughout both lungs consistent with pulmonary emboli. These appear greater than the associated parenchymal opacity seen on recent chest x-ray particularly in the right upper lobe. IMPRESSION: Changes consistent with pulmonary emboli. Electronically Signed   By: 04/19/2020 M.D.   On: 02/20/2020 16:01   Scheduled Meds: . brimonidine  1 drop Both Eyes BID  . dexamethasone  6 mg Oral Q24H  . famotidine  20 mg Oral BID  . fluticasone  2 spray Each Nare Daily  . latanoprost  1 drop Both Eyes QHS  . [START ON 02/24/2020] pantoprazole  40 mg Oral BID  . pantoprazole (PROTONIX) IV  40 mg Intravenous Q12H  . timolol  1 drop Both Eyes Daily   Continuous Infusions: . heparin 800 Units/hr (02/22/20 0321)  . remdesivir 100 mg in NS 100 mL Stopped (02/21/20 1115)   PRN Meds:.guaiFENesin-dextromethorphan, HYDROcodone-acetaminophen, polyethylene glycol   ASSESMENT:   *  Dark, FOBT + stool Day 2 PPI.   Hgb stable 14.1 >> 13.8.    *   Thrombocytopenia.  Stable.    *  Macrocytosis.  Need to check folate, B12.    *   Covid 19 PNA  *  Bil PE on IV Heparin.  Post ortho surgery DVT/PE several years ago.     PLAN   *   Anemia panel in AM.      Douglas [name]  02/22/2020, 8:56 AM Phone [phone]

## 2020-02-22 NOTE — Progress Notes (Signed)
CSW notes PT recommendation of SNF. Currently there are no COVID SNF beds available. CSW will follow up with patient's spouse.  Davarious Tumbleson LCSW

## 2020-02-22 NOTE — Progress Notes (Signed)
ANTICOAGULATION CONSULT NOTE - Follow Up Consult  Pharmacy Consult for heparin Indication: PE in setting of possible GIB  Labs: Recent Labs    02/19/20 2019 02/20/20 0324 02/20/20 2330 02/21/20 0728 02/21/20 1040 02/21/20 2106 02/22/20 0058 02/22/20 0910  HGB  --  15.4  --  14.1  --   --  13.8  --   HCT  --  46.7  --  43.2  --   --  40.8  --   PLT 87* 87*  --  70*  --   --  71*  --   APTT 30  --   --   --   --   --   --   --   LABPROT 13.7  --   --   --   --   --   --   --   INR 1.1  --   --   --   --   --   --   --   HEPARINUNFRC  --   --    < > 0.95*   < > 0.61 0.57 0.46  CREATININE  --  2.12*  --  1.64*  --   --  1.60*  --    < > = values in this interval not displayed.    Assessment: 70yo male on heparin 800 units/hr for acute bilateral PE.  Heparin level 0.46 is therapeutic.   D-dimer  trending down to 5.01 Heme positive stools were noted and  GI consulted 1/4.  GI noted no overt active bleeding and  recommended to monitor,  give twice daily PPI  (currently on IV protonix 40mg  q12hr) with plan for eventual EGD and colonoscopy.   Thrombocytopenia stable 87>70>71k, H/H within normal/ stable.  SCr trending down 1.60.   Goal of Therapy:  Heparin level 0.3-0.5 units/ml   Plan:  Continue Heparin IV drip at current rate 800 units/hr Daily HL, CBC Monitor s/sx bleeding - watch Plt trend   , RPh Clinical Pharmacist [phone] Please check AMION for all [facility] phone numbers After 10:00 PM, call Main Pharmacy [phone]   02/22/2020,11:19 AM

## 2020-02-22 NOTE — Evaluation (Signed)
Occupational Therapy Evaluation Patient Details Name: Douglas [name] MRN: [id number] DOB: [birth year] Today's Date: 02/22/2020    History of Present Illness Pt is a 70 y.o. male who tested (+) COVID-19 on 02/13/20, now admitted 02/19/19 with SOB. Workup for acute respiratory failure with hypoxia due to COVID-19. VQ scan 1/4 wtih PE, heparin initiated. Concern for GIB; plan for conservative management. PMH includes RA, HTN, OA.   Clinical Impression   Pt admitted with above. He demonstrates the below listed deficits and will benefit from continued OT to maximize safety and independence with BADLs.  Pt presents to OT with generalized weakness, decreased activity tolearnce, increased pain, impaired balance, as well as cognitive deficits that include delayed processing, impaired problem solving, decreased attention, decreased safety awareness.  He currently requires min A - total A for ADLs and mod A +2 for functional transfers.  He reports he lives with wife and was fairly independent PTA, but did require intermittent assist with ADLs.  Given his current physical needs coupled with his cognitive deficits, doubt his wife will be able to safely assist him at home as he requires +2 assist and his processing speed and safety awareness are impaired.  Recommend SNF at this time.      Follow Up Recommendations  SNF;Supervision/Assistance - 24 hour    Equipment Recommendations  3 in 1 bedside commode;Wheelchair (measurements OT);Hospital bed;Wheelchair cushion (measurements OT)    Recommendations for Other Services       Precautions / Restrictions Precautions Precautions: Fall Precaution Comments: Flexiseal, urinary retention, watch SpO2 on 6L HFNC      Mobility Bed Mobility               General bed mobility comments: Pt sitting up in recliner    Transfers Overall transfer level: Needs assistance Equipment used: Rolling walker (2 wheeled);1 person hand held assist Transfers: Sit  to/from Stand Sit to Stand: Mod assist         General transfer comment: Pt requires mod assist/cues for walker placement and hand placment.  He has a heavy forward lean with poor awarenss and inabilty to self right    Balance Overall balance assessment: Needs assistance   Sitting balance-Leahy Scale: Fair       Standing balance-Leahy Scale: Poor Standing balance comment: reliant on UE support and mod A                           ADL either performed or assessed with clinical judgement   ADL Overall ADL's : Needs assistance/impaired Eating/Feeding: Set up;Sitting   Grooming: Wash/dry hands;Wash/dry face;Oral care;Brushing hair;Minimal assistance;Sitting   Upper Body Bathing: Moderate assistance;Sitting   Lower Body Bathing: Maximal assistance;Sit to/from stand   Upper Body Dressing : Moderate assistance;Sitting   Lower Body Dressing: Total assistance;Sit to/from stand   Toilet Transfer: Moderate assistance;+2 for safety/equipment;Stand-pivot;BSC;RW   Toileting- Clothing Manipulation and Hygiene: Total assistance;Sit to/from stand       Functional mobility during ADLs: Moderate assistance;+2 for physical assistance;+2 for safety/equipment;Rolling walker General ADL Comments: pt fatigues with activity.  requires mod A for balance and second person for safety     Vision Patient Visual Report: No change from baseline       Perception     Praxis      Pertinent Vitals/Pain Pain Assessment: Faces Faces Pain Scale: Hurts even more Pain Location: back and hips Pain Descriptors / Indicators: Grimacing;Guarding;Aching Pain Intervention(s): Monitored during session;Repositioned;Patient requesting pain meds-RN  notified;RN gave pain meds during session     Hand Dominance     Extremity/Trunk Assessment Upper Extremity Assessment Upper Extremity Assessment: Generalized weakness (arthritic deformity noted bil hands.  He reports long standing deficit with pinch  strength and grasp bil.)   Lower Extremity Assessment Lower Extremity Assessment: Defer to PT evaluation   Cervical / Trunk Assessment Cervical / Trunk Assessment: Kyphotic   Communication Communication Communication: Expressive difficulties (pt with difficulty with word finding and with completing sentences)   Cognition Arousal/Alertness: Awake/alert Behavior During Therapy: Flat affect;Impulsive Overall Cognitive Status: Impaired/Different from baseline Area of Impairment: Attention;Memory;Following commands;Safety/judgement;Awareness;Problem solving                   Current Attention Level: Sustained (sustained attention with cues - self distracts) Memory: Decreased short-term memory Following Commands: Follows one step commands consistently;Follows one step commands with increased time Safety/Judgement: Decreased awareness of deficits;Decreased awareness of safety Awareness: Intellectual Problem Solving: Slow processing;Decreased initiation;Difficulty sequencing;Requires verbal cues;Requires tactile cues General Comments: Pt is very slow to initiate activity and conversation.  He has difficulty with word finding at times, and looses his attention and unable to consistently complete his thoughts.  He has poor awareness of deficits and becomes frustrated with therapist when he is provided with instruction for problem solving.   General Comments  Sp02 89% with activity with pt on 6L    Exercises     Shoulder Instructions      Home Living Family/patient expects to be discharged to:: Private residence Living Arrangements: Spouse/significant other Available Help at Discharge: Family Type of Home: House       Home Layout: Two level;Able to live on main level with bedroom/bathroom     Bathroom Shower/Tub: Walk-in shower         Home Equipment: Kasandra Knudsen - single point;Other (comment)   Additional Comments: Lives with wife who is also sick with COVID      Prior  Functioning/Environment Level of Independence: Needs assistance  Gait / Transfers Assistance Needed: Pt reports he ambulates with cane ADL's / Homemaking Assistance Needed: Pt reports he does not wear socks as he is unable to access feet - has a sock aid, but unable to use it.  He does ADLs mostly mod I, but does require intermittent assist due to pain and arthritis.  He reports he dons his pants supine   Comments: unreliable historian due to confusion        OT Problem List: Decreased strength;Decreased activity tolerance;Impaired balance (sitting and/or standing);Decreased coordination;Decreased cognition;Decreased safety awareness;Decreased knowledge of use of DME or AE;Cardiopulmonary status limiting activity;Impaired UE functional use;Pain      OT Treatment/Interventions: Self-care/ADL training;Therapeutic exercise;Energy conservation;DME and/or AE instruction;Therapeutic activities;Cognitive remediation/compensation;Patient/family education;Balance training    OT Goals(Current goals can be found in the care plan section) Acute Rehab OT Goals Patient Stated Goal: for back to stop hurting OT Goal Formulation: With patient Time For Goal Achievement: 03/07/20 Potential to Achieve Goals: Good ADL Goals Pt Will Perform Grooming: with min assist;standing Pt Will Perform Upper Body Bathing: with set-up;with supervision;sitting Pt Will Perform Lower Body Bathing: with min assist;sit to/from stand;with adaptive equipment Pt Will Perform Upper Body Dressing: with set-up;with supervision;sitting Pt Will Perform Lower Body Dressing: with min assist;with adaptive equipment;sit to/from stand Pt Will Transfer to Toilet: with min assist;ambulating;regular height toilet;bedside commode;grab bars Pt Will Perform Toileting - Clothing Manipulation and hygiene: with min assist;sit to/from stand Pt/caregiver will Perform Home Exercise Program: Increased strength;With Supervision;With minimal assist;With  written HEP provided Additional ADL Goal #1: pt will be able to selectively attend to simple ADL tasks x 5 mins with no cues  OT Frequency: Min 3X/week   Barriers to D/C:    unsure wife is able to provide necessary level of assist       Co-evaluation              AM-PAC OT "6 Clicks" Daily Activity     Outcome Measure Help from another person eating meals?: A Little Help from another person taking care of personal grooming?: Total Help from another person toileting, which includes using toliet, bedpan, or urinal?: A Lot Help from another person bathing (including washing, rinsing, drying)?: A Lot Help from another person to put on and taking off regular upper body clothing?: A Lot Help from another person to put on and taking off regular lower body clothing?: Total 6 Click Score: 11   End of Session Equipment Utilized During Treatment: Gait belt;Rolling walker;Oxygen Nurse Communication: Mobility status  Activity Tolerance: Patient limited by fatigue Patient left: in chair;with call bell/phone within reach;with chair alarm set  OT Visit Diagnosis: Unsteadiness on feet (R26.81);Pain;Cognitive communication deficit (R41.841) Pain - part of body:  (back)                Time: [id number] OT Time Calculation (min): 49 min Charges:  OT General Charges $OT Visit: 1 Visit OT Evaluation $OT Eval Moderate Complexity: 1 Mod OT Treatments $Therapeutic Activity: 23-37 mins  Nilsa Nutting., OTR/L Acute Rehabilitation Services Pager [phone] Office [place], [place] 02/22/2020, 4:45 PM

## 2020-02-23 LAB — RETICULOCYTES
Immature Retic Fract: 26.2 % — ABNORMAL HIGH (ref 2.3–15.9)
RBC.: 4.23 MIL/uL (ref 4.22–5.81)
Retic Count, Absolute: 142.1 10*3/uL (ref 19.0–186.0)
Retic Ct Pct: 3.4 % — ABNORMAL HIGH (ref 0.4–3.1)

## 2020-02-23 LAB — COMPREHENSIVE METABOLIC PANEL
ALT: 15 U/L (ref 0–44)
AST: 21 U/L (ref 15–41)
Albumin: 3.1 g/dL — ABNORMAL LOW (ref 3.5–5.0)
Alkaline Phosphatase: 32 U/L — ABNORMAL LOW (ref 38–126)
Anion gap: 11 (ref 5–15)
BUN: 63 mg/dL — ABNORMAL HIGH (ref 8–23)
CO2: 25 mmol/L (ref 22–32)
Calcium: 8.8 mg/dL — ABNORMAL LOW (ref 8.9–10.3)
Chloride: 107 mmol/L (ref 98–111)
Creatinine, Ser: 1.46 mg/dL — ABNORMAL HIGH (ref 0.61–1.24)
GFR, Estimated: 52 mL/min — ABNORMAL LOW (ref >60)
Glucose, Bld: 92 mg/dL (ref 70–99)
Potassium: 4.3 mmol/L (ref 3.5–5.1)
Sodium: 143 mmol/L (ref 135–145)
Total Bilirubin: 0.8 mg/dL (ref 0.3–1.2)
Total Protein: 5.9 g/dL — ABNORMAL LOW (ref 6.5–8.1)

## 2020-02-23 LAB — CBC WITH DIFFERENTIAL/PLATELET
Abs Immature Granulocytes: 0.03 10*3/uL (ref 0.00–0.07)
Basophils Absolute: 0 10*3/uL (ref 0.0–0.1)
Basophils Relative: 0 %
Eosinophils Absolute: 0 10*3/uL (ref 0.0–0.5)
Eosinophils Relative: 0 %
HCT: 41.5 % (ref 39.0–52.0)
Hemoglobin: 14.3 g/dL (ref 13.0–17.0)
Immature Granulocytes: 1 %
Lymphocytes Relative: 19 %
Lymphs Abs: 1.3 10*3/uL (ref 0.7–4.0)
MCH: 34.3 pg — ABNORMAL HIGH (ref 26.0–34.0)
MCHC: 34.5 g/dL (ref 30.0–36.0)
MCV: 99.5 fL (ref 80.0–100.0)
Monocytes Absolute: 0.6 10*3/uL (ref 0.1–1.0)
Monocytes Relative: 9 %
Neutro Abs: 4.7 10*3/uL (ref 1.7–7.7)
Neutrophils Relative %: 71 %
Platelets: 74 10*3/uL — ABNORMAL LOW (ref 150–400)
RBC: 4.17 MIL/uL — ABNORMAL LOW (ref 4.22–5.81)
RDW: 13.8 % (ref 11.5–15.5)
WBC: 6.6 10*3/uL (ref 4.0–10.5)
nRBC: 0.5 % — ABNORMAL HIGH (ref 0.0–0.2)

## 2020-02-23 LAB — FERRITIN: Ferritin: 473 ng/mL — ABNORMAL HIGH (ref 24–336)

## 2020-02-23 LAB — HEPARIN LEVEL (UNFRACTIONATED)
Heparin Unfractionated: 0.31 IU/mL (ref 0.30–0.70)
Heparin Unfractionated: 0.44 IU/mL (ref 0.30–0.70)

## 2020-02-23 LAB — IRON AND TIBC
Iron: 62 ug/dL (ref 45–182)
Saturation Ratios: 28 % (ref 17.9–39.5)
TIBC: 220 ug/dL — ABNORMAL LOW (ref 250–450)
UIBC: 158 ug/dL

## 2020-02-23 LAB — C-REACTIVE PROTEIN: CRP: 0.9 mg/dL (ref <1.0)

## 2020-02-23 LAB — FOLATE: Folate: 12.6 ng/mL (ref >5.9)

## 2020-02-23 LAB — VITAMIN B12: Vitamin B-12: 345 pg/mL (ref 180–914)

## 2020-02-23 MED ORDER — PANTOPRAZOLE SODIUM 40 MG PO TBEC
40.0000 mg | DELAYED_RELEASE_TABLET | Freq: Two times a day (BID) | ORAL | Status: DC
  Administered 2020-02-23 – 2020-02-28 (×10): 40 mg via ORAL
  Filled 2020-02-23 (×10): qty 1

## 2020-02-23 MED ORDER — TAMSULOSIN HCL 0.4 MG PO CAPS: 0.4000 mg | ORAL_CAPSULE | Freq: Every day | ORAL | Status: DC

## 2020-02-23 MED ORDER — TAMSULOSIN HCL 0.4 MG PO CAPS
0.4000 mg | ORAL_CAPSULE | Freq: Every day | ORAL | Status: DC
  Administered 2020-02-23 – 2020-02-28 (×6): 0.4 mg via ORAL
  Filled 2020-02-23 (×6): qty 1

## 2020-02-23 MED ORDER — CHLORHEXIDINE GLUCONATE CLOTH 2 % EX PADS
6.0000 | MEDICATED_PAD | Freq: Every day | CUTANEOUS | Status: DC
  Administered 2020-02-24 – 2020-02-28 (×5): 6 via TOPICAL

## 2020-02-23 NOTE — Progress Notes (Signed)
ANTICOAGULATION CONSULT NOTE - Follow Up Consult  Pharmacy Consult for IV Heparin Indication: PE in setting of possible GIB  Labs: Recent Labs    02/21/20 0728 02/21/20 1040 02/22/20 0058 02/22/20 0910 02/23/20 0123 02/23/20 1811  HGB 14.1  --  13.8  --  14.3  --   HCT 43.2  --  40.8  --  41.5  --   PLT 70*  --  71*  --  74*  --   HEPARINUNFRC 0.95*   < > 0.57 0.46 0.31 0.44  CREATININE 1.64*  --  1.60*  --  1.46*  --    < > = values in this interval not displayed.    Assessment: 70 yo male on heparin 900 units/hr for acute bilateral PE.    Heme positive stools were noted and GI was consulted 1/4.  GI noted no overt active bleeding and recommended to monitor,  twice daily PPI  (currently on IV protonix 40 mg q 12 hr) with plan for eventual EGD and colonoscopy (timing TBD, not tomorrow).     Heparin level ~9 hrs after heparin infusion was increased to 900 units/hr was 0.44 units/ml, which remains within the goal range for this pt.  H/H 14.3/141.5, platelets 74, CBC stable - thrombocytopenia noted, seems to be chronic/stable. Per RN, no issues with IV or bleeding observed.  Goal of Therapy:  Heparin level 0.3-0.5 units/ml   Plan:  Continue heparin heparin infusion at 900 units/hr Check heparin level in 6 hrs Monitor daily heparin level, CBC Monitor for signs/symptoms of bleeding F/U plans for oral anticoagulation  Gillermina Hu, PharmD, BCPS, [facility] Clinical Pharmacist 02/23/2020 6:58 PM

## 2020-02-23 NOTE — Hospital Course (Signed)
[phone] Wife Hilda Blades Phone number

## 2020-02-23 NOTE — Progress Notes (Signed)
ANTICOAGULATION CONSULT NOTE - Follow Up Consult  Pharmacy Consult for heparin Indication: PE in setting of possible GIB  Labs: Recent Labs    02/21/20 0728 02/21/20 1040 02/22/20 0058 02/22/20 0910 02/23/20 0123  HGB 14.1  --  13.8  --  14.3  HCT 43.2  --  40.8  --  41.5  PLT 70*  --  71*  --  74*  HEPARINUNFRC 0.95*   < > 0.57 0.46 0.31  CREATININE 1.64*  --  1.60*  --  1.46*   < > = values in this interval not displayed.    Assessment: 70yo male on heparin 800 units/hr for acute bilateral PE.    Heme positive stools were noted and  GI consulted 1/4.  GI noted no overt active bleeding and  recommended to monitor,  give twice daily PPI  (currently on IV protonix 40mg  q12hr) with plan for eventual EGD and colonoscopy.     Hep lvl at 0.31 today - low end of goal  H&H remains good - TCP noted, seems to be chronic/stable  Goal of Therapy:  Heparin level 0.3-0.5 units/ml   Plan:  Slightly increase heparin to 900 units/hr Recheck at 1800 to ensure remains within goal Daily hep lvl cbc F/u plans for oral [facility]  Barth Kirks, PharmD, BCPS, BCCCP Clinical Pharmacist  Please check AMION for all [place] numbers  02/23/2020 8:54 AM

## 2020-02-23 NOTE — Progress Notes (Addendum)
          Daily Rounding Note  02/23/2020, 10:34 AM  LOS: 4 days   SUBJECTIVE:   Chief complaint: dark stools, FOBT +     No BM's > 24 hours.  No N/V.  Tolerating solid food.  No abd pain  + SOB and weak.  Waxing waning mental status.    OBJECTIVE:         Vital signs in last 24 hours:    Temp:  [97.4 F (36.3 C)-98.5 F (36.9 C)] 98.5 F (36.9 C) (01/07 0740) Pulse Rate:  [63-69] 63 (01/07 0740) Resp:  [15-24] 19 (01/07 0740) BP: (129-155)/(77-89) 155/79 (01/07 0740) SpO2:  [91 %-96 %] 95 % (01/07 0740) Last BM Date: 02/22/20 Filed Weights   02/21/20 0300  Weight: 106.7 kg     Intake/Output from previous day: 01/06 0701 - 01/07 0700 In: 459.3 [I.V.:333.8; IV Piggyback:125.6] Out: 1025 [Urine:1025]  Intake/Output this shift: Total I/O In: -  Out: 300 [Urine:300]  Lab Results: Recent Labs    02/21/20 0728 02/22/20 0058 02/23/20 0123  WBC 7.7 5.8 6.6  HGB 14.1 13.8 14.3  HCT 43.2 40.8 41.5  PLT 70* 71* 74*   BMET Recent Labs    02/21/20 0728 02/22/20 0058 02/23/20 0123  NA 138 139 143  K 4.5 4.7 4.3  CL 103 103 107  CO2 23 24 25   GLUCOSE 123* 130* 92  BUN 96* 83* 63*  CREATININE 1.64* 1.60* 1.46*  CALCIUM 8.1* 8.3* 8.8*   LFT Recent Labs    02/21/20 0728 02/22/20 0058 02/23/20 0123  PROT 5.4* 5.7* 5.9*  ALBUMIN 2.8* 2.9* 3.1*  AST 28 21 21   ALT 16 16 15   ALKPHOS 31* 31* 32*  BILITOT 1.0 0.9 0.8   PT/INR No results for input(s): LABPROT, INR in the last 72 hours. Hepatitis Panel No results for input(s): HEPBSAG, HCVAB, HEPAIGM, HEPBIGM in the last 72 hours.  Studies/Results: No results found.  ASSESMENT:   *   FOBT + dark stool PTA.  Day 3 PPI.  Hgb improving, no anemia.   FIT test + 11/2018.   Day 3 IV Protonix  *    Macrocytosis w/o anemia.  B12, folate, iron, ferritin at or above normal.  TIBC low at 220 (normal is 250 - 450).  Macrocytosis noted on labs of 05/2019.    *     Thrombocytopenia.  Platelets stable in 90s. were 144 on 06/05/2019         *    COVID-19 pneumonia. Initial FOBT + on 12/28.  Day 5 Remdesivir/Dexamethasone.  Down trending inflammatory markers.    *     Bilateral PE day 3 V heparin.  *   AMS.  Waxing/waning cognition.  Baseline not known.     PLAN   *   Switch now to Protonix 40 po bid.   Continue HH diet.    *   Will check back on Monday, Dr Hilarie Fredrickson available over w/e if needed.         Douglas [name]  02/23/2020, 10:34 AM Phone [phone]

## 2020-02-23 NOTE — Progress Notes (Signed)
HD#4 Subjective:   No acute events overnight. Per nursing, patient has been using his bedside urinal to void without issue.  During evaluation at bedside, currently not wearing his oxygen, satting 86-89%. Placed back on 5 L HFNC, saturations ~91%. When asked how he is feeling, he groans and states he has to pee. He also says he has been able to urinate but feels as though he is unable to "get it all out." Upon further clarification, states he has not been able to urinate this morning. He is not aware of having been on a medicine to help him urinate in the past. Denies respiratory symptoms, chest pain.   Patient shared his wife's cell phone number so was able to speak with his wife this afternoon. She reports she has not spoken to her husband since early on in his admission, though notes that her son has connected with him and reports he seemed "slow." Wife reports the patient is ordinarily "with it," is able to take care of his ADLs and iADLs. He has been on disability for the last 8-9 years due to his rheumatoid arthritis. Updated her, and she expressed understanding of his ongoing medical problems.  Objective:   Vital signs in last 24 hours: Vitals:   02/22/20 1714 02/22/20 2022 02/23/20 0014 02/23/20 0345  BP: 139/89 133/82 129/77 139/84  Pulse: 64 66 64 69  Resp: (!) 24 15 17 18   Temp: (!) 97.4 F (36.3 C) 97.7 F (36.5 C) 98.4 F (36.9 C) 98.5 F (36.9 C)  TempSrc: Oral Oral Axillary Axillary  SpO2: 91% 91% 94% 92%  Weight:      Height:       Supplemental O2: HFNC SpO2: 92 % O2 Flow Rate (L/min): 5 L/min  Physical Exam Constitutional: tired- and ill-appearing man sitting in bedside chair, moans periodically, appears uncomfortable Cardiovascular: regular rate and rhythm, no m/r/g, no lower extremity edema Pulmonary/Chest: mild dyspnea with speaking on 5 L HFNC, exam stable from prior Abdominal: mildy distended Neurological/Psych: oriented to person, place, time, and  situation, speech and thought process slow, patient unable to subtract 7 from 100 or say the months of the year backwards, when asked his wife's phone number he repeats "[phone]" over and over and then cannot remember what he is asked  Pertinent Labs: CBC Latest Ref Rng & Units 02/23/2020 02/22/2020 02/21/2020  WBC 4.0 - 10.5 K/uL 6.6 5.8 7.7  Hemoglobin 13.0 - 17.0 g/dL 14.3 13.8 14.1  Hematocrit 39.0 - 52.0 % 41.5 40.8 43.2  Platelets 150 - 400 K/uL 74(L) 71(L) 70(L)    CMP Latest Ref Rng & Units 02/23/2020 02/22/2020 02/21/2020  Glucose 70 - 99 mg/dL 92 130(H) 123(H)  BUN 8 - 23 mg/dL 63(H) 83(H) 96(H)  Creatinine 0.61 - 1.24 mg/dL 1.46(H) 1.60(H) 1.64(H)  Sodium 135 - 145 mmol/L 143 139 138  Potassium 3.5 - 5.1 mmol/L 4.3 4.7 4.5  Chloride 98 - 111 mmol/L 107 103 103  CO2 22 - 32 mmol/L 25 24 23   Calcium 8.9 - 10.3 mg/dL 8.8(L) 8.3(L) 8.1(L)  Total Protein 6.5 - 8.1 g/dL 5.9(L) 5.7(L) 5.4(L)  Total Bilirubin 0.3 - 1.2 mg/dL 0.8 0.9 1.0  Alkaline Phos 38 - 126 U/L 32(L) 31(L) 31(L)  AST 15 - 41 U/L 21 21 28   ALT 0 - 44 U/L 15 16 16     Assessment/Plan:   Principal Problem:   Pneumonia due to COVID-19 virus Active Problems:   AKI (acute kidney injury) ([place])  Thrombocytopenia ([place])   Rheumatoid arthritis ([place])   Acute urinary retention   Acute hypoxemic respiratory failure ([facility])   Atrial fibrillation with rapid ventricular response ([facility])   Heme positive stool   Microscopic hematuria   Patient Summary: Douglas [name] is a 70 y.o. man with with a pertinent PMH of rheumatoid arthritis, hypertension, osteoarthritis, who presented with shortness of breath after recent diagnosis of COVID-19 via PCR on 02/13/20 and was admitted for further evaluation and treatment of acute respiratory failure with hypoxia.  This is hospital day 4.  Acute respiratory failure with hypoxia due to COVID-19 Unvaccinated. Positive PCR test on 02/13/20, today is day 10 since diagnosis. On 5 L HFNC to  maintain saturations >90%. Inflammatory markers downtrending. - on day 5/5 of remdesivir (Day 1: 02/19/20) - on day 5/10 of dexamethasone (Day 1: 02/19/20) - currently on 5 L HFNC; wean as able - IS, flutter valve  - antitussives - self prone when able  - Trend inflammatory markers daily  Pulmonary embolism Patient initiated on heparin per pharmacy. Oxygen requirement stable. Continues to deny chest pain.  - Heparin per pharmacy  Hemoccult positive Possible melena Hemoglobin 16.1->15.4->14.1->13.8->14.3 this admission. Per GI, given patient's COVID-19 pneumonia, hypoxemia, acute PE, in the setting of no overt bleeding and normal hemoglobin, risk of EGD outweighs benefit. Recommend empiric treatment with twice daily PPI, close monitoring of hemoglobin, and eventual upper endoscopy and colonoscopy once medically appropriate.  - GI consulted, appreciate recs - switch to Protonix 40 mg PO BID since hemoglobin is stable - Eventual EGD/colonoscopy depending on clinical course with COVID-19 pneumonia and acute PE - Monitor closely for signs/symptoms of bleeding - Monitor CBC  AKI Acute urinary retention Microscopic hematuria Creatinine 1.46 this morning from 2.80 on admission with fluids. Patient with intermittent ability to void spontaneously. Post-attempted void 719 cc -> 1100 cc by I&O. Will start Flomax. Will monitor for urinary retention and if requires I&O cath will place foley.  - AM BMP - Encourage PO fluids - q6h bladder scans. - Patient has required 2 I&O caths, will place foley if next PVR >300 cc and I&O cath as necessary - Patient will need repeat urinalysis in 4 weeks given microscopic hematuria  Acute metabolic encephalopathy Delirium Patient again with waxing and waning attention this morning. Able to get in touch with wife who has not been in contact with the patient for the last couple of days but states description of his mental status is not his baseline at home. Encouraged  her to attempt to speak with him daily. She is currently recovering from [place] as above - Fall precautions - Delirium precautions  Thrombocytopenia 74k this morning, stable from yesterday, down from 87k on admission. - Holding Lyrica - Continue to monitor  New-onset atrial fibrillation, resolved - Cardiac telemetry  Hypertension BP soft on admission, improved after fluids, SBP 130s-140s - Continue to hold lisinopril-HCTZ in setting of normotension and AKI - Start Flomax for urinary retention as above  Rheumatoid arthritis - Will hold sulfasalazine for now given treatment with decadron   Diet: Heart Healthy IVF: none VTE: Heparin Code: Full PT/OT recs: SNF for Subacute PT  TOC recs: TBD   Please contact the on call pager after 5 pm and on weekends at [phone].  [place]andria Lodge, MD PGY-1 Internal Medicine Teaching Service Pager: [phone] 02/23/2020

## 2020-02-24 LAB — CBC WITH DIFFERENTIAL/PLATELET
Abs Immature Granulocytes: 0.03 10*3/uL (ref 0.00–0.07)
Basophils Absolute: 0 10*3/uL (ref 0.0–0.1)
Basophils Relative: 0 %
Eosinophils Absolute: 0 10*3/uL (ref 0.0–0.5)
Eosinophils Relative: 0 %
HCT: 39.4 % (ref 39.0–52.0)
Hemoglobin: 13.6 g/dL (ref 13.0–17.0)
Immature Granulocytes: 1 %
Lymphocytes Relative: 16 %
Lymphs Abs: 0.9 10*3/uL (ref 0.7–4.0)
MCH: 34.4 pg — ABNORMAL HIGH (ref 26.0–34.0)
MCHC: 34.5 g/dL (ref 30.0–36.0)
MCV: 99.7 fL (ref 80.0–100.0)
Monocytes Absolute: 0.5 10*3/uL (ref 0.1–1.0)
Monocytes Relative: 9 %
Neutro Abs: 4 10*3/uL (ref 1.7–7.7)
Neutrophils Relative %: 74 %
Platelets: 70 10*3/uL — ABNORMAL LOW (ref 150–400)
RBC: 3.95 MIL/uL — ABNORMAL LOW (ref 4.22–5.81)
RDW: 13.6 % (ref 11.5–15.5)
WBC: 5.4 10*3/uL (ref 4.0–10.5)
nRBC: 0 % (ref 0.0–0.2)

## 2020-02-24 LAB — COMPREHENSIVE METABOLIC PANEL
ALT: 15 U/L (ref 0–44)
AST: 17 U/L (ref 15–41)
Albumin: 2.9 g/dL — ABNORMAL LOW (ref 3.5–5.0)
Alkaline Phosphatase: 29 U/L — ABNORMAL LOW (ref 38–126)
Anion gap: 11 (ref 5–15)
BUN: 47 mg/dL — ABNORMAL HIGH (ref 8–23)
CO2: 25 mmol/L (ref 22–32)
Calcium: 8.7 mg/dL — ABNORMAL LOW (ref 8.9–10.3)
Chloride: 107 mmol/L (ref 98–111)
Creatinine, Ser: 1.21 mg/dL (ref 0.61–1.24)
GFR, Estimated: >60 mL/min (ref >60)
Glucose, Bld: 107 mg/dL — ABNORMAL HIGH (ref 70–99)
Potassium: 4 mmol/L (ref 3.5–5.1)
Sodium: 143 mmol/L (ref 135–145)
Total Bilirubin: 1.2 mg/dL (ref 0.3–1.2)
Total Protein: 5.5 g/dL — ABNORMAL LOW (ref 6.5–8.1)

## 2020-02-24 LAB — CULTURE, BLOOD (ROUTINE X 2)
Culture: NO GROWTH
Special Requests: ADEQUATE

## 2020-02-24 LAB — HEPARIN LEVEL (UNFRACTIONATED)
Heparin Unfractionated: 0.15 IU/mL — ABNORMAL LOW (ref 0.30–0.70)
Heparin Unfractionated: 0.43 IU/mL (ref 0.30–0.70)

## 2020-02-24 LAB — D-DIMER, QUANTITATIVE: D-Dimer, Quant: 6.05 ug/mL-FEU — ABNORMAL HIGH (ref 0.00–0.50)

## 2020-02-24 LAB — C-REACTIVE PROTEIN: CRP: 0.9 mg/dL (ref <1.0)

## 2020-02-24 NOTE — Progress Notes (Signed)
ANTICOAGULATION CONSULT NOTE - Follow Up Consult  Pharmacy Consult for IV Heparin Indication: PE in setting of possible GIB  Labs: Recent Labs    02/22/20 0058 02/22/20 0910 02/23/20 0123 02/23/20 1811 02/24/20 0110 02/24/20 0813  HGB 13.8  --  14.3  --  13.6  --   HCT 40.8  --  41.5  --  39.4  --   PLT 71*  --  74*  --  70*  --   HEPARINUNFRC 0.57   < > 0.31 0.44 0.15* 0.43  CREATININE 1.60*  --  1.46*  --  1.21  --    < > = values in this interval not displayed.    Assessment: 70 yo male on heparin 900 units/hr for acute bilateral PE.    Heme positive stools were noted and GI was consulted 1/4.  GI noted no overt active bleeding and recommended to monitor,  twice daily PPI  (currently on IV protonix 40 mg q 12 hr) with plan for eventual EGD and colonoscopy (timing TBD, not tomorrow).    Heparin level now 0.43 - within target range.  Goal of Therapy:  Heparin level 0.3-0.5 units/ml   Plan:  Continue heparin heparin infusion at 900 units/hr Will recheck heparin level with am labs  Alanda Slim, PharmD, [place] Pharmacist Please see AMION for all Pharmacists' Contact Phone Numbers 02/24/2020, 12:47 PM

## 2020-02-24 NOTE — Progress Notes (Signed)
HD#5 Subjective:   No acute events overnight. Patient reportedly took his oxygen off last night  During evaluation at bedside, patient states he is doing "ok, sleepy." States he is feeling better in terms of his breathing, maintaining saturations on 4 L [place]. Is tolerating the foley with improvement in previous abdominal discomfort. Still with slowed responses but improved from yesterday. Denies chest pain, nausea, vomiting.   Objective:   Vital signs in last 24 hours: Vitals:   02/24/20 0336 02/24/20 0744 02/24/20 1129 02/24/20 1631  BP: 126/71 130/75 137/80 130/72  Pulse: (!) 58 64 60 63  Resp: 13 11 17 11   Temp: (!) 97.5 F (36.4 C) (!) 97.4 F (36.3 C) 97.6 F (36.4 C) 97.8 F (36.6 C)  TempSrc: Oral Oral Oral Axillary  SpO2: 93% 90% 95% 92%  Weight:      Height:       Supplemental O2: HFNC SpO2: 92 % O2 Flow Rate (L/min): 4 L/min  Physical Exam Constitutional: tired- and ill-appearing man sitting up in bed, in no acute distress Cardiovascular: regular rate and rhythm, no m/r/g, no lower extremity edema Pulmonary/Chest: minimal dyspnea with speaking on 4 L [place], exam otherwise stable from prior Abdominal: soft, non-tender, minimally distended Neurological/Psych: oriented to person, place, time, and situation, speech and thought process faster than yesterday, patient still unable to subtract 7 from 100 or say the months of the year backwards  Pertinent Labs: CBC Latest Ref Rng & Units 02/24/2020 02/23/2020 02/22/2020  WBC 4.0 - 10.5 K/uL 5.4 6.6 5.8  Hemoglobin 13.0 - 17.0 g/dL 13.6 14.3 13.8  Hematocrit 39.0 - 52.0 % 39.4 41.5 40.8  Platelets 150 - 400 K/uL 70(L) 74(L) 71(L)    CMP Latest Ref Rng & Units 02/24/2020 02/23/2020 02/22/2020  Glucose 70 - 99 mg/dL 107(H) 92 130(H)  BUN 8 - 23 mg/dL 47(H) 63(H) 83(H)  Creatinine 0.61 - 1.24 mg/dL 1.21 1.46(H) 1.60(H)  Sodium 135 - 145 mmol/L 143 143 139  Potassium 3.5 - 5.1 mmol/L 4.0 4.3 4.7  Chloride 98 - 111 mmol/L 107 107  103  CO2 22 - 32 mmol/L 25 25 24   Calcium 8.9 - 10.3 mg/dL 8.7(L) 8.8(L) 8.3(L)  Total Protein 6.5 - 8.1 g/dL 5.5(L) 5.9(L) 5.7(L)  Total Bilirubin 0.3 - 1.2 mg/dL 1.2 0.8 0.9  Alkaline Phos 38 - 126 U/L 29(L) 32(L) 31(L)  AST 15 - 41 U/L 17 21 21   ALT 0 - 44 U/L 15 15 16     Assessment/Plan:   Principal Problem:   Pneumonia due to COVID-19 virus Active Problems:   AKI (acute kidney injury) ([place])   Thrombocytopenia (HCC)   Rheumatoid arthritis (HCC)   Acute urinary retention   Acute hypoxemic respiratory failure (HCC)   Atrial fibrillation with rapid ventricular response (HCC)   Heme positive stool   Microscopic hematuria   Patient Summary: Douglas [name] is a 70 y.o. man with with a pertinent PMH of rheumatoid arthritis, hypertension, osteoarthritis, who presented with shortness of breath after recent diagnosis of COVID-19 via PCR on 02/13/20 and was admitted for further evaluation and treatment of acute respiratory failure with hypoxia.  This is hospital day 5.  Acute respiratory failure with hypoxia due to COVID-19 Unvaccinated. Positive PCR test on 02/13/20, today is day 11 since diagnosis. Weaned to 4 L [place] maintaining saturations >90%. Inflammatory markers downtrending. - s/p 5 d remdesivir (Day 1: 02/19/20) - on day 6/10 of dexamethasone (Day 1: 02/19/20) - currently on 4 L  [place]; wean as able - IS, flutter valve  - antitussives - self prone when able  - Trend inflammatory markers daily  Pulmonary embolism Patient initiated on heparin per pharmacy. Oxygen requirement stable. Continues to deny chest pain.  - Heparin per pharmacy  Hemoccult positive Possible melena Hemoglobin 16.1->15.4->14.1->13.8->14.3->13.6 this admission. Per GI, given patient's COVID-19 pneumonia, hypoxemia, acute PE, in the setting of no overt bleeding and normal hemoglobin, risk of EGD outweighs benefit. Recommend empiric treatment with twice daily PPI, close monitoring of hemoglobin, and  eventual upper endoscopy and colonoscopy once medically appropriate.  - GI consulted, appreciate recs - Protonix 40 mg PO BID since hemoglobin is stable - Eventual EGD/colonoscopy depending on clinical course with COVID-19 pneumonia and acute PE - Monitor closely for signs/symptoms of bleeding - Monitor CBC  AKI Acute urinary retention Microscopic hematuria Creatinine steadily improving, 1.21 this morning from 2.80 on admission. Foley placed yesterday as patient has required 3 instances of I&O cath for PVR >300 cc. - AM BMP - Encourage PO fluids - Flomax 0.4 mg daily - Foley placed 02/23/19 - Patient will need repeat urinalysis in 4 weeks given microscopic hematuria  Acute metabolic encephalopathy Delirium Patient with slight improvement in his attention and thought processing this morning.  - Holding Lyrica as above - Fall precautions - Delirium precautions  Thrombocytopenia 70k this morning, stable from yesterday, down from 87k on admission. - Holding Lyrica - Continue to monitor  New-onset atrial fibrillation, resolved: Cardiac telemetry  Hypertension BP soft on admission, improved after fluids, SBP 130s-140s - Continue to hold lisinopril-HCTZ in setting of normotension and AKI - Flomax for urinary retention as above  Rheumatoid arthritis: Will hold sulfasalazine for now given treatment with decadron   Diet: Heart Healthy IVF: none VTE: Heparin Code: Full PT/OT recs: SNF for Subacute PT  TOC recs: TBD   Please contact the on call pager after 5 pm and on weekends at [phone].  Alexandria Lodge, MD PGY-1 Internal Medicine Teaching Service Pager: [phone] 02/24/2020

## 2020-02-24 NOTE — Progress Notes (Signed)
ANTICOAGULATION CONSULT NOTE - Follow Up Consult  Pharmacy Consult for IV Heparin Indication: PE in setting of possible GIB  Labs: Recent Labs    02/21/20 0728 02/21/20 1040 02/22/20 0058 02/22/20 0910 02/23/20 0123 02/23/20 1811 02/24/20 0110  HGB 14.1  --  13.8  --  14.3  --  13.6  HCT 43.2  --  40.8  --  41.5  --  39.4  PLT 70*  --  71*  --  74*  --  70*  HEPARINUNFRC 0.95*   < > 0.57   < > 0.31 0.44 0.15*  CREATININE 1.64*  --  1.60*  --  1.46*  --   --    < > = values in this interval not displayed.    Assessment: 70 yo male on heparin 900 units/hr for acute bilateral PE.    Heme positive stools were noted and GI was consulted 1/4.  GI noted no overt active bleeding and recommended to monitor,  twice daily PPI  (currently on IV protonix 40 mg q 12 hr) with plan for eventual EGD and colonoscopy (timing TBD, not tomorrow).    Heparin level now down to 0.15 (subtherapeutic). Site had to be changed due to large bruise and swollen so heparin may not have been running in - this was done about 0040.  Goal of Therapy:  Heparin level 0.3-0.5 units/ml   Plan:  Continue heparin heparin infusion at 900 units/hr Will recheck heparin level ~6 hours post site change  Sherlon Handing, PharmD, BCPS Please see amion for complete clinical pharmacist phone list 02/24/2020 3:36 AM

## 2020-02-25 DIAGNOSIS — I2699 Other pulmonary embolism without acute cor pulmonale: Secondary | ICD-10-CM

## 2020-02-25 LAB — CBC WITH DIFFERENTIAL/PLATELET
Abs Immature Granulocytes: 0.03 10*3/uL (ref 0.00–0.07)
Basophils Absolute: 0 10*3/uL (ref 0.0–0.1)
Basophils Relative: 0 %
Eosinophils Absolute: 0.1 10*3/uL (ref 0.0–0.5)
Eosinophils Relative: 1 %
HCT: 40.2 % (ref 39.0–52.0)
Hemoglobin: 13.3 g/dL (ref 13.0–17.0)
Immature Granulocytes: 1 %
Lymphocytes Relative: 19 %
Lymphs Abs: 1.1 10*3/uL (ref 0.7–4.0)
MCH: 33.3 pg (ref 26.0–34.0)
MCHC: 33.1 g/dL (ref 30.0–36.0)
MCV: 100.5 fL — ABNORMAL HIGH (ref 80.0–100.0)
Monocytes Absolute: 0.5 10*3/uL (ref 0.1–1.0)
Monocytes Relative: 9 %
Neutro Abs: 3.9 10*3/uL (ref 1.7–7.7)
Neutrophils Relative %: 70 %
Platelets: 88 10*3/uL — ABNORMAL LOW (ref 150–400)
RBC: 4 MIL/uL — ABNORMAL LOW (ref 4.22–5.81)
RDW: 13.4 % (ref 11.5–15.5)
WBC: 5.6 10*3/uL (ref 4.0–10.5)
nRBC: 0 % (ref 0.0–0.2)

## 2020-02-25 LAB — BASIC METABOLIC PANEL
Anion gap: 12 (ref 5–15)
BUN: 39 mg/dL — ABNORMAL HIGH (ref 8–23)
CO2: 24 mmol/L (ref 22–32)
Calcium: 8.4 mg/dL — ABNORMAL LOW (ref 8.9–10.3)
Chloride: 105 mmol/L (ref 98–111)
Creatinine, Ser: 1.19 mg/dL (ref 0.61–1.24)
GFR, Estimated: >60 mL/min (ref >60)
Glucose, Bld: 90 mg/dL (ref 70–99)
Potassium: 3.9 mmol/L (ref 3.5–5.1)
Sodium: 141 mmol/L (ref 135–145)

## 2020-02-25 LAB — HEPARIN LEVEL (UNFRACTIONATED): Heparin Unfractionated: 0.39 IU/mL (ref 0.30–0.70)

## 2020-02-25 MED ORDER — LIDOCAINE 5 % EX PTCH
1.0000 | MEDICATED_PATCH | CUTANEOUS | Status: DC
  Administered 2020-02-25 – 2020-02-28 (×4): 1 via TRANSDERMAL
  Filled 2020-02-25 (×4): qty 1

## 2020-02-25 NOTE — Progress Notes (Signed)
ANTICOAGULATION CONSULT NOTE - Follow Up Consult  Pharmacy Consult for IV Heparin Indication: PE in setting of possible GIB  Labs: Recent Labs    02/23/20 0123 02/23/20 1811 02/24/20 0110 02/24/20 0813 02/25/20 0351  HGB 14.3  --  13.6  --  13.3  HCT 41.5  --  39.4  --  40.2  PLT 74*  --  70*  --  88*  HEPARINUNFRC 0.31   < > 0.15* 0.43 0.39  CREATININE 1.46*  --  1.21  --  1.19   < > = values in this interval not displayed.    Assessment: 70 yo male on heparin 900 units/hr for acute bilateral PE.    Heme positive stools were noted and GI was consulted 1/4.  GI noted no overt active bleeding and recommended to monitor,  twice daily PPI  (currently on IV protonix 40 mg q 12 hr) with plan for eventual EGD and colonoscopy (timing TBD, not tomorrow).    Heparin level today 0.39 - within target range.  Goal of Therapy:  Heparin level 0.3-0.5 units/ml   Plan:  Continue heparin heparin infusion at 900 units/hr Will recheck heparin level with am labs  Alanda Slim, PharmD, [place] Pharmacist Please see AMION for all Pharmacists' Contact Phone Numbers 02/25/2020, 1:10 PM

## 2020-02-25 NOTE — NC FL2 (Signed)
[place] LEVEL OF CARE SCREENING TOOL     IDENTIFICATION  Patient Name: Douglas [name] Birthdate: 10-25-1950 Sex: male Admission Date (Current Location): 02/19/2020  [facility] and [place] Number:  Herbalist and Address:  [facility] [place], [place] [address], [place], [place] [address]      Provider Number: [id number]  Attending Physician Name and Address:  Lucious Groves, DO  Relative Name and Phone Number:  Hilda Blades [phone]    Current Level of Care: Hospital Recommended Level of Care: [place] Prior Approval Number:    Date Approved/Denied:   PASRR Number: [id number]  Discharge Plan: SNF    Current Diagnoses: Patient Active Problem List   Diagnosis Date Noted  . Acute urinary retention 02/20/2020  . Acute hypoxemic respiratory failure ([place]) 02/20/2020  . Atrial fibrillation with rapid ventricular response ([place]) 02/20/2020  . Heme positive stool 02/20/2020  . Microscopic hematuria 02/20/2020  . Pneumonia due to COVID-19 virus 02/19/2020  . AKI (acute kidney injury) ([place]) 02/19/2020  . Thrombocytopenia ([place]) 02/19/2020  . Rheumatoid arthritis ([place]) 02/19/2020    Orientation RESPIRATION BLADDER Height & Weight     Self,Time,Situation,Place (WDL)  O2 (HFNC 4 liters) Continent (Urethral Catheter Non latex 16 Fri.) Weight: 235 lb 3.7 oz (106.7 kg) Height:  6' (182.9 cm)  BEHAVIORAL SYMPTOMS/MOOD NEUROLOGICAL BOWEL NUTRITION STATUS      Incontinent (Rectal Tube) Diet (See Discharge Summary)  AMBULATORY STATUS COMMUNICATION OF NEEDS Skin   Limited Assist Verbally Other (Comment) (Ecchymosis, arm, left,upper)                       Personal Care Assistance Level of Assistance  Bathing,Feeding,Dressing Bathing Assistance: Limited assistance Feeding assistance: Independent (able to feed self;Cardiac) Dressing Assistance: Limited assistance     Functional Limitations Info  Sight,Hearing,Speech  Sight Info: Impaired Hearing Info: Adequate Speech Info: Adequate    SPECIAL CARE FACTORS FREQUENCY  PT (By licensed PT),OT (By licensed OT)     PT Frequency: 5x min weekly OT Frequency: 5x min weekly            Contractures Contractures Info: Not present    Additional Factors Info  Code Status,Allergies Code Status Info: FULL Allergies Info: Cefazolin,Morphine Sulfate,Penicillins,Hydrochlorothiazide, other (Tide detergent),Oxycodone,Vancomycin           Current Medications (02/25/2020):  This is the current hospital active medication list Current Facility-Administered Medications  Medication Dose Route Frequency Provider Last Rate Last Admin  . brimonidine (ALPHAGAN) 0.2 % ophthalmic solution 1 drop  1 drop Both Eyes BID Jean Rosenthal, MD   1 drop at 02/25/20 0921  . Chlorhexidine Gluconate Cloth 2 % PADS 6 each  6 each Topical Daily Lucious Groves, DO   6 each at 02/24/20 1027  . dexamethasone (DECADRON) tablet 6 mg  6 mg Oral Q24H Jean Rosenthal, MD   6 mg at 02/25/20 0921  . fluticasone (FLONASE) 50 MCG/ACT nasal spray 2 spray  2 spray Each Nare Daily Alexandria Lodge, MD   2 spray at 02/25/20 [phone]  . guaiFENesin-dextromethorphan (ROBITUSSIN DM) 100-10 MG/5ML syrup 10 mL  10 mL Oral Q4H PRN Agyei, Obed K, MD   10 mL at 02/24/20 2225  . heparin ADULT infusion 100 units/mL (25000 units/262mL)  900 Units/hr Intravenous Continuous Lucious Groves, DO 9 mL/hr at 02/25/20 0659 900 Units/hr at 02/25/20 0659  . HYDROcodone-acetaminophen (NORCO) 10-325 MG per tablet 1-2 tablet  1-2 tablet  Oral Q8H PRN Jean Rosenthal, MD   2 tablet at 02/25/20 0746  . latanoprost (XALATAN) 0.005 % ophthalmic solution 1 drop  1 drop Both Eyes QHS Agyei, Obed K, MD   1 drop at 02/24/20 2224  . lidocaine (LIDODERM) 5 % 1 patch  1 patch Transdermal Q24H Alexandria Lodge, MD   1 patch at 02/25/20 1059  . pantoprazole (PROTONIX) EC tablet 40 mg  40 mg Oral BID Vena Rua, PA-C   40 mg at 02/25/20 5465  .  polyethylene glycol (MIRALAX / GLYCOLAX) packet 17 g  17 g Oral Daily PRN Jean Rosenthal, MD   17 g at 02/24/20 2224  . tamsulosin (FLOMAX) capsule 0.4 mg  0.4 mg Oral QPC supper Alexandria Lodge, MD   0.4 mg at 02/24/20 1826  . timolol (TIMOPTIC) 0.5 % ophthalmic solution 1 drop  1 drop Both Eyes Daily Jean Rosenthal, MD   1 drop at 02/25/20 0354     Discharge Medications: Please see discharge summary for a list of discharge medications.  Relevant Imaging Results:  Relevant Lab Results:   Additional Information [phone]  Trula Ore, LCSWA

## 2020-02-25 NOTE — Progress Notes (Signed)
Benefits check submitted for xarelto and eliquis. Coumadin will need dose so we may check the cost. Coumadin is usually a very inexpensive blood thinner. Results wont be back until Monday.

## 2020-02-25 NOTE — TOC Initial Note (Addendum)
Transition of Care [facility]) - Initial/Assessment Note    Patient Details  Name: Douglas [name] MRN: [id number] Date of Birth: 1950-08-22  [facility]) CM/SW Contact:    Trula Ore, [place] Phone Number: 02/25/2020, 10:50 AM  Clinical Narrative:                   CSW received consult for possible SNF placement at time of discharge. CSW spoke with patient by phone but was unable to understand at the moment  Questions asked bye CSW. CSW called patients spouse Hilda Blades regarding PT recommendation of SNF placement at time of discharge. Patients spouse expressed understanding of PT recommendation and is agreeable to SNF placement at time of discharge.Patients spouse would like for CSW to fax out initial referral to SNF that will have COVID SNF beds close to home address. Patient has not received the COVID vaccines.  No further questions reported at this time. CSW to continue to follow and assist with discharge planning needs.  Expected Discharge Plan: Skilled Nursing Facility Barriers to Discharge: Continued Medical Work up   Patient Goals and CMS Choice   CMS Medicare.gov Compare Post Acute Care list provided to:: Patient Represenative (must comment) ([place]ra Spouse) Choice offered to / list presented to : Spouse Hilda Blades)  Expected Discharge Plan and Services Expected Discharge Plan: [place]       Living arrangements for the past 2 months: Single Family Home                                      Prior Living Arrangements/Services Living arrangements for the past 2 months: Single Family Home Lives with:: Self,Spouse Patient language and need for interpreter reviewed:: Yes Do you feel safe going back to the place where you live?: No   SNF  Need for Family Participation in Patient Care: Yes (Comment) Care giver support system in place?: Yes (comment)   Criminal Activity/Legal Involvement Pertinent to Current Situation/Hospitalization: No -  Comment as needed  Activities of Daily Living Home Assistive Devices/Equipment: None ADL Screening (condition at time of admission) Patient's cognitive ability adequate to safely complete daily activities?: Yes Is the patient deaf or have difficulty hearing?: No Does the patient have difficulty seeing, even when wearing glasses/contacts?: No Does the patient have difficulty concentrating, remembering, or making decisions?: No Patient able to express need for assistance with ADLs?: Yes Does the patient have difficulty dressing or bathing?: No Independently performs ADLs?: Yes (appropriate for developmental age) Does the patient have difficulty walking or climbing stairs?: No Weakness of Legs: None Weakness of Arms/Hands: None  Permission Sought/Granted Permission sought to share information with : Case Manager,Family Chief Financial Officer Permission granted to share information with : Yes, Verbal Permission Granted     Permission granted to share info w AGENCY: SNF        Emotional Assessment   Attitude/Demeanor/Rapport: Gracious Affect (typically observed): Calm Orientation: : Oriented to Self Alcohol / Substance Use: Not Applicable Psych Involvement: No (comment)  Admission diagnosis:  Hypoxia [R09.02] Pneumonia due to COVID-19 virus [U07.1, J12.82] COVID-19 [U07.1] Patient Active Problem List   Diagnosis Date Noted  . Acute urinary retention 02/20/2020  . Acute hypoxemic respiratory failure ([place]) 02/20/2020  . Atrial fibrillation with rapid ventricular response ([place]) 02/20/2020  . Heme positive stool 02/20/2020  . Microscopic hematuria 02/20/2020  . Pneumonia due to COVID-19 virus 02/19/2020  .  AKI (acute kidney injury) ([place]) 02/19/2020  . Thrombocytopenia ([place]) 02/19/2020  . Rheumatoid arthritis ([place]) 02/19/2020   PCP:  Tobe Sos, MD Pharmacy:   [facility] [place], [address] AT [place] [address] Phone: [phone] Fax: [phone]     Social Determinants of Health (SDOH) Interventions    Readmission Risk Interventions No flowsheet data found.

## 2020-02-25 NOTE — Progress Notes (Signed)
HD#6 Subjective:   No acute events overnight.   During evaluation at bedside, patient states he is feeling better this morning in terms of his breathing, however he reports he had a poor night's sleep secondary to L hip pain. 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 All questions and concerns addressed at bedside.   Objective:   Vital signs in last 24 hours: Vitals:   02/25/20 0053 02/25/20 0055 02/25/20 0123 02/25/20 0355  BP: (!) 141/78   (!) 144/74  Pulse: 60 64  (!) 57  Resp: 15 16  15   Temp: 97.8 F (36.6 C)   97.6 F (36.4 C)  TempSrc: Axillary   Oral  SpO2: (!) 89% 92% 93% 92%  Weight:      Height:       Supplemental O2: HFNC SpO2: 92 % O2 Flow Rate (L/min): 4 L/min  Physical Exam Constitutional: tired-appearing man sitting up in bed, in no acute distress, moans periodically with repositioning in bed  Cardiovascular: regular rate and rhythm, no m/r/g, no lower extremity edema Pulmonary/Chest: minimal dyspnea with speaking on 4 L [place], exam otherwise stable from prior Abdominal: soft, non-tender, minimally distended MSK/Skin: Tenderness to deep palpation of L hip. Pressure bandage in place; when bandage peeled back, underlying skin is slightly pink however without skin breakdown Neurological/Psych: oriented to person, place, time, and situation, speech and thought process slowly improving, patient still unable to subtract 7 from 100 or say the months of the year backwards  Pertinent Labs: CBC Latest Ref Rng & Units 02/25/2020 02/24/2020 02/23/2020  WBC 4.0 - 10.5 K/uL 5.6 5.4 6.6  Hemoglobin 13.0 - 17.0 g/dL 13.3 13.6 14.3  Hematocrit 39.0 - 52.0 % 40.2 39.4 41.5  Platelets 150 - 400 K/uL 88(L) 70(L) 74(L)    CMP Latest Ref Rng & Units 02/25/2020 02/24/2020 02/23/2020  Glucose 70 - 99 mg/dL 90 107(H) 92  BUN 8 - 23 mg/dL 39(H) 47(H) 63(H)  Creatinine 0.61 - 1.24 mg/dL 1.19 1.21 1.46(H)  Sodium 135 - 145 mmol/L 141 143 143  Potassium 3.5 - 5.1  mmol/L 3.9 4.0 4.3  Chloride 98 - 111 mmol/L 105 107 107  CO2 22 - 32 mmol/L 24 25 25   Calcium 8.9 - 10.3 mg/dL 8.4(L) 8.7(L) 8.8(L)  Total Protein 6.5 - 8.1 g/dL - 5.5(L) 5.9(L)  Total Bilirubin 0.3 - 1.2 mg/dL - 1.2 0.8  Alkaline Phos 38 - 126 U/L - 29(L) 32(L)  AST 15 - 41 U/L - 17 21  ALT 0 - 44 U/L - 15 15    Assessment/Plan:   Principal Problem:   Pneumonia due to COVID-19 virus Active Problems:   AKI (acute kidney injury) (HCC)   Thrombocytopenia (HCC)   Rheumatoid arthritis (HCC)   Acute urinary retention   Acute hypoxemic respiratory failure (HCC)   Atrial fibrillation with rapid ventricular response (HCC)   Heme positive stool   Microscopic hematuria   Patient Summary: Douglas [name] is a 70 y.o. man with with a pertinent PMH of rheumatoid arthritis, hypertension, osteoarthritis, who presented with shortness of breath after recent diagnosis of COVID-19 via PCR on 02/13/20 and was admitted for further evaluation and treatment of acute respiratory failure with hypoxia.  This is hospital day 6.  Acute Respiratory Failure with Hypoxia Due to COVID-19 Unvaccinated. Positive PCR test on 02/13/20, today is day 12 since diagnosis. Stable on 4 L [place] maintaining saturations >90%. Inflammatory markers downtrending.  - Completed remdesivir (Day 1:  02/19/20) - Day 7/10 of dexamethasone (Day 1: 02/19/20) - currently on 4 L [place]; wean as able - IS, flutter valve  - antitussives - self prone when able  - Trend inflammatory markers daily  Pulmonary embolism in the setting of COVID-19 - Heparin per pharmacy  Acute metabolic encephalopathy Delirium Patient with continued improvement in his mentation and processing this AM.  - Holding Lyrica as above - Fall precautions - Delirium precautions  Hemoccult positive Possible melena Hemoglobin 16.1->15.4->14.1->13.8->14.3->13.6>13.3 this admission. Per GI, given patient's COVID-19 pneumonia, hypoxemia, acute PE, in the setting of  no overt bleeding and normal hemoglobin, risk of EGD outweighs benefit. Recommend empiric treatment with twice daily PPI, close monitoring of hemoglobin, and eventual upper endoscopy and colonoscopy once medically appropriate.  - GI consulted, appreciate recs - Protonix 40 mg PO BID since hemoglobin is stable - Eventual EGD/colonoscopy depending on clinical course with COVID-19 pneumonia and acute PE - Monitor closely for signs/symptoms of bleeding - Monitor CBC  AKI Acute urinary retention Microscopic hematuria Creatinine steadily improving, 1.19<1.21. - Trend BMP - Encourage PO fluids - Flomax 0.4 mg daily - Foley placed 02/23/19 - Patient will need repeat urinalysis in 4 weeks given microscopic hematuria  Thrombocytopenia 70k this morning, stable from yesterday, down from 87k on admission. - Holding Lyrica - Continue to monitor  New-onset atrial fibrillation, resolved: Cardiac telemetry  Hypertension BP soft on admission, improved after fluids, SBP 130s-140s - Continue to hold lisinopril-HCTZ in setting of normotension and AKI - Flomax for urinary retention as above  Rheumatoid arthritis: Will hold sulfasalazine for now given treatment with decadron   Diet: Heart Healthy IVF: none VTE: Heparin Code: Full PT/OT recs: SNF for Subacute PT  TOC recs: TBD   Please contact the on call pager after 5 pm and on weekends at [phone].  Alexandria Lodge, MD PGY-1 Internal Medicine Teaching Service Pager: [phone] 02/25/2020

## 2020-02-26 LAB — CBC WITH DIFFERENTIAL/PLATELET
Abs Immature Granulocytes: 0.05 10*3/uL (ref 0.00–0.07)
Basophils Absolute: 0 10*3/uL (ref 0.0–0.1)
Basophils Relative: 0 %
Eosinophils Absolute: 0.1 10*3/uL (ref 0.0–0.5)
Eosinophils Relative: 1 %
HCT: 38.3 % — ABNORMAL LOW (ref 39.0–52.0)
Hemoglobin: 13.2 g/dL (ref 13.0–17.0)
Immature Granulocytes: 1 %
Lymphocytes Relative: 17 %
Lymphs Abs: 0.9 10*3/uL (ref 0.7–4.0)
MCH: 34.2 pg — ABNORMAL HIGH (ref 26.0–34.0)
MCHC: 34.5 g/dL (ref 30.0–36.0)
MCV: 99.2 fL (ref 80.0–100.0)
Monocytes Absolute: 0.5 10*3/uL (ref 0.1–1.0)
Monocytes Relative: 9 %
Neutro Abs: 4.1 10*3/uL (ref 1.7–7.7)
Neutrophils Relative %: 72 %
Platelets: 110 10*3/uL — ABNORMAL LOW (ref 150–400)
RBC: 3.86 MIL/uL — ABNORMAL LOW (ref 4.22–5.81)
RDW: 13.4 % (ref 11.5–15.5)
WBC: 5.7 10*3/uL (ref 4.0–10.5)
nRBC: 0 % (ref 0.0–0.2)

## 2020-02-26 LAB — BASIC METABOLIC PANEL
Anion gap: 10 (ref 5–15)
BUN: 29 mg/dL — ABNORMAL HIGH (ref 8–23)
CO2: 25 mmol/L (ref 22–32)
Calcium: 8.4 mg/dL — ABNORMAL LOW (ref 8.9–10.3)
Chloride: 104 mmol/L (ref 98–111)
Creatinine, Ser: 1.13 mg/dL (ref 0.61–1.24)
GFR, Estimated: >60 mL/min (ref >60)
Glucose, Bld: 89 mg/dL (ref 70–99)
Potassium: 3.9 mmol/L (ref 3.5–5.1)
Sodium: 139 mmol/L (ref 135–145)

## 2020-02-26 LAB — HEPARIN LEVEL (UNFRACTIONATED): Heparin Unfractionated: 0.29 IU/mL — ABNORMAL LOW (ref 0.30–0.70)

## 2020-02-26 MED ORDER — HYDROCODONE-ACETAMINOPHEN 10-325 MG PO TABS
1.0000 | ORAL_TABLET | Freq: Four times a day (QID) | ORAL | Status: DC | PRN
  Administered 2020-02-26 – 2020-02-27 (×3): 2 via ORAL
  Filled 2020-02-26 (×3): qty 2

## 2020-02-26 NOTE — Progress Notes (Addendum)
Physical Therapy Treatment Patient Details Name: Douglas [name] MRN: [id number] DOB: [birth year] Today's Date: 02/26/2020    History of Present Illness Pt is a 70 y.o. male who tested (+) COVID-19 on 02/13/20, now admitted 02/19/20 with SOB. Workup for acute respiratory failure with hypoxia due to COVID-19. VQ scan 1/4 wtih PE, heparin initiated. Concern for GIB; plan for conservative management. PMH includes RA, HTN, OA.   PT Comments    Pt slowly progressing with mobility. Today's session focused on LE strengthening and transfer training with RW; pt requiring min-modA for standing and stability, able to take steps this session, although limited by c/o chronic L hip pain (RN aware). Pt A&Ox4, but remains limited by poor attention, impaired problem solving and decreased awareness. Continue to recommend SNF-level therapies to maximize functional mobility and independence prior to return home.   Follow Up Recommendations  SNF;Supervision/Assistance - 24 hour     Equipment Recommendations   (TBD)    Recommendations for Other Services       Precautions / Restrictions Precautions Precautions: Fall Precaution Comments: Flexiseal Restrictions Weight Bearing Restrictions: No    Mobility  Bed Mobility Overal bed mobility: Needs Assistance Bed Mobility: Supine to Sit     Supine to sit: Supervision;HOB elevated        Transfers Overall transfer level: Needs assistance Equipment used: Rolling walker (2 wheeled) Transfers: Sit to/from Stand Sit to Stand: Mod assist         General transfer comment: Max, repeated cues for hand placement and sequencing; required multiple attempts to stand as pt limited by c/o L hip pain; able to stand with minimal WB through LLE, modA for trunk elevation and stability. Improved ability to achieve trunk extension with less forward lean this session  Ambulation/Gait Ambulation/Gait assistance: Min assist;Mod assist Gait Distance (Feet): 4  Feet Assistive device: Rolling walker (2 wheeled) Gait Pattern/deviations: Step-to pattern;Decreased weight shift to left;Trunk flexed;Antalgic Gait velocity: Decreased   General Gait Details: Able to take complete steps with RW and min-modA for stability and RW navigation; decreased WB through LLE with c/o chronichip pain; heavy reliance on RW support; declined further distance due to pain/fatigue   Stairs             Wheelchair Mobility    Modified Rankin (Stroke Patients Only)       Balance Overall balance assessment: Needs assistance   Sitting balance-Leahy Scale: Fair Sitting balance - Comments: Scooting self to EOB well     Standing balance-Leahy Scale: Poor Standing balance comment: reliant on UE support and external assist                            Cognition Arousal/Alertness: Awake/alert Behavior During Therapy: Flat affect Overall Cognitive Status: Impaired/Different from baseline Area of Impairment: Attention;Memory;Following commands;Safety/judgement;Awareness;Problem solving                   Current Attention Level: Sustained Memory: Decreased short-term memory Following Commands: Follows one step commands with increased time;Follows one step commands inconsistently Safety/Judgement: Decreased awareness of deficits;Decreased awareness of safety Awareness: Intellectual Problem Solving: Slow processing;Decreased initiation;Difficulty sequencing;Requires verbal cues;Requires tactile cues General Comments: Very quick to lose attention (self-distracts), requiring simple, one-step commands, and only able to answer one question at a time; overall, attention still improved compared to last week's session      Exercises General Exercises - Lower Extremity Long Arc Quad: AROM;Both;Seated Hip Flexion/Marching: AROM;Both;Seated    General  Comments General comments (skin integrity, edema, etc.): Pt stating son also admitted and son was able  to visit with him yesterday. SpO2 93% on RA, minimal DOE noted      Pertinent Vitals/Pain Pain Assessment: Faces Faces Pain Scale: Hurts even more Pain Location: L hip > back Pain Descriptors / Indicators: Grimacing;Guarding;Aching Pain Intervention(s): Monitored during session;Limited activity within patient's tolerance;Patient requesting pain meds-RN notified    Home Living                      Prior Function            PT Goals (current goals can now be found in the care plan section) Progress towards PT goals: Progressing toward goals    Frequency    Min 2X/week      PT Plan Frequency needs to be updated    Co-evaluation              AM-PAC PT "6 Clicks" Mobility   Outcome Measure  Help needed turning from your back to your side while in a flat bed without using bedrails?: A Little Help needed moving from lying on your back to sitting on the side of a flat bed without using bedrails?: A Little Help needed moving to and from a bed to a chair (including a wheelchair)?: A Lot Help needed standing up from a chair using your arms (e.g., wheelchair or bedside chair)?: A Lot Help needed to walk in hospital room?: A Lot Help needed climbing 3-5 steps with a railing? : A Lot 6 Click Score: 14    End of Session Equipment Utilized During Treatment: Gait belt Activity Tolerance: Patient tolerated treatment well;Patient limited by pain Patient left: in chair;with call bell/phone within reach;with chair alarm set Nurse Communication: Mobility status;Patient requests pain meds PT Visit Diagnosis: Other abnormalities of gait and mobility (R26.89);Muscle weakness (generalized) (M62.81)     Time: 6606-3016 PT Time Calculation (min) (ACUTE ONLY): 24 min  Charges:  $Therapeutic Exercise: 8-22 mins $Therapeutic Activity: 8-22 mins                     Mabeline Caras, PT, DPT Acute Rehabilitation Services  Pager [phone] Office [place] 02/26/2020, 10:39 AM

## 2020-02-26 NOTE — Progress Notes (Signed)
HD#7 Subjective:   No acute events overnight. Patient has been on room air maintaining saturations >93% since early this morning.  During evaluation at bedside, the patient states he is feeling better in terms of his breathing, however he reports ongoing L hip pain despite Dilaudid PRN. His RN at bedside reports the patient just worked with PT who helped get him in his chair and has been requesting additional pain medication.  Objective:   Vital signs in last 24 hours: Vitals:   02/25/20 1700 02/25/20 2032 02/25/20 2353 02/26/20 0514  BP: 139/87 132/66 139/75 123/72  Pulse: 70 62 61 60  Resp: 18 (!) 24 15 14   Temp: 98.2 F (36.8 C) 98.5 F (36.9 C) 98.3 F (36.8 C) 98.4 F (36.9 C)  TempSrc: Oral Axillary Axillary Axillary  SpO2: 97% 92% 93% 92%  Weight:      Height:       Supplemental O2: None  Physical Exam Constitutional: tired-appearing man sitting up in bedside chair, in no acute distress, moans periodically while shifting in chair Cardiovascular: regular rate and rhythm, no m/r/g, no lower extremity edema Pulmonary/Chest: breathing comfortably on room air Neurological/Psych: oriented to person, place, time, and situation, speech and thought process slowly improving  Pertinent Labs: CBC Latest Ref Rng & Units 02/26/2020 02/25/2020 02/24/2020  WBC 4.0 - 10.5 K/uL 5.7 5.6 5.4  Hemoglobin 13.0 - 17.0 g/dL 13.2 13.3 13.6  Hematocrit 39.0 - 52.0 % 38.3(L) 40.2 39.4  Platelets 150 - 400 K/uL 110(L) 88(L) 70(L)    CMP Latest Ref Rng & Units 02/26/2020 02/25/2020 02/24/2020  Glucose 70 - 99 mg/dL 89 90 107(H)  BUN 8 - 23 mg/dL 29(H) 39(H) 47(H)  Creatinine 0.61 - 1.24 mg/dL 1.13 1.19 1.21  Sodium 135 - 145 mmol/L 139 141 143  Potassium 3.5 - 5.1 mmol/L 3.9 3.9 4.0  Chloride 98 - 111 mmol/L 104 105 107  CO2 22 - 32 mmol/L 25 24 25   Calcium 8.9 - 10.3 mg/dL 8.4(L) 8.4(L) 8.7(L)  Total Protein 6.5 - 8.1 g/dL - - 5.5(L)  Total Bilirubin 0.3 - 1.2 mg/dL - - 1.2  Alkaline Phos  38 - 126 U/L - - 29(L)  AST 15 - 41 U/L - - 17  ALT 0 - 44 U/L - - 15    Assessment/Plan:   Principal Problem:   Pneumonia due to COVID-19 virus Active Problems:   AKI (acute kidney injury) (HCC)   Thrombocytopenia (HCC)   Rheumatoid arthritis (HCC)   Acute urinary retention   Acute hypoxemic respiratory failure (HCC)   Atrial fibrillation with rapid ventricular response (HCC)   Heme positive stool   Microscopic hematuria   Acute pulmonary embolism ([place])   Patient Summary: Douglas [name] is a 70 y.o. man with with a pertinent PMH of rheumatoid arthritis, hypertension, osteoarthritis, who presented with shortness of breath after recent diagnosis of COVID-19 via PCR on 02/13/20 and was admitted for further evaluation and treatment of acute respiratory failure with hypoxia.  This is hospital day 7.  Acute Respiratory Failure with Hypoxia Due to COVID-19 Unvaccinated. Positive PCR test on 02/13/20, today is day 13 since diagnosis. Weaned to room air overnight maintaining saturations >90%. - Completed 5 d remdesivir  - Day 8/10 of dexamethasone (Day 1: 02/19/20) - currently on room air - ambulate with pulse ox - IS, flutter valve  - antitussives  Pulmonary embolism in the setting of COVID-19 - Heparin per pharmacy - Will work to switch to NOAC  depending on whether GI plans to do upper endoscopy and colonoscopy inpatient vs outpatient  Hemoccult positive Possible melena Hemoglobin 16.1->15.4->14.1->13.8->14.3->13.6>13.3->13.2 this admission.  - GI consulted, appreciate recs - Protonix 40 mg PO BID since hemoglobin is stable - Eventual EGD/colonoscopy depending on clinical course with COVID-19 pneumonia and acute PE - Monitor closely for signs/symptoms of bleeding - Monitor CBC  Acute metabolic encephalopathy Delirium Patient with continued improvement in his mentation and processing this AM.  - Holding Lyrica as above - Fall precautions - Delirium  precautions  Left hip pain Patient with history of L hip replacement. Reports ongoing pain despite Norco PRN. - Increase frequency of hydrocodone-acetaminophen 10-325 mg from q6h PRN to q8h  AKI Acute urinary retention Microscopic hematuria Creatinine steadily improving, 1.13<1.21. - AM BMP - Encourage PO fluids - Flomax 0.4 mg daily - Foley placed 02/23/19 - Trial of void prior to discharge - Patient will need repeat urinalysis in 4 weeks given microscopic hematuria  Thrombocytopenia Increased to 110k this morning (87k on admission). - Holding Lyrica - Continue to monitor  New-onset atrial fibrillation, resolved: Cardiac telemetry  Hypertension - Continue to hold lisinopril-HCTZ in setting of normotension and AKI - Flomax for urinary retention as above  Rheumatoid arthritis: Will hold sulfasalazine for now given treatment with decadron   Diet: Heart Healthy IVF: none VTE: Heparin Code: Full PT/OT recs: SNF for Subacute PT  TOC recs: Initial referral to SNF with COVID SNFs beds placed   Please contact the on call pager after 5 pm and on weekends at [phone].  [place]andria Lodge, MD PGY-1 Internal Medicine Teaching Service Pager: [phone] 02/26/2020

## 2020-02-26 NOTE — TOC Benefit Eligibility Note (Addendum)
Transition of Care [facility]) Benefit Eligibility Note    Patient Details  Name: Douglas [name] MRN: [id number] Date of Birth: 07/25/1950   MEDICATION  / DOSE : ELIQUIS  5 MD BID- CO-PAY- $37.00 and XARELTO 20 MG  DAILY- CO-PAY- $37.00     Tier: 3 Drug  Prescription Coverage Preferred Pharmacy: [facility] with Person/Company/Phone Number:: PEGGY  @   [employer] # [phone]  Co-Pay: 437.00  Prior Approval: No  Deductible: Met       [facility] [place] Phone Number: 02/26/2020, 1:40 PM

## 2020-02-26 NOTE — Plan of Care (Signed)
VSS. Remains on room air. C/o chronic pain, given norco PRN. Repositioned for comfort. Not OOB. Call bell within reach. Bed alarm on.   Problem: Education: Goal: Knowledge of risk factors and measures for prevention of condition will improve Outcome: Progressing   Problem: Coping: Goal: Psychosocial and spiritual needs will be supported Outcome: Progressing   Problem: Respiratory: Goal: Will maintain a patent airway Outcome: Progressing Goal: Complications related to the disease process, condition or treatment will be avoided or minimized Outcome: Progressing

## 2020-02-26 NOTE — Progress Notes (Signed)
ANTICOAGULATION CONSULT NOTE - Follow Up Consult  Pharmacy Consult for Heparin Indication: pulmonary embolus  Allergies  Allergen Reactions  . Cefazolin Rash    See 08/2012 admission   . Morphine Sulfate   . Penicillins Hives  . Hydrochlorothiazide Rash  . Other Rash    Tide detergent  . Oxycodone Rash  . Vancomycin Rash    Drug rash     Patient Measurements: Height: 6' (182.9 cm) Weight: 106.7 kg (235 lb 3.7 oz) IBW/kg (Calculated) : 77.6 Heparin Dosing Weight: 100 kg  Vital Signs: Temp: 98 F (36.7 C) (01/10 0745) Temp Source: Oral (01/10 0745) BP: 127/78 (01/10 0745) Pulse Rate: 64 (01/10 0745)  Labs: Recent Labs    02/24/20 0110 02/24/20 0813 02/25/20 0351 02/26/20 0157  HGB 13.6  --  13.3 13.2  HCT 39.4  --  40.2 38.3*  PLT 70*  --  88* 110*  HEPARINUNFRC 0.15* 0.43 0.39 0.29*  CREATININE 1.21  --  1.19 1.13    Estimated Creatinine Clearance: 77.8 mL/min (by C-G formula based on SCr of 1.13 mg/dL).  Assessment: 70 year old male admitted 02/19/20 for worsening COVID-19 infection with elevated D-dimer. Bilateral PE per VQ scan 02/20/19. No anticoagulation prior to admission.    Heme positive stools were noted and GI consulted 1/4.  GI noted no overt active bleeding and recommended to monitor, give twice daily PPI (changed IV > PO on 02/23/20), with plan for eventual EGD and colonoscopy.    Heparin level 0.29 on 900 units/hr.  Just below low therapeutic goal 0.3-0.5, but has been therapeutic on this rate the last few days. Thrombocytopenia, platelet count trended up to 110K. No bleeding reported.  Goal of Therapy:  Heparin level 0.3-0.5 units/ml Monitor platelets by anticoagulation protocol: Yes   Plan:   Continue heparin drip at 900 units/hr.  Daily heparin level and CBC.  Monitor for bleeding.  Follow up oral anticoagulation plans.  Arty Baumgartner, RPh 02/26/2020,8:38 AM

## 2020-02-26 NOTE — Progress Notes (Addendum)
Progress Note  Chief Complaint:    Heme positive stools.      ASSESSMENT / PLAN:    # 70 yo male admitted with AKI,  hypoxemia , COVID19 PNA and acute PE.  Received remdesivier / dexamethasone. On room air. Getting IV heparin for PE  # Heme positive, dark but not black stools. Also had positive FIT in 2020 but he and PCP were taking a "watch and see approach".  Given COVID PNA and acute PE we feel like risk of endoscopic evaluation outweighs the benefit. His hgb is stable at 13.6.  --GI will sign off. Will arrange for office follow up with me on  February 24 at 10:30 --Continue BID PPI at discharge. Continue until seen in office.     # Macrocytosis without anemia. B12, folate normal.  Sulfasalazine on home med which I assume he takes for history of RA. Macrocytosis can be associated with Sulfasalazine.    SUBJECTIVE:   No complaints. No overt GI bleeding    OBJECTIVE:    Scheduled inpatient medications:  . brimonidine  1 drop Both Eyes BID  . Chlorhexidine Gluconate Cloth  6 each Topical Daily  . dexamethasone  6 mg Oral Q24H  . fluticasone  2 spray Each Nare Daily  . latanoprost  1 drop Both Eyes QHS  . lidocaine  1 patch Transdermal Q24H  . pantoprazole  40 mg Oral BID  . tamsulosin  0.4 mg Oral QPC supper  . timolol  1 drop Both Eyes Daily   Continuous inpatient infusions:  . heparin 900 Units/hr (02/26/20 0624)   PRN inpatient medications: guaiFENesin-dextromethorphan, HYDROcodone-acetaminophen, polyethylene glycol  Vital signs in last 24 hours: Temp:  [97.9 F (36.6 C)-98.5 F (36.9 C)] 97.9 F (36.6 C) (01/10 1222) Pulse Rate:  [60-75] 75 (01/10 1222) Resp:  [14-24] 21 (01/10 1222) BP: (119-139)/(66-87) 119/77 (01/10 1222) SpO2:  [92 %-97 %] 97 % (01/10 1222) Last BM Date: 02/26/20  Intake/Output Summary (Last 24 hours) at 02/26/2020 1538 Last data filed at 02/26/2020 1500 Gross per 24 hour  Intake --  Output 600 ml  Net -600 ml     Physical  Exam:  . General: Alert male in NAD . Heart:  Regular rate and rhythm. No lower extremity edema . Pulmonary: Normal respiratory effort . Abdomen: Soft, nondistended, nontender. Normal bowel sounds.  . Neurologic: Alert . Slow to answer questions but answers appropriately.  Marland Kitchen Psych: Pleasant. Cooperative.   Filed Weights   02/21/20 0300  Weight: 106.7 kg    Intake/Output from previous day: No intake/output data recorded. Intake/Output this shift: Total I/O In: -  Out: 600 [Urine:600]    Lab Results: Recent Labs    02/24/20 0110 02/25/20 0351 02/26/20 0157  WBC 5.4 5.6 5.7  HGB 13.6 13.3 13.2  HCT 39.4 40.2 38.3*  PLT 70* 88* 110*   BMET Recent Labs    02/24/20 0110 02/25/20 0351 02/26/20 0157  NA 143 141 139  K 4.0 3.9 3.9  CL 107 105 104  CO2 25 24 25   GLUCOSE 107* 90 89  BUN 47* 39* 29*  CREATININE 1.21 1.19 1.13  CALCIUM 8.7* 8.4* 8.4*   LFT Recent Labs    02/24/20 0110  PROT 5.5*  ALBUMIN 2.9*  AST 17  ALT 15  ALKPHOS 29*  BILITOT 1.2      LOS: 7 days   Tye Savoy ,NP 02/26/2020, 3:38 PM    ________________________________________________________________________  Velora Heckler GI MD note:  I personally examined the patient, reviewed the data and agree with the assessment and plan described above.  He's not having any overt Gi bleeding, even despite anticoagulants. We'll follow up with him as an outpatient after further covid recovery  Please call or page with any further questions or concerns.    Owens Loffler, MD [facility] Gastroenterology Pager [phone]

## 2020-02-26 NOTE — TOC Progression Note (Signed)
Transition of Care [facility]) - Progression Note    Patient Details  Name: Douglas [name] MRN: [id number] Date of Birth: April 14, 1950  Transition of Care [facility]) CM/SW [place], LCSW Phone Number: 02/26/2020, 4:18 PM  Clinical Narrative:    CSW expanded SNF search as patient will be 14 days past his first COVID diagnosis tomorrow.    Expected Discharge Plan: [place] Barriers to Discharge: Continued Medical Work up  Expected Discharge Plan and Services Expected Discharge Plan: [place] arrangements for the past 2 months: Single Family Home                                       Social Determinants of Health (SDOH) Interventions    Readmission Risk Interventions No flowsheet data found.

## 2020-02-27 LAB — BASIC METABOLIC PANEL
Anion gap: 11 (ref 5–15)
BUN: 30 mg/dL — ABNORMAL HIGH (ref 8–23)
CO2: 23 mmol/L (ref 22–32)
Calcium: 8.5 mg/dL — ABNORMAL LOW (ref 8.9–10.3)
Chloride: 101 mmol/L (ref 98–111)
Creatinine, Ser: 1.12 mg/dL (ref 0.61–1.24)
GFR, Estimated: >60 mL/min (ref >60)
Glucose, Bld: 113 mg/dL — ABNORMAL HIGH (ref 70–99)
Potassium: 4.9 mmol/L (ref 3.5–5.1)
Sodium: 135 mmol/L (ref 135–145)

## 2020-02-27 LAB — CBC WITH DIFFERENTIAL/PLATELET
Abs Immature Granulocytes: 0.04 10*3/uL (ref 0.00–0.07)
Basophils Absolute: 0 10*3/uL (ref 0.0–0.1)
Basophils Relative: 0 %
Eosinophils Absolute: 0.1 10*3/uL (ref 0.0–0.5)
Eosinophils Relative: 1 %
HCT: 39 % (ref 39.0–52.0)
Hemoglobin: 13.1 g/dL (ref 13.0–17.0)
Immature Granulocytes: 1 %
Lymphocytes Relative: 19 %
Lymphs Abs: 1.1 10*3/uL (ref 0.7–4.0)
MCH: 33.9 pg (ref 26.0–34.0)
MCHC: 33.6 g/dL (ref 30.0–36.0)
MCV: 100.8 fL — ABNORMAL HIGH (ref 80.0–100.0)
Monocytes Absolute: 0.6 10*3/uL (ref 0.1–1.0)
Monocytes Relative: 11 %
Neutro Abs: 3.8 10*3/uL (ref 1.7–7.7)
Neutrophils Relative %: 68 %
Platelets: 123 10*3/uL — ABNORMAL LOW (ref 150–400)
RBC: 3.87 MIL/uL — ABNORMAL LOW (ref 4.22–5.81)
RDW: 13.2 % (ref 11.5–15.5)
WBC: 5.6 10*3/uL (ref 4.0–10.5)
nRBC: 0 % (ref 0.0–0.2)

## 2020-02-27 LAB — HEPARIN LEVEL (UNFRACTIONATED)
Heparin Unfractionated: 0.13 IU/mL — ABNORMAL LOW (ref 0.30–0.70)
Heparin Unfractionated: 0.16 IU/mL — ABNORMAL LOW (ref 0.30–0.70)

## 2020-02-27 MED ORDER — PREGABALIN 75 MG PO CAPS
150.0000 mg | ORAL_CAPSULE | Freq: Three times a day (TID) | ORAL | Status: DC
  Administered 2020-02-27 – 2020-02-28 (×4): 150 mg via ORAL
  Filled 2020-02-27 (×4): qty 2

## 2020-02-27 MED ORDER — HYDROCODONE-ACETAMINOPHEN 10-325 MG PO TABS
1.0000 | ORAL_TABLET | ORAL | Status: DC | PRN
  Administered 2020-02-27 – 2020-02-28 (×4): 1 via ORAL
  Filled 2020-02-27 (×4): qty 1

## 2020-02-27 MED ORDER — RIVAROXABAN 20 MG PO TABS: 20.0000 mg | ORAL_TABLET | Freq: Every day | ORAL | Status: DC

## 2020-02-27 MED ORDER — RIVAROXABAN 15 MG PO TABS
15.0000 mg | ORAL_TABLET | Freq: Two times a day (BID) | ORAL | Status: DC
  Administered 2020-02-27 – 2020-02-28 (×4): 15 mg via ORAL
  Filled 2020-02-27 (×4): qty 1

## 2020-02-27 NOTE — Progress Notes (Signed)
PRN pain medication given for chronic hip pain, see MAR, pt repeatedly verbalized that his back/hip was hurting, offered pt heat/cold pad for hip/back and pt just kept repeating "I dont know / I dont care" when asked if he wanted the heating / cooling pad, offered to reposition pt, pt refused, asked if pt would like to get into chair or walk, pt refused. No other needs at this time, will CTM.

## 2020-02-27 NOTE — Progress Notes (Signed)
ANTICOAGULATION CONSULT NOTE - Follow Up Consult  Pharmacy Consult for Heparin > Xarelto Indication: pulmonary embolus  Allergies  Allergen Reactions  . Cefazolin Rash    See 08/2012 admission   . Morphine Sulfate   . Penicillins Hives  . Hydrochlorothiazide Rash  . Other Rash    Tide detergent  . Oxycodone Rash  . Vancomycin Rash    Drug rash     Patient Measurements: Height: 6' (182.9 cm) Weight: 106.7 kg (235 lb 3.7 oz) IBW/kg (Calculated) : 77.6 Heparin Dosing Weight: 100 kg  Vital Signs: Temp: 97.9 F (36.6 C) (01/11 1220) Temp Source: Axillary (01/11 0358) BP: 127/81 (01/11 1220) Pulse Rate: 67 (01/11 1220)  Labs: Recent Labs    02/25/20 0351 02/26/20 0157 02/27/20 0222 02/27/20 1031  HGB 13.3 13.2 13.1  --   HCT 40.2 38.3* 39.0  --   PLT 88* 110* 123*  --   HEPARINUNFRC 0.39 0.29* 0.13* 0.16*  CREATININE 1.19 1.13  --  1.12    Estimated Creatinine Clearance: 78.5 mL/min (by C-G formula based on SCr of 1.12 mg/dL).  Assessment: 70 year old male admitted 02/19/20 for worsening COVID-19 infection with elevated D-dimer. Bilateral PE per VQ scan 02/20/19. No anticoagulation prior to admission.    Heme positive stools were noted and GI consulted 1/4.  GI noted no overt active bleeding and recommended to monitor, give twice daily PPI (changed IV > PO on 02/23/20), with plan for eventual EGD and colonoscopy as outpatient.   Heparin level 0.19  on 1100 units/hr. Had been therapeutic on 900 units/hr then low today. RN reports IV alarming intermittently, so possible infusion issues, but now to change to Xarelto. Thrombocytopenia, platelet count trended up to 1123K. No bleeding reported.  Goal of Therapy:  Heparin level 0.3-0.5 units/ml appropriate Xarelto dose for indication Monitor platelets by anticoagulation protocol: Yes   Plan:  Begin Xarelto 15 mg BID x 21 days then 20 mg daily with supper. Heparin drip to stop when giving first Xarelto dose. Monitor for  bleeding.  Arty Baumgartner, RPh 02/27/2020,12:25 PM

## 2020-02-27 NOTE — Plan of Care (Signed)
  Problem: Education: Goal: Knowledge of risk factors and measures for prevention of condition will improve Outcome: Progressing   Problem: Coping: Goal: Psychosocial and spiritual needs will be supported Outcome: Progressing   Problem: Respiratory: Goal: Will maintain a patent airway Outcome: Progressing   

## 2020-02-27 NOTE — Discharge Instructions (Addendum)
Douglas [name],   It was a pleasure taking care of you.   - COVID-19 pneumonia: You were admitted to the hospital and treated for COVID-19 pneumonia. You were treated for COVID with remdesivir, steroids, and supplemental oxygen. - Pulmonary embolism: You were also found to have a pulmonary embolism (blood clot in your lungs)  You were treated for your pulmonary embolism first with intravenous heparin and then switched to rivaroxaban (Xarelto). For Xarelto, you will take 15 mg twice daily for 21 days followed by 20 mg daily for at least 3 months.  - Urinary retention: A Foley catheter was placed during this hospitalization because you were unable to urinate. You were started on a medication, Flomax, to help with this. While you are at the skilled nursing facility for rehab, the catheter may be able to be removed for a trial of voiding.  - Blood pressure: we held your lisinopril-hydrochlorothiazide during this hospitalization because your blood pressure was normal. Recommend continue to hold until your blood pressure becomes elevated. - GI bleeding: While you were hospitalized, you were noted to have dark stools and a drop in your hemoglobin concerning for a bleed in your GI tract. You are being treated with antacid medication. You will continue this pantoprazole 40 mg twice daily until you are able to follow up with them.   We would like for you to follow-up with your primary care physician in about 1 week to have some repeat bloodwork.  Take care!   ======================================================================================================= Information on my medicine - XARELTO (rivaroxaban)  This medication education was reviewed with me or my healthcare representative as part of my discharge preparation.    WHY WAS XARELTO PRESCRIBED FOR YOU? Xarelto was prescribed to treat blood clots that may have been found in the veins of your legs (deep vein thrombosis) or in your lungs  (pulmonary embolism) and to reduce the risk of them occurring again.  What do you need to know about Xarelto? The starting dose is one 15 mg tablet taken TWICE daily with food for the FIRST 21 DAYS then on 03/19/20 pm,  the dose is changed to one 20 mg tablet taken ONCE A DAY with your evening meal.  DO NOT stop taking Xarelto without talking to the health care provider who prescribed the medication.  Refill your prescription for 20 mg tablets before you run out.  After discharge, you should have regular check-up appointments with your healthcare provider that is prescribing your Xarelto.  In the future your dose may need to be changed if your kidney function changes by a significant amount.  What do you do if you miss a dose? If you are taking Xarelto TWICE DAILY and you miss a dose, take it as soon as you remember. You may take two 15 mg tablets (total 30 mg) at the same time then resume your regularly scheduled 15 mg twice daily the next day.  If you are taking Xarelto ONCE DAILY and you miss a dose, take it as soon as you remember on the same day then continue your regularly scheduled once daily regimen the next day. Do not take two doses of Xarelto at the same time.   Important Safety Information Xarelto is a blood thinner medicine that can cause bleeding. You should call your healthcare provider right away if you experience any of the following: ? Bleeding from an injury or your nose that does not stop. ? Unusual colored urine (red or dark brown) or unusual colored stools (red  or black). ? Unusual bruising for unknown reasons. ? A serious fall or if you hit your head (even if there is no bleeding).  Some medicines may interact with Xarelto and might increase your risk of bleeding while on Xarelto. To help avoid this, consult your healthcare provider or pharmacist prior to using any new prescription or non-prescription medications, including herbals, vitamins, non-steroidal  anti-inflammatory drugs (NSAIDs) and supplements.  This website has more information on Xarelto: [email or web address]-[email or web address].[email or web address]

## 2020-02-27 NOTE — Progress Notes (Signed)
HD#8 Subjective:   No acute events overnight.  During evaluation at bedside, the patient states he continues to feel "pretty good" in terms of his breathing. His concern now, however, is his persistent L hip pain. He is able to tell us today that he follows with a pain clinic, [facility], and that he is prescribed hydrocodone-acetaminophen 10-325 q4h PRN and pregabalin 150 mg three times daily. Discussed plan for switching heparin to Xarelto and waiting for SNF bed placement.  Objective:   Vital signs in last 24 hours: Vitals:   02/26/20 1950 02/26/20 2346 02/27/20 0358 02/27/20 0725  BP: (!) 149/80 (!) 145/84 (!) 143/84 130/83  Pulse: 61 74 64 68  Resp: 20 15 16 15   Temp: 98.3 F (36.8 C) 98.3 F (36.8 C) 98.5 F (36.9 C) 98 F (36.7 C)  TempSrc: Oral Axillary Axillary   SpO2: 93% 94% 93% 96%  Weight:      Height:       Supplemental O2: None  Physical Exam Constitutional: tired-appearing man sitting in bed, in no acute distress Cardiovascular: regular rate and rhythm, no m/r/g, no lower extremity edema Pulmonary/Chest: breathing comfortably on room air Neurological/Psych: oriented to person, place, time, and situation, speech and thought process much improved today  Pertinent Labs: CBC Latest Ref Rng & Units 02/27/2020 02/26/2020 02/25/2020  WBC 4.0 - 10.5 K/uL 5.6 5.7 5.6  Hemoglobin 13.0 - 17.0 g/dL 13.1 13.2 13.3  Hematocrit 39.0 - 52.0 % 39.0 38.3(L) 40.2  Platelets 150 - 400 K/uL 123(L) 110(L) 88(L)    CMP Latest Ref Rng & Units 02/26/2020 02/25/2020 02/24/2020  Glucose 70 - 99 mg/dL 89 90 107(H)  BUN 8 - 23 mg/dL 29(H) 39(H) 47(H)  Creatinine 0.61 - 1.24 mg/dL 1.13 1.19 1.21  Sodium 135 - 145 mmol/L 139 141 143  Potassium 3.5 - 5.1 mmol/L 3.9 3.9 4.0  Chloride 98 - 111 mmol/L 104 105 107  CO2 22 - 32 mmol/L 25 24 25   Calcium 8.9 - 10.3 mg/dL 8.4(L) 8.4(L) 8.7(L)  Total Protein 6.5 - 8.1 g/dL - - 5.5(L)  Total Bilirubin 0.3 - 1.2 mg/dL - - 1.2   Alkaline Phos 38 - 126 U/L - - 29(L)  AST 15 - 41 U/L - - 17  ALT 0 - 44 U/L - - 15    Assessment/Plan:   Principal Problem:   Pneumonia due to COVID-19 virus Active Problems:   AKI (acute kidney injury) (HCC)   Thrombocytopenia (HCC)   Rheumatoid arthritis (HCC)   Acute urinary retention   Acute hypoxemic respiratory failure (HCC)   Atrial fibrillation with rapid ventricular response (HCC)   Heme positive stool   Microscopic hematuria   Acute pulmonary embolism ([place])   Patient Summary: Douglas [name] is a 70 y.o. man with with a pertinent PMH of rheumatoid arthritis, hypertension, osteoarthritis, who presented with shortness of breath after recent diagnosis of COVID-19 via PCR on 02/13/20 and was admitted for further evaluation and treatment of acute respiratory failure with hypoxia.  This is hospital day 8. Likely discharge to SNF tomorrow pending confirmed bed availability.  Acute Respiratory Failure with Hypoxia Due to COVID-19 Unvaccinated. Positive PCR test on 02/13/20, today is day 14 since diagnosis. Weaned to room air since 1/9 PM maintaining saturations >90%. - Completed 5 d remdesivir  - Day 9/10 of dexamethasone (Day 1: 02/19/20) - currently on room air - ambulate with pulse ox - IS, flutter valve  - antitussives  Pulmonary embolism  in the setting of COVID-19 - Xarelto per pharmacy  Hemoccult positive Possible melena Hemoglobin 16.1->15.4->14.1->13.8->14.3->13.6>13.3->13.2 this admission.  - GI consulted, appreciate recs - Protonix 40 mg PO BID since hemoglobin is stable - Outpatient follow-up - Monitor closely for signs/symptoms of bleeding - Monitor CBC  Acute metabolic encephalopathy Delirium Patient with continued improvement in his mentation and processing this AM.  - Resume Lyrica - Fall precautions - Delirium precautions  Left hip pain Patient with history of L hip replacement. Able to report his home pain regimen today. Follows with  [facility]. - Increase frequency of hydrocodone-acetaminophen 10-325 mg to q4h (home dose) - Restart pregabalin 150 mg TID  AKI, improved Acute urinary retention Microscopic hematuria Creatinine steadily improving, 1.12 this AM. - AM BMP - Encourage PO fluids - Flomax 0.4 mg daily - Foley placed 02/23/19 - Patient will need repeat urinalysis in 4 weeks given microscopic hematuria  Thrombocytopenia Increased to 123k this morning (87k on admission). - Resuming Lyrica - patient will need repeat CBC at follow-up to ensure platelets remain stable - Continue to monitor  New-onset atrial fibrillation, resolved: Cardiac telemetry  Hypertension - Continue to hold lisinopril-HCTZ in setting of normotension and AKI - Flomax for urinary retention as above  Rheumatoid arthritis: Will hold sulfasalazine for now given treatment with decadron   Diet: Heart Healthy IVF: none VTE: Xarelto per pharmacy Code: Full PT/OT recs: SNF for Subacute PT    Please contact the on call pager after 5 pm and on weekends at [phone].  Alexandria Lodge, MD PGY-1 Internal Medicine Teaching Service Pager: [phone] 02/27/2020

## 2020-02-27 NOTE — Discharge Summary (Signed)
Name: Douglas [name] MRN: [id number] DOB: July 23, 1950 70 y.o. PCP: Tobe Sos, MD  Date of Admission: 02/19/2020  3:28 PM Date of Discharge: 02/28/2020 Attending Physician: Lucious Groves, DO  Discharge Diagnosis:  1. Acute hypoxemic respiratory failure 2. Pneumonia due to COVID-19 virus 3. Acute pulmonary embolism 4. Thrombocytopenia 5. Atrial fibrillation with rapid ventricular response 6. Acute urinary retention 7. Microscopic hematuria 8. Acute kidney injury 9. Heme positive stool   Discharge Medications: Allergies as of 02/28/2020      Reactions   Cefazolin Rash   See 08/2012 admission   Morphine Sulfate    Penicillins Hives   Hydrochlorothiazide Rash   Other Rash   Tide detergent   Oxycodone Rash   Vancomycin Rash   Drug rash      Medication List    STOP taking these medications   famotidine 20 MG tablet Commonly known as: PEPCID   lisinopril-hydrochlorothiazide 20-12.5 MG tablet Commonly known as: ZESTORETIC   ondansetron 4 MG tablet Commonly known as: ZOFRAN     TAKE these medications   atorvastatin 40 MG tablet Commonly known as: LIPITOR Take 40 mg by mouth daily.   brimonidine 0.2 % ophthalmic solution Commonly known as: ALPHAGAN Place 1 drop into both eyes in the morning and at bedtime.   HYDROcodone-acetaminophen 10-325 MG tablet Commonly known as: NORCO Take 1 tablet by mouth every 4 (four) hours as needed (pain).   latanoprost 0.005 % ophthalmic solution Commonly known as: XALATAN Place 1 drop into both eyes at bedtime.   pantoprazole 40 MG tablet Commonly known as: PROTONIX Take 1 tablet (40 mg total) by mouth 2 (two) times daily.   pregabalin 150 MG capsule Commonly known as: LYRICA Take 150 mg by mouth 2 (two) times daily.   Rivaroxaban 15 MG Tabs tablet Commonly known as: XARELTO Take 1 tablet (15 mg total) by mouth 2 (two) times daily with a meal.   rivaroxaban 20 MG Tabs tablet Commonly known as:  XARELTO Take 1 tablet (20 mg total) by mouth daily with supper. Start taking on: March 19, 2020   sulfaSALAzine 500 MG tablet Commonly known as: AZULFIDINE Take 1,000 mg by mouth 2 (two) times daily.   tamsulosin 0.4 MG Caps capsule Commonly known as: FLOMAX Take 1 capsule (0.4 mg total) by mouth daily after supper.   timolol 0.5 % ophthalmic solution Commonly known as: TIMOPTIC Place 1 drop into both eyes daily.       Disposition and follow-up:   Douglas [name] was discharged from [facility] in Stable condition.  At the hospital follow up visit please address:  1.    Pneumonia due to COVID-19 virus - Positive PCR test on 02/13/20 - COVID recovered: date of discharge is day 2 since diagnosis - Discharged on room air - Counseled patient to get vaccinated after 3 months  Acute pulmonary embolism - Transitioned from heparin to Xarelto on 02/27/2020 - Recommend at [place] 3 months of anticoagulation for provoked PE  Thrombocytopenia - Platelet count 87k on admission; Lyrica held - Platelet count 123k on 02/27/20 prior to discharge; Lyrica resumed - Recommend repeat CBC 1-week post-discharge  Acute urinary retention - Foley placed 02/23/19 for persistent acute urinary retention - Flomax 0.4 mg started during admission; titrate up as necessary - Continued at discharge; trial of void at your discretion - Patient may need urology consult  Microscopic hematuria - Detected on urinalysis at admission (02/20/20) - Recommend repeat urinalysis 4 weeks after (~  03/19/20)  Acute kidney injury - sCr 2.80 - Suspect pre-renal; improved with fluids - sCr 1.12 on 02/27/20  Heme positive stool / possible GI bleed - POCT positive on 02/20/19 after dark stools noted - Hemoglobin noted to be normal but decreasing 16.1->15.4->14.1-> GI consulted, debating EGD/colonoscopy, risk outweighed benefit, started on IV Protonix, later transitioned to PO. Hemoglobin  13.8->14.3->13.6>13.3->13.2 on discharge. - Ensure continuing Protonix 40 mg PO BID  - Ensure GI follow-up - CBC at discharge  Left hip pain - Patient with significant improvement in his chronic left hip pain with resumption of home pain medications. Follows with [facility]. - hydrocodone-acetaminophen 10-325 mg to q4h PRN - Pregabalin 150 mg TID (Lyrica held initially in setting of thrombocytopenia and waxing and waning mental status)  Hypertension - Continue to hold home lisinopril-HCTZ in the setting of normotension - Resume as BP tolerates - Flomax for urinary retention as above  2.  Labs / imaging needed at time of follow-up: CBC with diff, BMP  3.  Pending labs/ test needing follow-up: None   Follow-up Appointments:  Contact information for follow-up providers    Tobe Sos, MD. Schedule an appointment as soon as possible for a visit in 1 week(s).   Specialty: Internal Medicine Contact information: [place] [address] [phone]            Contact information for after-discharge care    [facility] .   Service: Skilled Nursing Contact information: [address] [place] Hospital Course by problem list:  Douglas [name] is a 70 y.o. man with with a pertinent PMH of rheumatoid arthritis, hypertension, osteoarthritis, who presented with shortness of breath after recent diagnosis of COVID-19 via PCR on 02/13/20 and was admitted for further evaluation and treatment of acute respiratory failure with hypoxia.  Pneumonia due to COVID-19 virus Had a positive PCR test on 02/13/20. Presented on 02/19/19 with acute hypoxic respiratory failure requiring as high as 9 L HFNC. Weaned to room air by discharge. Received 5 d of remdesivir and 10 d prednisone. Supported with antitussives, incentive spirometer. Inflammatory markers were elevated  at admission and gradually trended downward. Evaluated by PT who recommended discharge to SNF for subacute rehab. Counseled him to get his COVID vaccine in 3 months.   Acute pulmonary embolism D-dimer elevated on admission. V/Q scan on admission consistent with acute pulmonary embolism. Initiated on heparin which he was continued on until hemoglobin stabilized and GI was not going to do inpatient EGD/colonoscopy. Transitioned from heparin to Xarelto on 02/27/2020. Recommend at [place] 3 months of anticoagulation for provoked PE  Thrombocytopenia On admission, platelet count 87k. Downtrended as low as 70k. Lyrica held initially as suspected to be possibly contributing. Platelets were monitored, and platelet count 123k on 02/27/20 prior to discharge. Lyrica resumed. Suspect secondary to acute illness from [place]. Recommend repeat CBC 1-week post-discharge  Acute urinary retention Foley placed 02/23/19 for persistent acute urinary retention requiring repeat I&O catheterization. Flomax 0.4 mg started. Foley continued at discharge. Patient may need urology consult  Microscopic hematuria - Detected on urinalysis at admission (02/20/20) - Recommend repeat urinalysis 4 weeks after (~03/19/20)  Acute kidney injury sCr 2.80 on admission. Suspect pre-renal; improved with fluids. sCr 1.12 on 02/27/20.  Heme positive stool / possible GI bleed - POCT positive on 02/20/19  after dark stools noted - Hemoglobin noted to be normal but decreasing 16.1->15.4->14.1-> GI consulted, debating EGD/colonoscopy, risk outweighed benefit, started on IV Protonix, later transitioned to PO. Hemoglobin 13.8->14.3->13.6>13.3->13.2 on discharge. - Ensure continuing Protonix 40 mg PO BID  - Ensure GI follow-up - CBC at discharge  Left hip pain - Patient with significant improvement in his chronic left hip pain with resumption of home pain medications. Follows with [facility]. - hydrocodone-acetaminophen 10-325 mg to q4h  PRN - Pregabalin 150 mg TID (Lyrica held initially in setting of thrombocytopenia and waxing and waning mental status)  Hypertension - Continue to hold home lisinopril-HCTZ in the setting of normotension during admission - Resume as BP tolerates - Flomax for urinary retention as above  Rheumatoid arthritis - Sulfasalazine held at admission given course of steroids - Resumed at discharge  Discharge Vitals:   BP 110/83 (BP Location: Right Arm)   Pulse 83   Temp 98.5 F (36.9 C) (Oral)   Resp 19   Ht 6' (1.829 m)   Wt 106.7 kg   SpO2 91%   BMI 31.90 kg/m   Pertinent Labs, Studies, and Procedures:   NM Pulmonary Perfusion  Result Date: 02/20/2020 CLINICAL DATA:  Shortness of breath and history of COVID-19 positivity EXAM: NUCLEAR MEDICINE PERFUSION LUNG SCAN TECHNIQUE: Perfusion images were obtained in multiple projections after intravenous injection of radiopharmaceutical. Ventilation scans intentionally deferred if perfusion scan and chest x-ray adequate for interpretation during COVID 19 epidemic. RADIOPHARMACEUTICALS:  4.3 mCi Tc-61m MAA IV COMPARISON:  Chest x-ray from the previous day. FINDINGS: Uptake is noted bilaterally although multiple wedge-shaped defects are identified throughout both lungs consistent with pulmonary emboli. These appear greater than the associated parenchymal opacity seen on recent chest x-ray particularly in the right upper lobe. IMPRESSION: Changes consistent with pulmonary emboli. Electronically Signed   By: Inez Catalina M.D.   On: 02/20/2020 16:01   DG Chest Port 1 View  Result Date: 02/19/2020 CLINICAL DATA:  70 year old male with hypoxia and shortness of breath. EXAM: PORTABLE CHEST 1 VIEW COMPARISON:  Chest radiograph dated 11/24/2011 FINDINGS: Bilateral patchy and streaky densities most consistent with multifocal pneumonia, possibly viral or atypical in etiology. Clinical correlation recommended. No lobar consolidation, pleural effusion or pneumothorax.  Stable cardiac silhouette. No acute osseous pathology. Degenerative changes of the shoulders. IMPRESSION: Multifocal pneumonia. Clinical correlation and follow-up recommended. Electronically Signed   By: Anner Crete M.D.   On: 02/19/2020 16:59    Discharge Instructions: Discharge Instructions    Call MD for:  difficulty breathing, headache or visual disturbances   Complete by: As directed    Call MD for:  extreme fatigue   Complete by: As directed    Call MD for:  persistant dizziness or light-headedness   Complete by: As directed    Call MD for:  persistant nausea and vomiting   Complete by: As directed    Call MD for:  severe uncontrolled pain   Complete by: As directed    Diet - low sodium heart healthy   Complete by: As directed    Increase activity slowly   Complete by: As directed       Douglas [name],   It was a pleasure taking care of you.   - COVID-19 pneumonia: You were admitted to the hospital and treated for COVID-19 pneumonia. You were treated for COVID with remdesivir, steroids, and supplemental oxygen. - Pulmonary embolism: You were also found to have a pulmonary embolism (blood clot in your  lungs)  You were treated for your pulmonary embolism first with intravenous heparin and then switched to rivaroxaban (Xarelto). For Xarelto, you will take 15 mg twice daily for 21 days followed by 20 mg daily for at least 3 months.  - Urinary retention: A Foley catheter was placed during this hospitalization because you were unable to urinate. You were started on a medication, Flomax, to help with this. While you are at the skilled nursing facility for rehab, the catheter may be able to be removed for a trial of voiding.  - Blood pressure: we held your lisinopril-hydrochlorothiazide during this hospitalization because your blood pressure was normal. Recommend continue to hold until your blood pressure becomes elevated. - GI bleeding: While you were hospitalized, you were noted to  have dark stools and a drop in your hemoglobin concerning for a bleed in your GI tract. You are being treated with antacid medication. You will continue this pantoprazole 40 mg twice daily until you are able to follow up with them.   We would like for you to follow-up with your primary care physician in about 1 week to have some repeat bloodwork.  Take care!    Signed: Alexandria Lodge, MD 02/28/2020, 12:30 PM   Pager: [phone]

## 2020-02-27 NOTE — Care Management Important Message (Signed)
Important Message  Patient Details  Name: Douglas [name] MRN: [id number] Date of Birth: 1950-07-25   Medicare Important Message Given:  Yes - Important Message mailed due to current National Emergency  Verbal consent obtained due to current National Emergency  Relationship to patient: Self Contact Name: Montrey Buist Call Date: 02/27/20  Time: 1153 Phone: [phone] Outcome: Spoke with contact Important Message mailed to: Patient address on file    Delorse Lek 02/27/2020, 11:54 AM

## 2020-02-27 NOTE — Progress Notes (Signed)
ANTICOAGULATION CONSULT NOTE - Follow Up Consult  Pharmacy Consult for heparin Indication: pulmonary embolus  Labs: Recent Labs    02/25/20 0351 02/26/20 0157 02/27/20 0222  HGB 13.3 13.2 13.1  HCT 40.2 38.3* 39.0  PLT 88* 110* 123*  HEPARINUNFRC 0.39 0.29* 0.13*  CREATININE 1.19 1.13  --     Assessment: 69yo male subtherapeutic on heparin after several levels at goal though had been trending down; no gtt issues or signs of bleeding per RN.  Goal of Therapy:  Heparin level 0.3-0.5 units/ml   Plan:  Will increase heparin gtt by 2 units/kg/hr to 1100 units/hr and check level in 6 hours.    Wynona Neat, PharmD, BCPS  02/27/2020,3:52 AM

## 2020-02-27 NOTE — TOC Progression Note (Signed)
Transition of Care [facility]) - Progression Note    Patient Details  Name: Douglas [name] MRN: [id number] Date of Birth: Feb 01, 1951  Transition [facility] CM/SW [place], LCSW Phone Number: 02/27/2020, 11:47 AM  Clinical Narrative:    CSW made patient's spouse aware that [facility] has a bed available and she is in agreement with plan. She requested MD place patient back on Lyrica; CSW passed message to Dr. Shon Baton. CSW awaiting insurance approval for SNF.    Expected Discharge Plan: Skilled Nursing Facility Barriers to Discharge: Insurance Authorization  Expected Discharge Plan and Services Expected Discharge Plan: [place] In-house Referral: Clinical Social Work     Living arrangements for the past 2 months: Single Family Home                                       Social Determinants of Health (SDOH) Interventions    Readmission Risk Interventions No flowsheet data found.

## 2020-02-28 MED ORDER — RIVAROXABAN 20 MG PO TABS: 20.0000 mg | ORAL_TABLET | Freq: Every day | ORAL | Status: DC

## 2020-02-28 MED ORDER — HYDROCODONE-ACETAMINOPHEN 10-325 MG PO TABS: 1.0000 | ORAL_TABLET | ORAL | 0 refills | Status: AC | PRN

## 2020-02-28 MED ORDER — RIVAROXABAN 15 MG PO TABS: 15.0000 mg | ORAL_TABLET | Freq: Two times a day (BID) | ORAL | Status: DC

## 2020-02-28 MED ORDER — PANTOPRAZOLE SODIUM 40 MG PO TBEC: 40.0000 mg | DELAYED_RELEASE_TABLET | Freq: Two times a day (BID) | ORAL | 0 refills | Status: DC

## 2020-02-28 MED ORDER — TAMSULOSIN HCL 0.4 MG PO CAPS: 0.4000 mg | ORAL_CAPSULE | Freq: Every day | ORAL | Status: DC

## 2020-02-28 NOTE — NC FL2 (Signed)
[place] LEVEL OF CARE SCREENING TOOL     IDENTIFICATION  Patient Name: Douglas [name] Birthdate: 1950-05-18 Sex: male Admission Date (Current Location): 02/19/2020  [facility] [place] Number:  Herbalist and Address:  [facility], [place] [address] [place], [place] [address]      Provider Number: [id number]  Attending Physician Name and Address:  Lucious Groves, DO  Relative Name and Phone Number:  Hilda Blades [phone]    Current Level of Care: Hospital Recommended Level of Care: [place] Prior Approval Number:    Date Approved/Denied:   PASRR Number: [id number]  Discharge Plan: SNF    Current Diagnoses: Patient Active Problem List   Diagnosis Date Noted  . Acute pulmonary embolism ([place])   . Acute urinary retention 02/20/2020  . Acute hypoxemic respiratory failure ([place]) 02/20/2020  . Atrial fibrillation with rapid ventricular response ([place]) 02/20/2020  . Heme positive stool 02/20/2020  . Microscopic hematuria 02/20/2020  . Pneumonia due to COVID-19 virus 02/19/2020  . AKI (acute kidney injury) ([place]) 02/19/2020  . Thrombocytopenia ([place]) 02/19/2020  . Rheumatoid arthritis ([place]) 02/19/2020    Orientation RESPIRATION BLADDER Height & Weight     Self,Time,Situation,Place (WDL)  O2 (2L Nasal cannula) Continent (Urethral Catheter Non latex 16 Fri.) Weight: 235 lb 3.7 oz (106.7 kg) Height:  6' (182.9 cm)  BEHAVIORAL SYMPTOMS/MOOD NEUROLOGICAL BOWEL NUTRITION STATUS      Incontinent (Rectal Tube) Diet (See Discharge Summary)  AMBULATORY STATUS COMMUNICATION OF NEEDS Skin   Limited Assist Verbally Other (Comment) (Ecchymosis, arm, left,upper)                       Personal Care Assistance Level of Assistance  Bathing,Feeding,Dressing Bathing Assistance: Limited assistance Feeding assistance: Independent (able to feed self;Cardiac) Dressing Assistance: Limited assistance     Functional  Limitations Info  Sight,Hearing,Speech Sight Info: Impaired Hearing Info: Adequate Speech Info: Adequate    SPECIAL CARE FACTORS FREQUENCY  PT (By licensed PT),OT (By licensed OT)     PT Frequency: 5x min weekly OT Frequency: 5x min weekly            Contractures Contractures Info: Not present    Additional Factors Info  Code Status,Allergies Code Status Info: FULL Allergies Info: Cefazolin,Morphine Sulfate,Penicillins,Hydrochlorothiazide, other (Tide detergent),Oxycodone,Vancomycin           Current Medications (02/28/2020):  This is the current hospital active medication list Current Facility-Administered Medications  Medication Dose Route Frequency Provider Last Rate Last Admin  . brimonidine (ALPHAGAN) 0.2 % ophthalmic solution 1 drop  1 drop Both Eyes BID Jean Rosenthal, MD   1 drop at 02/28/20 0813  . Chlorhexidine Gluconate Cloth 2 % PADS 6 each  6 each Topical Daily Lucious Groves, DO   6 each at 02/28/20 0813  . fluticasone (FLONASE) 50 MCG/ACT nasal spray 2 spray  2 spray Each Nare Daily Alexandria Lodge, MD   2 spray at 02/28/20 [phone]  . guaiFENesin-dextromethorphan (ROBITUSSIN DM) 100-10 MG/5ML syrup 10 mL  10 mL Oral Q4H PRN Jean Rosenthal, MD   10 mL at 02/26/20 2049  . HYDROcodone-acetaminophen (NORCO) 10-325 MG per tablet 1 tablet  1 tablet Oral Q4H PRN Lucious Groves, DO   1 tablet at 02/28/20 0507  . latanoprost (XALATAN) 0.005 % ophthalmic solution 1 drop  1 drop Both Eyes QHS Jean Rosenthal, MD   1 drop at 02/28/20 0812  . lidocaine (  LIDODERM) 5 % 1 patch  1 patch Transdermal Q24H Alexandria Lodge, MD   1 patch at 02/28/20 [phone]  . pantoprazole (PROTONIX) EC tablet 40 mg  40 mg Oral BID Vena Rua, PA-C   40 mg at 02/28/20 6606  . polyethylene glycol (MIRALAX / GLYCOLAX) packet 17 g  17 g Oral Daily PRN Jean Rosenthal, MD   17 g at 02/24/20 2224  . pregabalin (LYRICA) capsule 150 mg  150 mg Oral TID Joni Reining C, DO   150 mg at 02/28/20 0814  . Rivaroxaban  (XARELTO) tablet 15 mg  15 mg Oral BID WC Skeet Simmer, RPH   15 mg at 02/28/20 0815   Followed by  . [START ON 03/19/2020] rivaroxaban (XARELTO) tablet 20 mg  20 mg Oral Q supper Skeet Simmer, [facility]      . tamsulosin [facility]) capsule 0.4 mg  0.4 mg Oral QPC supper Alexandria Lodge, MD   0.4 mg at 02/27/20 1740  . timolol (TIMOPTIC) 0.5 % ophthalmic solution 1 drop  1 drop Both Eyes Daily Jean Rosenthal, MD   1 drop at 02/28/20 0815     Discharge Medications: Please see discharge summary for a list of discharge medications.  Relevant Imaging Results:  Relevant Lab Results:   Additional Information [phone]  Benard Halsted, LCSW

## 2020-02-28 NOTE — Progress Notes (Signed)
Report called to [facility]- Nurse [facility]) @ [phone].

## 2020-02-28 NOTE — TOC Progression Note (Signed)
Transition of Care [facility]) - Progression Note    Patient Details  Name: Douglas [name] MRN: [id number] Date of Birth: 1950-08-06  Transition of Care [facility]) CM/SW [place], LCSW Phone Number: 02/28/2020, 8:53 AM  Clinical Narrative:    CSW received insurance approval for [facility]: # [id number], effective 02/27/20-02/29/20.   Expected Discharge Plan: Skilled Nursing Facility Barriers to Discharge: Insurance Authorization  Expected Discharge Plan and Services Expected Discharge Plan: [place] In-house Referral: Clinical Social Work     Living arrangements for the past 2 months: Single Family Home                                       Social Determinants of Health (SDOH) Interventions    Readmission Risk Interventions No flowsheet data found.

## 2020-02-28 NOTE — TOC Transition Note (Addendum)
Transition of Care [facility]) - CM/SW Discharge Note   Patient Details  Name: Douglas [name] MRN: [id number] Date of Birth: Aug 30, 1950  Transition of [facility]) CM/SW Contact:  Benard Halsted, [place] Phone Number: 02/28/2020, 2:21 PM   Clinical Narrative:    Patient will DC to: [facility] Anticipated DC date: 02/28/20 Family notified: Spouse, Hilda Blades (unable to reach today, phone has been busy for the past few hours) but [facility] liaison spoke with her this morning and she is aware of dc today Transport by: Corey Harold (running behind)   Per MD patient ready for DC to [facility]. RN to call report prior to discharge [phone]). RN, patient, patient's family, and facility notified of DC. Discharge Summary and FL2 sent to facility. DC packet on chart; please make sure signed script goes with patient. Ambulance transport requested for patient.   CSW will sign off for now as social work intervention is no longer needed. Please consult [place] again if new needs arise.      Final next level of care: Skilled Nursing Facility Barriers to Discharge: Barriers Resolved   Patient Goals and CMS Choice Patient states their goals for this hospitalization and ongoing recovery are:: Rehab CMS Medicare.gov Compare Post Acute Care list provided to:: Patient Represenative (must comment) (Debra Spouse) Choice offered to / list presented to : Spouse Hilda Blades)  Discharge Placement   Existing PASRR number confirmed : 02/28/20          Patient chooses bed at: [facility] Patient to be transferred to facility by: [place] Name of family member notified: Debra Patient and family notified of of transfer: 02/28/20  Discharge Plan and Services In-house Referral: Clinical Social Work                                   Social Determinants of Health ([place]) Interventions     Readmission Risk Interventions No flowsheet data found.

## 2020-02-28 NOTE — Progress Notes (Signed)
HD#9 Subjective:   No acute events overnight.  During evaluation at bedside, the patient states he feels fine this am and denies [facility] n/v/d. Informed him he would be discharged today and would continue the protonix and follow up with GI doctors.   Objective:   Vital signs in last 24 hours: Vitals:   02/27/20 1614 02/27/20 2000 02/28/20 0000 02/28/20 0359  BP: 135/76 136/76 129/77 122/79  Pulse: 70 82 64 68  Resp: 17 19 15 17   Temp: 98.6 F (37 C) 98.5 F (36.9 C) 98.3 F (36.8 C) 98.7 F (37.1 C)  TempSrc:  Axillary Oral Oral  SpO2: 90% 90% 90% 90%  Weight:      Height:       Supplemental O2: None  Physical Exam Physical Exam Constitutional:      General: He is not in acute distress.    Appearance: He is ill-appearing.  Cardiovascular:     Rate and Rhythm: Normal rate and regular rhythm.     Heart sounds: No murmur heard. No friction rub. No gallop.   Pulmonary:     Effort: Pulmonary effort is normal.     Breath sounds: Normal breath sounds. No decreased breath sounds, wheezing, rhonchi or rales.  Musculoskeletal:     Right lower leg: No edema.     Left lower leg: No edema.  Neurological:     Mental Status: He is alert and oriented to person, place, and time.  Psychiatric:        Behavior: Behavior normal.     Pertinent Labs: CBC Latest Ref Rng & Units 02/27/2020 02/26/2020 02/25/2020  WBC 4.0 - 10.5 K/uL 5.6 5.7 5.6  Hemoglobin 13.0 - 17.0 g/dL 13.1 13.2 13.3  Hematocrit 39.0 - 52.0 % 39.0 38.3(L) 40.2  Platelets 150 - 400 K/uL 123(L) 110(L) 88(L)    CMP Latest Ref Rng & Units 02/27/2020 02/26/2020 02/25/2020  Glucose 70 - 99 mg/dL 113(H) 89 90  BUN 8 - 23 mg/dL 30(H) 29(H) 39(H)  Creatinine 0.61 - 1.24 mg/dL 1.12 1.13 1.19  Sodium 135 - 145 mmol/L 135 139 141  Potassium 3.5 - 5.1 mmol/L 4.9 3.9 3.9  Chloride 98 - 111 mmol/L 101 104 105  CO2 22 - 32 mmol/L 23 25 24   Calcium 8.9 - 10.3 mg/dL 8.5(L) 8.4(L) 8.4(L)  Total Protein 6.5 - 8.1 g/dL - - -   Total Bilirubin 0.3 - 1.2 mg/dL - - -  Alkaline Phos 38 - 126 U/L - - -  AST 15 - 41 U/L - - -  ALT 0 - 44 U/L - - -    Assessment/Plan:   Principal Problem:   Pneumonia due to COVID-19 virus Active Problems:   AKI (acute kidney injury) (HCC)   Thrombocytopenia (HCC)   Rheumatoid arthritis (HCC)   Acute urinary retention   Acute hypoxemic respiratory failure (HCC)   Atrial fibrillation with rapid ventricular response (HCC)   Heme positive stool   Microscopic hematuria   Acute pulmonary embolism (HCC)   Patient Summary: Douglas [name] is a 70 y.o. man with with a pertinent PMH of rheumatoid arthritis, hypertension, osteoarthritis, who presented with shortness of breath after recent diagnosis of COVID-19 via PCR on 02/13/20 and was admitted for further evaluation and treatment of acute respiratory failure with hypoxia.  This is hospital day 9.   Acute Respiratory Failure with Hypoxia Due to COVID-19 Unvaccinated. Positive PCR test on 02/13/20, today is day 14 since diagnosis. Weaned to room air  since 1/9 PM maintaining saturations >90%. - Completed remdesivir  - Last dose of dexamethasone - IS, flutter valve  - antitussives  Pulmonary embolism in the setting of COVID-19 - Xarelto per pharmacy  Hemoccult positive Possible melena Hemoglobin 16.1->15.4->14.1->13.8->14.3->13.6>13.3->13.2 this admission.  - GI consulted, appreciate recs - Protonix 40 mg PO BID - Outpatient follow-up - Monitor closely for signs/symptoms of bleeding  Acute metabolic encephalopathy Delirium Patient with continued improvement in his mentation and processing this AM.  - Continue Lyrica - Fall precautions - Delirium precautions  Left hip pain Patient with history of L hip replacement. Able to report his home pain regimen today. Follows with [facility]. - Increase frequency of hydrocodone-acetaminophen 10-325 mg to q4h (home dose) - Restart pregabalin 150 mg TID  AKI,  improved Acute urinary retention Microscopic hematuria Improved throughout stay. - Continue PO fluids - Flomax 0.4 mg daily - Foley placed 02/23/19 - Patient will need repeat urinalysis in 4 weeks given microscopic hematuria  Thrombocytopenia Steadily increased throughout hospital course with the latest 123k. - Will need repeat CBC at follow-up to ensure platelets remain stable - Continue to monitor  New-onset atrial fibrillation, resolved: Cardiac telemetry  Hypertension - Continue to hold lisinopril-HCTZ in setting of normotension and AKI - Flomax for urinary retention as above  Rheumatoid arthritis: Will hold sulfasalazine for now given treatment with decadron   Diet: Heart Healthy IVF: none VTE: Xarelto per pharmacy Code: Full PT/OT recs: SNF for Subacute PT    Please contact the on call pager after 5 pm and on weekends at [phone].  Maudie Mercury, MD PGY-2 Internal Medicine Teaching Service Pager: [phone] 02/28/2020

## 2020-04-11 ENCOUNTER — Ambulatory Visit: Payer: Medicare Other | Admitting: Nurse Practitioner

## 2021-04-23 DIAGNOSIS — M4726 Other spondylosis with radiculopathy, lumbar region: Secondary | ICD-10-CM | POA: Diagnosis not present

## 2021-04-23 DIAGNOSIS — M4722 Other spondylosis with radiculopathy, cervical region: Secondary | ICD-10-CM | POA: Diagnosis not present

## 2021-04-23 DIAGNOSIS — Z79891 Long term (current) use of opiate analgesic: Secondary | ICD-10-CM | POA: Diagnosis not present

## 2021-04-23 DIAGNOSIS — G894 Chronic pain syndrome: Secondary | ICD-10-CM | POA: Diagnosis not present

## 2021-05-05 DIAGNOSIS — J449 Chronic obstructive pulmonary disease, unspecified: Secondary | ICD-10-CM | POA: Diagnosis not present

## 2021-06-05 DIAGNOSIS — J449 Chronic obstructive pulmonary disease, unspecified: Secondary | ICD-10-CM | POA: Diagnosis not present

## 2021-06-18 DIAGNOSIS — G894 Chronic pain syndrome: Secondary | ICD-10-CM | POA: Diagnosis not present

## 2021-06-18 DIAGNOSIS — M4726 Other spondylosis with radiculopathy, lumbar region: Secondary | ICD-10-CM | POA: Diagnosis not present

## 2021-06-18 DIAGNOSIS — Z79891 Long term (current) use of opiate analgesic: Secondary | ICD-10-CM | POA: Diagnosis not present

## 2021-06-18 DIAGNOSIS — M4722 Other spondylosis with radiculopathy, cervical region: Secondary | ICD-10-CM | POA: Diagnosis not present

## 2021-06-19 DIAGNOSIS — D649 Anemia, unspecified: Secondary | ICD-10-CM | POA: Diagnosis not present

## 2021-06-19 DIAGNOSIS — M069 Rheumatoid arthritis, unspecified: Secondary | ICD-10-CM | POA: Diagnosis not present

## 2021-06-19 DIAGNOSIS — E7800 Pure hypercholesterolemia, unspecified: Secondary | ICD-10-CM | POA: Diagnosis not present

## 2021-06-19 DIAGNOSIS — I10 Essential (primary) hypertension: Secondary | ICD-10-CM | POA: Diagnosis not present

## 2021-07-05 DIAGNOSIS — J449 Chronic obstructive pulmonary disease, unspecified: Secondary | ICD-10-CM | POA: Diagnosis not present

## 2021-07-15 DIAGNOSIS — H401112 Primary open-angle glaucoma, right eye, moderate stage: Secondary | ICD-10-CM | POA: Diagnosis not present

## 2021-07-15 DIAGNOSIS — H401123 Primary open-angle glaucoma, left eye, severe stage: Secondary | ICD-10-CM | POA: Diagnosis not present

## 2021-07-15 DIAGNOSIS — H04123 Dry eye syndrome of bilateral lacrimal glands: Secondary | ICD-10-CM | POA: Diagnosis not present

## 2021-08-05 DIAGNOSIS — J449 Chronic obstructive pulmonary disease, unspecified: Secondary | ICD-10-CM | POA: Diagnosis not present

## 2021-08-13 DIAGNOSIS — G894 Chronic pain syndrome: Secondary | ICD-10-CM | POA: Diagnosis not present

## 2021-08-13 DIAGNOSIS — M4726 Other spondylosis with radiculopathy, lumbar region: Secondary | ICD-10-CM | POA: Diagnosis not present

## 2021-08-13 DIAGNOSIS — Z79891 Long term (current) use of opiate analgesic: Secondary | ICD-10-CM | POA: Diagnosis not present

## 2021-08-13 DIAGNOSIS — M4722 Other spondylosis with radiculopathy, cervical region: Secondary | ICD-10-CM | POA: Diagnosis not present

## 2021-09-04 DIAGNOSIS — J449 Chronic obstructive pulmonary disease, unspecified: Secondary | ICD-10-CM | POA: Diagnosis not present

## 2021-10-05 DIAGNOSIS — J449 Chronic obstructive pulmonary disease, unspecified: Secondary | ICD-10-CM | POA: Diagnosis not present

## 2021-10-08 DIAGNOSIS — M4722 Other spondylosis with radiculopathy, cervical region: Secondary | ICD-10-CM | POA: Diagnosis not present

## 2021-10-08 DIAGNOSIS — M4726 Other spondylosis with radiculopathy, lumbar region: Secondary | ICD-10-CM | POA: Diagnosis not present

## 2021-10-08 DIAGNOSIS — Z79891 Long term (current) use of opiate analgesic: Secondary | ICD-10-CM | POA: Diagnosis not present

## 2021-10-08 DIAGNOSIS — G894 Chronic pain syndrome: Secondary | ICD-10-CM | POA: Diagnosis not present

## 2021-10-28 ENCOUNTER — Ambulatory Visit: Payer: Medicare Other | Admitting: Podiatry

## 2021-10-28 DIAGNOSIS — S90822A Blister (nonthermal), left foot, initial encounter: Secondary | ICD-10-CM

## 2021-10-30 ENCOUNTER — Encounter: Payer: Self-pay | Admitting: Podiatry

## 2021-10-30 NOTE — Progress Notes (Signed)
  Subjective:  Patient ID: Douglas [name], male    DOB: Aug 22, 1950,  MRN: [id number]  Chief Complaint  Patient presents with   SKIN BLISTER    Rm 4  Left lateral skin blister x 2 days ago. Fluid filled blister that popped 1 day ago. Now has some redness and edema.     71 y.o. male presents with the above complaint. History confirmed with patient.  Seems to be improving  Objective:  Physical Exam: warm, good capillary refill, no trophic changes or ulcerative lesions, normal DP and PT pulses, normal sensory exam, and previous blister lateral foot  Assessment:   1. Blister of left foot, initial encounter      Plan:  Patient was evaluated and treated and all questions answered.  Seems to be healing well I think he continue his regular normal bathing.  Apply moisturizing lotion to keep the skin healthy.  He will return as needed if it got returns or worsens  No follow-ups on file.

## 2021-11-05 DIAGNOSIS — J449 Chronic obstructive pulmonary disease, unspecified: Secondary | ICD-10-CM | POA: Diagnosis not present

## 2021-12-03 DIAGNOSIS — G894 Chronic pain syndrome: Secondary | ICD-10-CM | POA: Diagnosis not present

## 2021-12-03 DIAGNOSIS — Z79891 Long term (current) use of opiate analgesic: Secondary | ICD-10-CM | POA: Diagnosis not present

## 2021-12-03 DIAGNOSIS — M4726 Other spondylosis with radiculopathy, lumbar region: Secondary | ICD-10-CM | POA: Diagnosis not present

## 2021-12-03 DIAGNOSIS — M4722 Other spondylosis with radiculopathy, cervical region: Secondary | ICD-10-CM | POA: Diagnosis not present

## 2021-12-05 DIAGNOSIS — J449 Chronic obstructive pulmonary disease, unspecified: Secondary | ICD-10-CM | POA: Diagnosis not present

## 2021-12-15 ENCOUNTER — Other Ambulatory Visit (HOSPITAL_BASED_OUTPATIENT_CLINIC_OR_DEPARTMENT_OTHER): Payer: Self-pay

## 2021-12-15 MED ORDER — HYDROCODONE-ACETAMINOPHEN 10-325 MG PO TABS
ORAL_TABLET | ORAL | 0 refills | Status: DC
  Filled 2021-12-15: qty 180, 30d supply, fill #0

## 2021-12-16 DIAGNOSIS — I10 Essential (primary) hypertension: Secondary | ICD-10-CM | POA: Diagnosis not present

## 2021-12-16 DIAGNOSIS — E7800 Pure hypercholesterolemia, unspecified: Secondary | ICD-10-CM | POA: Diagnosis not present

## 2021-12-16 DIAGNOSIS — Z79899 Other long term (current) drug therapy: Secondary | ICD-10-CM | POA: Diagnosis not present

## 2021-12-16 DIAGNOSIS — M069 Rheumatoid arthritis, unspecified: Secondary | ICD-10-CM | POA: Diagnosis not present

## 2022-01-05 DIAGNOSIS — J449 Chronic obstructive pulmonary disease, unspecified: Secondary | ICD-10-CM | POA: Diagnosis not present

## 2022-01-13 DIAGNOSIS — H04123 Dry eye syndrome of bilateral lacrimal glands: Secondary | ICD-10-CM | POA: Diagnosis not present

## 2022-01-13 DIAGNOSIS — H401112 Primary open-angle glaucoma, right eye, moderate stage: Secondary | ICD-10-CM | POA: Diagnosis not present

## 2022-01-13 DIAGNOSIS — H401123 Primary open-angle glaucoma, left eye, severe stage: Secondary | ICD-10-CM | POA: Diagnosis not present

## 2022-01-16 ENCOUNTER — Other Ambulatory Visit (HOSPITAL_BASED_OUTPATIENT_CLINIC_OR_DEPARTMENT_OTHER): Payer: Self-pay

## 2022-01-19 ENCOUNTER — Other Ambulatory Visit (HOSPITAL_BASED_OUTPATIENT_CLINIC_OR_DEPARTMENT_OTHER): Payer: Self-pay

## 2022-01-19 ENCOUNTER — Other Ambulatory Visit (HOSPITAL_COMMUNITY): Payer: Self-pay

## 2022-01-19 MED ORDER — HYDROCODONE-ACETAMINOPHEN 10-325 MG PO TABS
1.0000 | ORAL_TABLET | ORAL | 0 refills | Status: DC | PRN
  Filled 2022-01-19: qty 180, 30d supply, fill #0

## 2022-01-28 ENCOUNTER — Other Ambulatory Visit (HOSPITAL_COMMUNITY): Payer: Self-pay

## 2022-01-28 DIAGNOSIS — M4722 Other spondylosis with radiculopathy, cervical region: Secondary | ICD-10-CM | POA: Diagnosis not present

## 2022-01-28 DIAGNOSIS — G894 Chronic pain syndrome: Secondary | ICD-10-CM | POA: Diagnosis not present

## 2022-01-28 DIAGNOSIS — Z79891 Long term (current) use of opiate analgesic: Secondary | ICD-10-CM | POA: Diagnosis not present

## 2022-01-28 DIAGNOSIS — M4726 Other spondylosis with radiculopathy, lumbar region: Secondary | ICD-10-CM | POA: Diagnosis not present

## 2022-01-28 MED ORDER — HYDROCODONE-ACETAMINOPHEN 10-325 MG PO TABS: 1.0000 | ORAL_TABLET | ORAL | 0 refills | Status: DC | PRN

## 2022-01-28 MED ORDER — HYDROCODONE-ACETAMINOPHEN 10-325 MG PO TABS
1.0000 | ORAL_TABLET | ORAL | 0 refills | Status: DC | PRN
  Filled 2022-02-20: qty 180, 30d supply, fill #0

## 2022-01-28 MED ORDER — PREGABALIN 150 MG PO CAPS
150.0000 mg | ORAL_CAPSULE | Freq: Three times a day (TID) | ORAL | 1 refills | Status: DC
Start: 2022-01-28 — End: 2022-06-10
  Filled 2022-01-28: qty 90, 30d supply, fill #0

## 2022-01-28 MED ORDER — SULFASALAZINE 500 MG PO TABS
2000.0000 mg | ORAL_TABLET | Freq: Two times a day (BID) | ORAL | 2 refills | Status: DC
  Filled 2022-01-28 – 2022-06-16 (×2): qty 240, 30d supply, fill #0

## 2022-02-04 DIAGNOSIS — J449 Chronic obstructive pulmonary disease, unspecified: Secondary | ICD-10-CM | POA: Diagnosis not present

## 2022-02-10 ENCOUNTER — Other Ambulatory Visit (HOSPITAL_COMMUNITY): Payer: Self-pay

## 2022-02-20 ENCOUNTER — Other Ambulatory Visit (HOSPITAL_COMMUNITY): Payer: Self-pay

## 2022-02-23 ENCOUNTER — Other Ambulatory Visit (HOSPITAL_COMMUNITY): Payer: Self-pay

## 2022-02-24 ENCOUNTER — Other Ambulatory Visit (HOSPITAL_COMMUNITY): Payer: Self-pay

## 2022-02-25 ENCOUNTER — Other Ambulatory Visit (HOSPITAL_COMMUNITY): Payer: Self-pay

## 2022-03-07 DIAGNOSIS — J449 Chronic obstructive pulmonary disease, unspecified: Secondary | ICD-10-CM | POA: Diagnosis not present

## 2022-03-25 DIAGNOSIS — M4726 Other spondylosis with radiculopathy, lumbar region: Secondary | ICD-10-CM | POA: Diagnosis not present

## 2022-03-25 DIAGNOSIS — M4722 Other spondylosis with radiculopathy, cervical region: Secondary | ICD-10-CM | POA: Diagnosis not present

## 2022-03-25 DIAGNOSIS — G894 Chronic pain syndrome: Secondary | ICD-10-CM | POA: Diagnosis not present

## 2022-03-25 DIAGNOSIS — Z79891 Long term (current) use of opiate analgesic: Secondary | ICD-10-CM | POA: Diagnosis not present

## 2022-04-07 DIAGNOSIS — J449 Chronic obstructive pulmonary disease, unspecified: Secondary | ICD-10-CM | POA: Diagnosis not present

## 2022-05-06 DIAGNOSIS — J449 Chronic obstructive pulmonary disease, unspecified: Secondary | ICD-10-CM | POA: Diagnosis not present

## 2022-05-28 DIAGNOSIS — M4726 Other spondylosis with radiculopathy, lumbar region: Secondary | ICD-10-CM | POA: Diagnosis not present

## 2022-05-28 DIAGNOSIS — M4722 Other spondylosis with radiculopathy, cervical region: Secondary | ICD-10-CM | POA: Diagnosis not present

## 2022-05-28 DIAGNOSIS — Z79891 Long term (current) use of opiate analgesic: Secondary | ICD-10-CM | POA: Diagnosis not present

## 2022-05-28 DIAGNOSIS — G894 Chronic pain syndrome: Secondary | ICD-10-CM | POA: Diagnosis not present

## 2022-06-06 DIAGNOSIS — J449 Chronic obstructive pulmonary disease, unspecified: Secondary | ICD-10-CM | POA: Diagnosis not present

## 2022-06-08 ENCOUNTER — Encounter (HOSPITAL_COMMUNITY): Payer: Self-pay

## 2022-06-08 ENCOUNTER — Emergency Department (HOSPITAL_COMMUNITY): Payer: Medicare Other

## 2022-06-08 ENCOUNTER — Inpatient Hospital Stay (HOSPITAL_COMMUNITY)
Admission: EM | Admit: 2022-06-08 | Discharge: 2022-06-10 | DRG: 103 | Disposition: A | Discharge status: Home / Self Care | Payer: Medicare Other | Attending: Neurology | Admitting: Neurology

## 2022-06-08 ENCOUNTER — Other Ambulatory Visit: Payer: Self-pay

## 2022-06-08 DIAGNOSIS — G43409 Hemiplegic migraine, not intractable, without status migrainosus: Secondary | ICD-10-CM | POA: Diagnosis not present

## 2022-06-08 DIAGNOSIS — H548 Legal blindness, as defined in USA: Secondary | ICD-10-CM | POA: Diagnosis not present

## 2022-06-08 DIAGNOSIS — Z96653 Presence of artificial knee joint, bilateral: Secondary | ICD-10-CM | POA: Diagnosis not present

## 2022-06-08 DIAGNOSIS — G629 Polyneuropathy, unspecified: Secondary | ICD-10-CM | POA: Diagnosis present

## 2022-06-08 DIAGNOSIS — Z880 Allergy status to penicillin: Secondary | ICD-10-CM | POA: Diagnosis not present

## 2022-06-08 DIAGNOSIS — G8929 Other chronic pain: Secondary | ICD-10-CM | POA: Diagnosis not present

## 2022-06-08 DIAGNOSIS — J9601 Acute respiratory failure with hypoxia: Secondary | ICD-10-CM | POA: Diagnosis not present

## 2022-06-08 DIAGNOSIS — Z79899 Other long term (current) drug therapy: Secondary | ICD-10-CM | POA: Diagnosis not present

## 2022-06-08 DIAGNOSIS — M069 Rheumatoid arthritis, unspecified: Secondary | ICD-10-CM | POA: Diagnosis present

## 2022-06-08 DIAGNOSIS — Z87891 Personal history of nicotine dependence: Secondary | ICD-10-CM | POA: Diagnosis not present

## 2022-06-08 DIAGNOSIS — I6523 Occlusion and stenosis of bilateral carotid arteries: Secondary | ICD-10-CM | POA: Diagnosis not present

## 2022-06-08 DIAGNOSIS — Z8673 Personal history of transient ischemic attack (TIA), and cerebral infarction without residual deficits: Secondary | ICD-10-CM

## 2022-06-08 DIAGNOSIS — Z91048 Other nonmedicinal substance allergy status: Secondary | ICD-10-CM | POA: Diagnosis not present

## 2022-06-08 DIAGNOSIS — Z9981 Dependence on supplemental oxygen: Secondary | ICD-10-CM | POA: Diagnosis not present

## 2022-06-08 DIAGNOSIS — Z881 Allergy status to other antibiotic agents status: Secondary | ICD-10-CM | POA: Diagnosis not present

## 2022-06-08 DIAGNOSIS — Z85828 Personal history of other malignant neoplasm of skin: Secondary | ICD-10-CM | POA: Diagnosis not present

## 2022-06-08 DIAGNOSIS — R531 Weakness: Secondary | ICD-10-CM | POA: Diagnosis not present

## 2022-06-08 DIAGNOSIS — G8191 Hemiplegia, unspecified affecting right dominant side: Secondary | ICD-10-CM | POA: Diagnosis present

## 2022-06-08 DIAGNOSIS — E119 Type 2 diabetes mellitus without complications: Secondary | ICD-10-CM | POA: Diagnosis not present

## 2022-06-08 DIAGNOSIS — Z885 Allergy status to narcotic agent status: Secondary | ICD-10-CM

## 2022-06-08 DIAGNOSIS — Z8616 Personal history of COVID-19: Secondary | ICD-10-CM | POA: Diagnosis not present

## 2022-06-08 DIAGNOSIS — I639 Cerebral infarction, unspecified: Secondary | ICD-10-CM | POA: Diagnosis not present

## 2022-06-08 DIAGNOSIS — I10 Essential (primary) hypertension: Secondary | ICD-10-CM | POA: Diagnosis present

## 2022-06-08 DIAGNOSIS — E785 Hyperlipidemia, unspecified: Secondary | ICD-10-CM | POA: Diagnosis present

## 2022-06-08 DIAGNOSIS — R4182 Altered mental status, unspecified: Secondary | ICD-10-CM | POA: Diagnosis not present

## 2022-06-08 DIAGNOSIS — R29818 Other symptoms and signs involving the nervous system: Secondary | ICD-10-CM | POA: Diagnosis not present

## 2022-06-08 DIAGNOSIS — I6389 Other cerebral infarction: Secondary | ICD-10-CM | POA: Diagnosis not present

## 2022-06-08 DIAGNOSIS — R2990 Unspecified symptoms and signs involving the nervous system: Secondary | ICD-10-CM | POA: Diagnosis not present

## 2022-06-08 DIAGNOSIS — E7800 Pure hypercholesterolemia, unspecified: Secondary | ICD-10-CM | POA: Diagnosis not present

## 2022-06-08 DIAGNOSIS — M542 Cervicalgia: Secondary | ICD-10-CM | POA: Diagnosis present

## 2022-06-08 DIAGNOSIS — M549 Dorsalgia, unspecified: Secondary | ICD-10-CM | POA: Diagnosis present

## 2022-06-08 LAB — CBC
HCT: 48.8 % (ref 39.0–52.0)
Hemoglobin: 15.8 g/dL (ref 13.0–17.0)
MCH: 33.3 pg (ref 26.0–34.0)
MCHC: 32.4 g/dL (ref 30.0–36.0)
MCV: 102.7 fL — ABNORMAL HIGH (ref 80.0–100.0)
Platelets: 106 10*3/uL — ABNORMAL LOW (ref 150–400)
RBC: 4.75 MIL/uL (ref 4.22–5.81)
RDW: 11.9 % (ref 11.5–15.5)
WBC: 6.2 10*3/uL (ref 4.0–10.5)
nRBC: 0 % (ref 0.0–0.2)

## 2022-06-08 LAB — DIFFERENTIAL
Abs Immature Granulocytes: 0.01 10*3/uL (ref 0.00–0.07)
Basophils Absolute: 0 10*3/uL (ref 0.0–0.1)
Basophils Relative: 1 %
Eosinophils Absolute: 0.2 10*3/uL (ref 0.0–0.5)
Eosinophils Relative: 3 %
Immature Granulocytes: 0 %
Lymphocytes Relative: 30 %
Lymphs Abs: 1.9 10*3/uL (ref 0.7–4.0)
Monocytes Absolute: 0.8 10*3/uL (ref 0.1–1.0)
Monocytes Relative: 13 %
Neutro Abs: 3.3 10*3/uL (ref 1.7–7.7)
Neutrophils Relative %: 53 %

## 2022-06-08 LAB — COMPREHENSIVE METABOLIC PANEL
ALT: 11 U/L (ref 0–44)
AST: 17 U/L (ref 15–41)
Albumin: 3.7 g/dL (ref 3.5–5.0)
Alkaline Phosphatase: 53 U/L (ref 38–126)
Anion gap: 10 (ref 5–15)
BUN: 18 mg/dL (ref 8–23)
CO2: 35 mmol/L — ABNORMAL HIGH (ref 22–32)
Calcium: 8.5 mg/dL — ABNORMAL LOW (ref 8.9–10.3)
Chloride: 92 mmol/L — ABNORMAL LOW (ref 98–111)
Creatinine, Ser: 0.98 mg/dL (ref 0.61–1.24)
GFR, Estimated: >60 mL/min (ref >60)
Glucose, Bld: 150 mg/dL — ABNORMAL HIGH (ref 70–99)
Potassium: 3.8 mmol/L (ref 3.5–5.1)
Sodium: 137 mmol/L (ref 135–145)
Total Bilirubin: 0.9 mg/dL (ref 0.3–1.2)
Total Protein: 6.6 g/dL (ref 6.5–8.1)

## 2022-06-08 LAB — I-STAT CHEM 8, ED
BUN: 18 mg/dL (ref 8–23)
Calcium, Ion: 0.95 mmol/L — ABNORMAL LOW (ref 1.15–1.40)
Chloride: 93 mmol/L — ABNORMAL LOW (ref 98–111)
Creatinine, Ser: 1 mg/dL (ref 0.61–1.24)
Glucose, Bld: 146 mg/dL — ABNORMAL HIGH (ref 70–99)
HCT: 49 % (ref 39.0–52.0)
Hemoglobin: 16.7 g/dL (ref 13.0–17.0)
Potassium: 3.8 mmol/L (ref 3.5–5.1)
Sodium: 136 mmol/L (ref 135–145)
TCO2: 36 mmol/L — ABNORMAL HIGH (ref 22–32)

## 2022-06-08 LAB — BLOOD GAS, VENOUS
Acid-Base Excess: 11.4 mmol/L — ABNORMAL HIGH (ref 0.0–2.0)
Bicarbonate: 42.3 mmol/L — ABNORMAL HIGH (ref 20.0–28.0)
Drawn by: 66297
O2 Saturation: 88.5 %
Patient temperature: 36.5
pCO2, Ven: 82 mmHg (ref 44–60)
pH, Ven: 7.32 (ref 7.25–7.43)
pO2, Ven: 52 mmHg — ABNORMAL HIGH (ref 32–45)

## 2022-06-08 LAB — PROTIME-INR
INR: 1 (ref 0.8–1.2)
Prothrombin Time: 13.4 seconds (ref 11.4–15.2)

## 2022-06-08 LAB — ETHANOL: Alcohol, Ethyl (B): <10 mg/dL (ref <10)

## 2022-06-08 LAB — APTT: aPTT: 25 seconds (ref 24–36)

## 2022-06-08 LAB — CBG MONITORING, ED: Glucose-Capillary: 160 mg/dL — ABNORMAL HIGH (ref 70–99)

## 2022-06-08 LAB — MRSA NEXT GEN BY PCR, NASAL: MRSA by PCR Next Gen: NOT DETECTED

## 2022-06-08 LAB — MAGNESIUM: Magnesium: 1.6 mg/dL — ABNORMAL LOW (ref 1.7–2.4)

## 2022-06-08 LAB — HEMOGLOBIN A1C
Hgb A1c MFr Bld: 5.7 % — ABNORMAL HIGH (ref 4.8–5.6)
Mean Plasma Glucose: 116.89 mg/dL

## 2022-06-08 MED ORDER — SENNOSIDES-DOCUSATE SODIUM 8.6-50 MG PO TABS: 1.0000 | ORAL_TABLET | Freq: Every evening | ORAL | Status: DC | PRN

## 2022-06-08 MED ORDER — TENECTEPLASE FOR STROKE
0.2500 mg/kg | PACK | Freq: Once | INTRAVENOUS | Status: DC
  Filled 2022-06-08: qty 10

## 2022-06-08 MED ORDER — STROKE: EARLY STAGES OF RECOVERY BOOK
Freq: Once | Status: AC
  Filled 2022-06-08: qty 1

## 2022-06-08 MED ORDER — PANTOPRAZOLE SODIUM 40 MG IV SOLR: 40.0000 mg | Freq: Every day | INTRAVENOUS | Status: DC

## 2022-06-08 MED ORDER — ACETAMINOPHEN 325 MG PO TABS
650.0000 mg | ORAL_TABLET | ORAL | Status: DC | PRN
  Administered 2022-06-09 (×2): 650 mg via ORAL
  Filled 2022-06-08 (×2): qty 2

## 2022-06-08 MED ORDER — IOHEXOL 350 MG/ML SOLN
75.0000 mL | Freq: Once | INTRAVENOUS | Status: AC | PRN
  Administered 2022-06-08: 75 mL via INTRAVENOUS

## 2022-06-08 MED ORDER — ACETAMINOPHEN 650 MG RE SUPP: 650.0000 mg | RECTAL | Status: DC | PRN

## 2022-06-08 MED ORDER — TENECTEPLASE FOR STROKE
0.2500 mg/kg | PACK | Freq: Once | INTRAVENOUS | Status: AC
  Administered 2022-06-08: 24 mg via INTRAVENOUS

## 2022-06-08 MED ORDER — ACETAMINOPHEN 160 MG/5ML PO SOLN: 650.0000 mg | ORAL | Status: DC | PRN

## 2022-06-08 MED ORDER — SODIUM CHLORIDE 0.9 % IV SOLN: INTRAVENOUS | Status: DC

## 2022-06-08 NOTE — ED Provider Notes (Signed)
[place] EMERGENCY DEPARTMENT AT [facility] Provider Note   CSN: [id number] Arrival date & time: 06/08/22  1430     History {Add pertinent medical, surgical, social history, OB history to HPI:1} Chief Complaint  Patient presents with   Altered Mental Status    Douglas [name] is a 72 y.o. male.  Patient started with weakness to his right side.  When he arrived in the emergency department Dr. Durwin Nora saw him and called a code stroke.   Altered Mental Status      Home Medications Prior to Admission medications   Medication Sig Start Date End Date Taking? Authorizing Provider  atorvastatin (LIPITOR) 40 MG tablet Take by mouth. 05/30/21   [provider]  Cholecalciferol (VITAMIN D-3) 5000 UNIT/ML LIQD Take by mouth. 11/25/16   [provider]  famotidine (PEPCID) 20 MG tablet Take 20 mg by mouth 2 (two) times daily. 09/08/21   [provider]  HYDROcodone-acetaminophen (NORCO) 10-325 MG tablet Take by mouth. 09/29/21   [provider]  HYDROcodone-acetaminophen (NORCO) 10-325 MG tablet Take 1 tablet by mouth every four hours as needed for pain 12/15/21     HYDROcodone-acetaminophen (NORCO) 10-325 MG tablet Take 1 tablet by mouth every 4 (four) hours as needed for pain 01/19/22     HYDROcodone-acetaminophen (NORCO) 10-325 MG tablet Take 1 tablet by mouth every 4 (four) hours as needed for pain 02/17/22     HYDROcodone-acetaminophen (NORCO) 10-325 MG tablet Take 1 tablet by mouth every 4 (four) hours as needed for pain 03/18/22     latanoprost (XALATAN) 0.005 % ophthalmic solution Place 1 drop into both eyes at bedtime. 12/01/19   [provider]  lisinopril-hydrochlorothiazide (ZESTORETIC) 10-12.5 MG tablet Take 1 tablet by mouth daily. 08/27/21   [provider]  pregabalin (LYRICA) 150 MG capsule Take 150 mg by mouth 2 (two) times daily. 01/15/20   [provider]  pregabalin (LYRICA) 150 MG capsule Take 1  capsule (150 mg total) by mouth 3 (three) times daily. 01/28/22     sulfaSALAzine (AZULFIDINE) 500 MG tablet Take 1,000 mg by mouth 2 (two) times daily. 12/12/19   [provider]  sulfaSALAzine (AZULFIDINE) 500 MG tablet Take 4 tablets (2,000 mg total) by mouth 2 (two) times daily as directed 01/28/22     timolol (TIMOPTIC) 0.5 % ophthalmic solution Place 1 drop into both eyes daily. 02/12/20   [provider]      Allergies    Cefazolin, Morphine sulfate, Penicillins, Hydrochlorothiazide, Other, Oxycodone, and Vancomycin    Review of Systems   Review of Systems  Physical Exam Updated Vital Signs BP 130/80   Pulse 80   Temp 98.2 F (36.8 C)   Resp 18   Ht  (1.88 m)   Wt 97.1 kg   SpO2 96%   BMI 27.48 kg/m  Physical Exam  ED Results / Procedures / Treatments   Labs (all labs ordered are listed, but only abnormal results are displayed) Labs Reviewed  CBC - Abnormal; Notable for the following components:      Result Value   MCV 102.7 (*)    Platelets 106 (*)    All other components within normal limits  COMPREHENSIVE METABOLIC PANEL - Abnormal; Notable for the following components:   Chloride 92 (*)    CO2 35 (*)    Glucose, Bld 150 (*)    Calcium 8.5 (*)    All other components within normal limits  BLOOD GAS, VENOUS -  Abnormal; Notable for the following components:   pCO2, Ven 82 (*)    pO2, Ven 52 (*)    Bicarbonate 42.3 (*)    Acid-Base Excess 11.4 (*)    All other components within normal limits  MAGNESIUM - Abnormal; Notable for the following components:   Magnesium 1.6 (*)    All other components within normal limits  CBG MONITORING, ED - Abnormal; Notable for the following components:   Glucose-Capillary 160 (*)    All other components within normal limits  I-STAT CHEM 8, ED - Abnormal; Notable for the following components:   Chloride 93 (*)    Glucose, Bld 146 (*)    Calcium, Ion 0.95 (*)    TCO2 36 (*)    All other components  within normal limits  PROTIME-INR  APTT  DIFFERENTIAL  ETHANOL  RAPID URINE DRUG SCREEN, HOSP PERFORMED  URINALYSIS, ROUTINE W REFLEX MICROSCOPIC    EKG None  Radiology CT ANGIO HEAD NECK W WO CM (CODE STROKE)  Result Date: 06/08/2022 CLINICAL DATA:  Provided history: Neuro deficit, acute, stroke suspected. Right-sided weakness. Additional history provided: Right-sided weakness (leg greater than arm). EXAM: CT ANGIOGRAPHY HEAD AND NECK WITH AND WITHOUT CONTRAST TECHNIQUE: Multidetector CT imaging of the head and neck was performed using the standard protocol during bolus administration of intravenous contrast. Multiplanar CT image reconstructions and MIPs were obtained to evaluate the vascular anatomy. Carotid stenosis measurements (when applicable) are obtained utilizing NASCET criteria, using the distal internal carotid diameter as the denominator. RADIATION DOSE REDUCTION: This exam was performed according to the departmental dose-optimization program which includes automated exposure control, adjustment of the mA and/or kV according to patient size and/or use of iterative reconstruction technique. CONTRAST:  75mL OMNIPAQUE IOHEXOL 350 MG/ML SOLN COMPARISON:  Non-contrast head CT performed earlier today 06/08/2022. FINDINGS: CTA NECK FINDINGS Aortic arch: Standard aortic branching. Atherosclerotic plaque within the visualized aortic arch and proximal major branch vessels of the neck. Streak and beam hardening artifact arising from a dense left-sided contrast bolus partially obscures the left subclavian artery. Within this limitation, there is no appreciable hemodynamically significant innominate or proximal subclavian artery stenosis. Right carotid system: CCA and ICA patent within the neck. Atherosclerotic plaque. Most notably, atherosclerotic plaque mild the carotid bifurcation and within the proximal ICA results in 60% stenosis origin of the right ICA. Tortuosity of the cervical ICA. Left  carotid system: CCA and ICA patent within the neck without measurable stenosis. Scattered atherosclerotic plaque within the CCA, the carotid bifurcation and within the proximal ICA. Tortuosity of the proximal ICA. Vertebral arteries: Codominant patent within the neck without hemodynamically significant stenosis. Nonstenotic atherosclerotic plaque within both vessels. Skeleton: No acute fracture or aggressive osseous lesion. Cervical spondylosis. Multilevel spinal canal stenosis. Most notably at C4-C5, C5-C6 and C6-C7, posterior disc osteophyte complexes contribute to at least moderate spinal canal stenosis. Multilevel bony neural foraminal narrowing. Other neck: No neck mass or cervical lymphadenopathy. Upper chest: Bandlike opacity within the inferior aspect of the right upper lobe. Mild atelectasis within imaged portions of the superior aspect of the right lower lobe. Review of the MIP images confirms the above findings CTA HEAD FINDINGS Anterior circulation: The intracranial internal carotid arteries are patent. Atherosclerotic plaque within both vessels with no more than mild stenosis. The M1 middle cerebral arteries are patent. No M2 proximal branch occlusion or high-grade proximal stenosis. The anterior cerebral arteries are patent. No intracranial aneurysm is identified. Posterior circulation: The intracranial vertebral arteries are patent. The basilar  artery is patent. The posterior cerebral arteries are patent. A right posterior communicating artery is present. Posterior communicating arteries are present, bilaterally. Venous sinuses: Within the limitations of contrast timing, no convincing thrombus. Anatomic variants: As described. Review of the MIP images confirms the above findings No emergent large vessel occlusion identified. These results were called by telephone at the time of interpretation on 06/08/2022 at 3:56 pm to provider [facility] , who verbally acknowledged these results. IMPRESSION: CTA  neck: 1. The common carotid and internal carotid arteries are patent within the neck. Atherosclerotic plaque bilaterally, as described. Most notably, atherosclerotic plaque results in 60% stenosis at the origin of the right ICA. 2. Vertebral arteries patent within the neck without hemodynamically significant stenosis. Non-stenotic atherosclerotic plaque bilaterally. 3.  Aortic Atherosclerosis (ICD10-I70.0). 4. Cervical spondylosis, as described. Posterior disc osteophyte complexes contribute to at least moderate spinal canal stenosis at C4-C5, C5-C6 and C6-C7. Multilevel bony neural foraminal narrowing. 5. Bandlike pulmonary opacity within the inferior aspect of the right upper lobe. This may reflect atelectasis and/or scarring. However, correlate clinically to exclude signs/symptoms that would suggestive pneumonia. CTA head: 1. No intracranial large vessel occlusion or proximal high-grade arterial stenosis identified. 2. Atherosclerotic plaque within the intracranial internal carotid arteries with no more than mild stenosis. Electronically Signed   By: Jackey Loge D.O.   On: 06/08/2022 16:25   CT HEAD CODE STROKE WO CONTRAST  Result Date: 06/08/2022 CLINICAL DATA:  Code stroke.  Neuro deficit, acute, stroke suspected EXAM: CT HEAD WITHOUT CONTRAST TECHNIQUE: Contiguous axial images were obtained from the base of the skull through the vertex without intravenous contrast. RADIATION DOSE REDUCTION: This exam was performed according to the departmental dose-optimization program which includes automated exposure control, adjustment of the mA and/or kV according to patient size and/or use of iterative reconstruction technique. COMPARISON:  None Available. FINDINGS: Brain: Remote right cerebellar infarct no evidence of acute large vascular territory infarct, acute hemorrhage, mass lesion or midline shift. Patchy white matter hypodensities, nonspecific but compatible with chronic microvascular ischemic change  Vascular: No hyperdense vessel. Skull: No acute fracture. Sinuses/Orbits: Clear sinuses.  No acute orbital findings. ASPECTS [facility] Stroke Program Early CT Score) total score (0-10 with 10 being normal): 10. IMPRESSION: 1. No evidence of acute large vascular territory infarct or acute hemorrhage. ASPECTS is 10. 2. 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 Findings discussed with Dr. Durwin Nora via telephone at 3:30 p.m. Electronically Signed   By: Feliberto Harts M.D.   On: 06/08/2022 15:33    Procedures Procedures  {Document cardiac monitor, telemetry assessment procedure when appropriate:1}  Medications Ordered in ED Medications  iohexol (OMNIPAQUE) 350 MG/ML injection 75 mL (75 mLs Intravenous Contrast Given 06/08/22 1551)  tenecteplase (TNKASE) injection for Stroke 24 mg (24 mg Intravenous Given 06/08/22 1546)    ED Course/ Medical Decision Making/ A&P   {  CRITICAL CARE Performed by: Bethann Berkshire Total critical care time: 45 minutes Critical care time was exclusive of separately billable procedures and treating other patients. Critical care was necessary to treat or prevent imminent or life-threatening deterioration. Critical care was time spent personally by me on the following activities: development of treatment plan with patient and/or surrogate as well as nursing, discussions with consultants, evaluation of patient's response to treatment, examination of patient, obtaining history from patient or surrogate, ordering and performing treatments and interventions, ordering and review of laboratory studies, ordering and review of radiographic studies, pulse oximetry and re-evaluation of patient's condition.  Patient with acute stroke.  He was seen by Dr. Selina Cooley neurology and she gave TNK.  Patient improved after the TNK and could lift his arm on the right side and leg.  Patient has been admitted to [facility], ICU under the neurology service. Click here  for ABCD2, HEART and other calculatorsREFRESH Note before signing :1}                          Medical Decision Making Risk Decision regarding hospitalization.   Acute stroke with admission to [facility] after TNK administered  {Document critical care time when appropriate:1} {Document review of labs and clinical decision tools ie heart score, Chads2Vasc2 etc:1}  {Document your independent review of radiology images, and any outside records:1} {Document your discussion with family members, caretakers, and with consultants:1} {Document social determinants of health affecting pt's care:1} {Document your decision making why or why not admission, treatments were needed:1} Final Clinical Impression(s) / ED Diagnoses Final diagnoses:  Acute stroke due to ischemia    Rx / DC Orders ED Discharge Orders     None

## 2022-06-08 NOTE — H&P (Shared)
Neurology H&P  CC: Right-sided weakness and speech difficulty  History is obtained from: Patient, chart review  HPI: Douglas [name] is a 72 y.o. 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  He is also observed to have right sided weakness in the face, arm and leg by wife and by televideo examination.  He denies ever having had similar symptoms, notes he has been having increased headache for the past few weeks which she has attributed to pollen.  His headache is worse at night which he attributes to him needing oxygen since he had COVID-19 many months ago.  He denies any new recent changes in his medications.  He notes he is feeling much better at the time of my interview with him then when he had received thrombolytic  LKW: 1310  Thrombolytic given?:  1546 at [facility] per Dr. Selina Cooley via video neurology IA performed?: No, no LVO Premorbid modified rankin scale:      3 - Moderate disability. Requires some help, but able to walk unassisted.  ROS: All other review of systems was negative except as noted in the HPI.   Past Medical History:  Diagnosis Date   Basal cell carcinoma 01/24/2014   left temple tx mohs Dr Alean Rinne 03/12/2014   Chronic pain    Hypertension    Past Surgical History:  Procedure Laterality Date   BACK SURGERY     x 3   REPLACEMENT TOTAL KNEE BILATERAL     TOTAL HIP ARTHROPLASTY     bilateral   History reviewed. No pertinent family  history.  Social History:  reports that he has quit smoking. He has never used smokeless tobacco. He reports that he does not drink alcohol and does not use drugs.   Exam: Current vital signs: BP (!) 152/80 (BP Location: Right Arm)   Pulse 84   Temp 98.2 F (36.8 C)   Resp 18   Ht  (1.88 m)   Wt 97.1 kg   SpO2 94%   BMI 27.48 kg/m  Vital signs in last 24 hours: Temp:  [98 F (36.7 C)-98.2 F (36.8 C)] 98.2 F (36.8 C) (04/22 1731) Pulse Rate:  [80-84] 84 (04/22 1900) Resp:  [16-24] 18 (04/22 1900) BP: (118-152)/(69-87) 152/80 (04/22 1900) SpO2:  [90 %-96 %] 94 % (04/22 1900) Weight:  [77.1 kg-97.1 kg] 97.1 kg (04/22 1540)   Physical Exam  Constitutional: No acute distress Psych: Affect appropriate to situation, calm and cooperative Eyes: No scleral injection HENT: No oropharyngeal obstruction.  MSK: Rheumatic changes of the bilateral hands. Cardiovascular: Normal rate and regular rhythm. Perfusing extremities well Respiratory: Effort normal, non-labored breathing, nasal cannula in place GI: Soft.  No distension. There is no tenderness.  Skin: Chronic skin changes of the bilateral lower extremities  Neuro: Mental Status: Patient is awake, alert, oriented to person, place, month, year, and situation. Patient is able to give a clear and coherent history. No signs of aphasia or neglect Cranial Nerves: II: Visual Fields are full in  the right eye, light perception in the left eye.  Left eye afferent pupillary defect III,IV, VI: EOMI  V: Facial sensation is symmetric to light touch VII: Facial movement is notable for slight right facial droop VIII: hearing is intact to voice X: Uvula elevates symmetrically XI: Shoulder shrug is symmetric. XII: tongue is midline without atrophy or fasciculations.  Motor: Tone is normal. Bulk is normal.  Confrontational strength testing limited by pain and chronic arthritis.  He is unable to fully supinate his hands, but does not  have any significant pronator drift within the limits of his baseline range of motion.  Does have some slight drift of the right leg although he reports he feels that he is now at his baseline Sensory: Sensation is symmetric to light touch in the arms and legs, with patient noting poor sensation in the bilateral distal lower extremities Deep Tendon Reflexes: Diminished bilateral patellar's, 2+ and symmetric bilateral brachioradialis Cerebellar: FNF and HKS are intact bilaterally within limits of weakness in lower extremities Gait:  Deferred for patient safety  NIHSS total 2 Score breakdown: 1 point for slight right facial droop, 1 point for right leg weakness Performed at 11:45 PM   I have reviewed labs in epic and the results pertinent to this consultation are:  Basic Metabolic Panel: Recent Labs  Lab 06/08/22 1504 06/08/22 1512  NA 137 136  K 3.8 3.8  CL 92* 93*  CO2 35*  --   GLUCOSE 150* 146*  BUN 18 18  CREATININE 0.98 1.00  CALCIUM 8.5*  --   MG 1.6*  --     CBC: Recent Labs  Lab 06/08/22 1504 06/08/22 1512  WBC 6.2  --   NEUTROABS 3.3  --   HGB 15.8 16.7  HCT 48.8 49.0  MCV 102.7*  --   PLT 106*  --     Coagulation Studies: Recent Labs    06/08/22 1504  LABPROT 13.4  INR 1.0      I have reviewed the images obtained:  CT head 1. No evidence of acute large vascular territory infarct or acute hemorrhage. ASPECTS is 10. 2. 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    CTA head 1. No intracranial large vessel occlusion or proximal high-grade arterial stenosis identified. 2. Atherosclerotic plaque within the intracranial internal carotid arteries with no more than mild stenosis.   CTA neck:  1. The common carotid and internal carotid arteries are patent within the neck. Atherosclerotic plaque bilaterally, as described. Most notably, atherosclerotic plaque results in 60% stenosis at the origin of the right  ICA. 2. Vertebral arteries patent within the neck without hemodynamically significant stenosis. Non-stenotic atherosclerotic plaque bilaterally. 3.  Aortic Atherosclerosis (ICD10-I70.0). 4. Cervical spondylosis, as described. Posterior disc osteophyte complexes contribute to at least moderate spinal canal stenosis at C4-C5, C5-C6 and C6-C7. Multilevel bony neural foraminal narrowing. 5. Bandlike pulmonary opacity within the inferior aspect of the right upper lobe. This may reflect atelectasis and/or scarring. However, correlate clinically to exclude signs/symptoms that would suggestive pneumonia.  Impression: Given patient's age and risk factors, clinical story is highly concerning for acute stroke.  However he does describe headache and vision changes which can be seen with migraine headache.  Recommendations: # Likely left MCA stroke vs. TIA, less likely complex migraine s/p TNK - Stroke labs HgbA1c, fasting lipid panel - MRI brain 24 hours post TNK - Frequent neuro checks - Echocardiogram - Hold ASA and chemical DVT prophylaxis for 24 hours until  post TNK head imaging completed  - Risk factor modification - Telemetry monitoring - Blood pressure goal   - Post TNK for 24  hours < 180/105, labetalol ordered as needed - PT consult, OT consult, Speech consult, unless patient is back to baseline - Stroke team to follow  #Chronic pain on chronic opiates -Tylenol for mild to moderate pain -Half home dose of Norco (5-325) for now given need to closely monitor neuro exam -Reduce dose to Lyrica 100 mg twice daily for now (patient reporting 150 mg 3 times daily home dose at this time, charted as 150 twice daily) -Hold sulfasalazine due to bleeding risk for now  #Hypertension -Hold home antihypertensives for now  #Hyperlipidemia -Continue atorvastatin 40 mg nightly for now, increase if needed for goal LDL less than 70  #Eyedrops -Patient reports discrepant dosing from chart (reporting  he is only using the medication in his right eye, with 1 drop of each medication twice a day); to be ordered after medication reconciliation confirmed given potential for residual aphasia/confusion due to stroke  Brooke Dare MD-PhD [facility] [phone] Available 7 PM to 7 AM, outside of these hours please call Neurologist on call as listed on Amion.   CRITICAL CARE Performed by: Gordy Councilman   Total critical care time: 40 minutes  Critical care time was exclusive of separately billable procedures and treating other patients.  Critical care was necessary to treat or prevent imminent or life-threatening deterioration.  Critical care was time spent personally by me on the following activities: development of treatment plan with patient and/or surrogate as well as nursing, discussions with consultants, evaluation of patient's response to treatment, examination of patient, obtaining history from patient or surrogate, ordering and performing treatments and interventions, ordering and review of laboratory studies, ordering and review of radiographic studies, pulse oximetry and re-evaluation of patient's condition.

## 2022-06-08 NOTE — Progress Notes (Signed)
[place] 1501  Dr. Selina Cooley paged [phone]  Per pts son at bedside, pt stated to his wife that he did not feel good and needed to go to the hospital at 1350 today. Around 1405 pt reported R facial numbness and R sided weakness to the point of not being able to ambulate as normal at home. Pt uses a cane normally at home, but is able to get around and go feed his horses every morning. LNW 1310 when pt let the dogs out.  Dr. Selina Cooley on camera 1510 Pt to CT 1520 Pt back from CT 1535  Timeout 1546 TNK bolus 1546

## 2022-06-08 NOTE — ED Provider Notes (Signed)
Patient presents for severe generalized weakness with an acute change this afternoon at approximately 2 PM.  At baseline, he is on 2 L supplemental oxygen.  He reportedly went to take his dogs out and when he walked back in the house, he stated that he does not feel right, he feels like he is having a stroke, and has to be taken to the hospital.  He was unable to stand unassisted.  He arrived without supplemental oxygen with SpO2 in the 70s.  He was placed on 4 L of supplemental oxygen with improvement in SpO2 to the low 90s.  On exam, patient has global weakness.  He has delayed and slurred speech.  He is companied by his son at bedside.  Son confirms that he was at his baseline prior to 2 PM.  He took his morning medications but did not take any medications prior to onset of symptoms.  Given the acute change, code stroke was initiated.  Remaining care of patient assumed by oncoming ED provider.   Gloris Manchester, MD 06/08/22 [phone]

## 2022-06-08 NOTE — Consult Note (Signed)
NEUROLOGY TELECONSULTATION NOTE   Date of service: June 08, 2022 Patient Name: Douglas [name] MRN:  [id number] DOB:  12-Feb-1951 Reason for consult: telestroke  Requesting Provider: Dr. Bethann Berkshire Consult Participants: myself, patient, bedside RN, telestroke RN Location of the provider: [facility] Location of the patient: [place]  This consult was provided via telemedicine with 2-way video and audio communication. The patient/family was informed that care would be provided in this way and agreed to receive care in this manner.   _ _ _   _ __   _ __ _ _  __ __   _ __   __ _  History of Present Illness   This is a 72 yo gentleman with hx HTN who presents with acute onset of R sided weakness. At baseline patient has limited ability to ambulate 2/2 chronic pain, able to ambulate some with a cane, but has no cognitive deficits. LKW 1310. He is not on anticoagulation. He let the dogs outside and when he came back he became pale and told his wife he felt like he was going to pass out and thought he was having a stroke. He has weakness of his R face/arm/leg and is confused. NIHSS = 8. Personal review of head CT prior to TNK administration showed no acute process. Risks, benefits, and alternatives to TNK were discussed with wife who gave informed consent to proceed with TNK. Patient was unable to provide consent himself 2/2 confusion. TNK administered at 1546. CTA showed no LVO per personal review and discussion with radiologist.   ROS   UTA 2/2 AMS  Past History   The following was personally reviewed:  Past Medical History:  Diagnosis Date   Basal cell carcinoma 01/24/2014   left temple tx mohs Dr Alean Rinne 03/12/2014   Chronic pain    Hypertension    Past Surgical History:  Procedure Laterality Date   BACK SURGERY     x 3   REPLACEMENT TOTAL KNEE BILATERAL     TOTAL HIP ARTHROPLASTY     bilateral   History reviewed. No pertinent family history. Social History   Socioeconomic  History   Marital status: Married    Spouse name: Not on file   Number of children: Not on file   Years of education: Not on file   Highest education level: Not on file  Occupational History   Not on file  Tobacco Use   Smoking status: Former   Smokeless tobacco: Never  Substance and Sexual Activity   Alcohol use: No   Drug use: No   Sexual activity: Not on file  Other Topics Concern   Not on file  Social History Narrative   Not on file   Social Determinants of Health   Financial Resource Strain: Not on file  Food Insecurity: Not on file  Transportation Needs: Not on file  Physical Activity: Not on file  Stress: Not on file  Social Connections: Not on file   Allergies  Allergen Reactions   Cefazolin Rash    See 08/2012 admission    Morphine Sulfate    Penicillins Hives   Hydrochlorothiazide Rash   Other Rash    Tide detergent   Oxycodone Rash   Vancomycin Rash    Drug rash     Medications   (Not in a hospital admission)     Current Facility-Administered Medications:    iohexol (OMNIPAQUE) 350 MG/ML injection 75 mL, 75 mL, Intravenous, Once PRN, Gloris Manchester, MD  Current Outpatient Medications:  atorvastatin (LIPITOR) 40 MG tablet, Take by mouth., Disp: , Rfl:    Cholecalciferol (VITAMIN D-3) 5000 UNIT/ML LIQD, Take by mouth., Disp: , Rfl:    famotidine (PEPCID) 20 MG tablet, Take 20 mg by mouth 2 (two) times daily., Disp: , Rfl:    HYDROcodone-acetaminophen (NORCO) 10-325 MG tablet, Take by mouth., Disp: , Rfl:    HYDROcodone-acetaminophen (NORCO) 10-325 MG tablet, Take 1 tablet by mouth every four hours as needed for pain, Disp: 180 tablet, Rfl: 0   HYDROcodone-acetaminophen (NORCO) 10-325 MG tablet, Take 1 tablet by mouth every 4 (four) hours as needed for pain, Disp: 180 tablet, Rfl: 0   HYDROcodone-acetaminophen (NORCO) 10-325 MG tablet, Take 1 tablet by mouth every 4 (four) hours as needed for pain, Disp: 180 tablet, Rfl: 0    HYDROcodone-acetaminophen (NORCO) 10-325 MG tablet, Take 1 tablet by mouth every 4 (four) hours as needed for pain, Disp: 180 tablet, Rfl: 0   latanoprost (XALATAN) 0.005 % ophthalmic solution, Place 1 drop into both eyes at bedtime., Disp: , Rfl:    lisinopril-hydrochlorothiazide (ZESTORETIC) 10-12.5 MG tablet, Take 1 tablet by mouth daily., Disp: , Rfl:    pregabalin (LYRICA) 150 MG capsule, Take 150 mg by mouth 2 (two) times daily., Disp: , Rfl:    pregabalin (LYRICA) 150 MG capsule, Take 1 capsule (150 mg total) by mouth 3 (three) times daily., Disp: 90 capsule, Rfl: 1   sulfaSALAzine (AZULFIDINE) 500 MG tablet, Take 1,000 mg by mouth 2 (two) times daily., Disp: , Rfl:    sulfaSALAzine (AZULFIDINE) 500 MG tablet, Take 4 tablets (2,000 mg total) by mouth 2 (two) times daily as directed, Disp: 240 tablet, Rfl: 2   timolol (TIMOPTIC) 0.5 % ophthalmic solution, Place 1 drop into both eyes daily., Disp: , Rfl:   Vitals   Vitals:   06/08/22 1443  Weight: 77.1 kg  Height:  (1.88 m)     Body mass index is 21.83 kg/m.  Physical Exam   Exam performed over telemedicine with 2-way video and audio communication and with assistance of bedside RN  Physical Exam Gen: A&O x4, NAD Resp: normal WOB CV: extremities appear well-perfused  Neuro: *MS: oriented to self and age, not month. Follows single step commands *Speech: mild dysarthria, no aphasia *CN: PERRL 3mm, EOMI, chronic blindness L eye, sensation intact, R lower facial droop, hearing intact to voice *Motor:   LUE no drift, RUE drift but not to bed, LLE no drift, RLE no movement against gravity *Sensory: SILT. Symmetric. No double-simultaneous extinction.  *Coordination:  FNF intact bilat *Reflexes:  UTA 2/2 tele-exam *Gait: deferred  NIHSS  NIHSS components Score: Comment  1a Level of Conscious 0[x]  1[]  2[]  3[]      1b LOC Questions 0[]  1[x]  2[]       1c LOC Commands 0[x]  1[]  2[]       2 Best Gaze 0[]  1[x]  2[]       3 Visual  0[]  1[]  2[x]  3[]      4 Facial Palsy 0[]  1[x]  2[]  3[]      5a Motor Arm - left 0[x]  1[]  2[]  3[]  4[]  UN[]    5b Motor Arm - Right 0[]  1[x]  2[]  3[]  4[]  UN[]    6a Motor Leg - Left 0[x]  1[]  2[]  3[]  4[]  UN[]    6b Motor Leg - Right 0[]  1[]  2[]  3[x]  4[]  UN[]    7 Limb Ataxia 0[x]  1[]  2[]  3[]  UN[]     8 Sensory 0[x]  1[]  2[]  UN[]      9 Best Language 0[x]  1[]   2[]  3[]      10 Dysarthria 0[]  1[x]  2[]  UN[]      11 Extinct. and Inattention 0[x]  1[]  2[]       TOTAL:  8     Premorbid mRS = 3   Labs   CBC:  Recent Labs  Lab 06/08/22 1504 06/08/22 1512  WBC 6.2  --   NEUTROABS 3.3  --   HGB 15.8 16.7  HCT 48.8 49.0  MCV 102.7*  --   PLT 106*  --     Basic Metabolic Panel:  Lab Results  Component Value Date   NA 136 06/08/2022   K 3.8 06/08/2022   CO2 23 02/27/2020   GLUCOSE 146 (H) 06/08/2022   BUN 18 06/08/2022   CREATININE 1.00 06/08/2022   CALCIUM 8.5 (L) 02/27/2020   GFRNONAA >60 02/27/2020   GFRAA 77 (L) 11/24/2011   Lipid Panel: No results found for: "LDLCALC" HgbA1c: No results found for: "HGBA1C" Urine Drug Screen: No results found for: "LABOPIA", "COCAINSCRNUR", "LABBENZ", "AMPHETMU", "THCU", "LABBARB"  Alcohol Level No results found for: "ETH"  CT Head without contrast: No acute process, personal review  Impression   This is a 72 yo gentleman with hx HTN who presents with acute onset of R sided weakness c/f acute ischemic stroke, now s/p TNK. He will need to be transferred to [facility] neuro ICU for close monitoring post TNK and further mgmt.   Recommendations   - Admit to 4N neuro ICU under Dr. Derry Lory. I will put in bed  - Neurochecks and NIHSS documentation per post-TNK protocol - STAT head CT for any change in neurologic exam - Non-con head CT 24 hrs post-TNK r/o hemorrhagic conversion - MRI brain wo contrast - CTA already performed - TTE - no aspirin for 24 hours post TNK and until ICH ruled out by repeat head CT - keep SBP less than 180/105 for the 1st 24 hours  post TNK to reduce the risk of hemorrhagic transformation - SCDs for DVT prophylaxis; can start SQ Heparin if head CT 24 hours post TNK is negative for ICH - NPO; swallow study - PT/OT and speech therapy - stroke education - outpatient f/u with neurology after discharge  This patient is critically ill and at significant risk of neurological worsening, death and care requires constant monitoring of vital signs, hemodynamics,respiratory and cardiac monitoring, neurological assessment, discussion with family, other specialists and medical decision making of high complexity. I spent 65 minutes of neurocritical care time  in the care of  this patient. This was time spent independent of any time provided by nurse practitioner or PA.  Bing Neighbors, MD [facility] [phone]  If 7pm- 7am, please page neurology on call as listed in AMION.

## 2022-06-08 NOTE — ED Triage Notes (Addendum)
Pt family reports pt went to take his dogs out and when he came back in he said take me to the hospital and was unable to walk.  Pt is oriented x 4 in triage but O@ sats are 70 since family brought him in POV without his O2.  Pt reports he is feeling "tingly" all over.

## 2022-06-08 NOTE — ED Notes (Signed)
Ct 1525

## 2022-06-08 NOTE — ED Notes (Signed)
Code Stroke called  beeped CT and Lab.

## 2022-06-09 ENCOUNTER — Other Ambulatory Visit (HOSPITAL_COMMUNITY): Payer: Medicare Other

## 2022-06-09 ENCOUNTER — Inpatient Hospital Stay (HOSPITAL_COMMUNITY): Payer: Medicare Other

## 2022-06-09 DIAGNOSIS — I639 Cerebral infarction, unspecified: Secondary | ICD-10-CM | POA: Diagnosis not present

## 2022-06-09 LAB — LIPID PANEL
Cholesterol: 111 mg/dL (ref 0–200)
HDL: 35 mg/dL — ABNORMAL LOW (ref >40)
LDL Cholesterol: 55 mg/dL (ref 0–99)
Total CHOL/HDL Ratio: 3.2 RATIO
Triglycerides: 107 mg/dL (ref <150)
VLDL: 21 mg/dL (ref 0–40)

## 2022-06-09 MED ORDER — HYDROCODONE-ACETAMINOPHEN 5-325 MG PO TABS
1.0000 | ORAL_TABLET | ORAL | Status: DC | PRN
  Administered 2022-06-09: 1 via ORAL
  Filled 2022-06-09: qty 1

## 2022-06-09 MED ORDER — TIMOLOL MALEATE 0.5 % OP SOLN
1.0000 [drp] | Freq: Every day | OPHTHALMIC | Status: DC
  Administered 2022-06-09 – 2022-06-10 (×2): 1 [drp] via OPHTHALMIC
  Filled 2022-06-09: qty 5

## 2022-06-09 MED ORDER — PREGABALIN 25 MG PO CAPS
50.0000 mg | ORAL_CAPSULE | Freq: Once | ORAL | Status: AC
  Administered 2022-06-09: 50 mg via ORAL
  Filled 2022-06-09: qty 2

## 2022-06-09 MED ORDER — MAGNESIUM SULFATE 2 GM/50ML IV SOLN
2.0000 g | Freq: Once | INTRAVENOUS | Status: AC
  Administered 2022-06-09: 2 g via INTRAVENOUS
  Filled 2022-06-09: qty 50

## 2022-06-09 MED ORDER — ASPIRIN 81 MG PO TBEC
81.0000 mg | DELAYED_RELEASE_TABLET | Freq: Every day | ORAL | Status: DC
  Administered 2022-06-09 – 2022-06-10 (×2): 81 mg via ORAL
  Filled 2022-06-09 (×2): qty 1

## 2022-06-09 MED ORDER — HYDROCODONE-ACETAMINOPHEN 10-325 MG PO TABS
1.0000 | ORAL_TABLET | ORAL | Status: DC | PRN
  Administered 2022-06-09 – 2022-06-10 (×6): 1 via ORAL
  Filled 2022-06-09 (×6): qty 1

## 2022-06-09 MED ORDER — PANTOPRAZOLE SODIUM 40 MG PO TBEC
40.0000 mg | DELAYED_RELEASE_TABLET | Freq: Every day | ORAL | Status: DC
  Administered 2022-06-09 – 2022-06-10 (×2): 40 mg via ORAL
  Filled 2022-06-09 (×2): qty 1

## 2022-06-09 MED ORDER — SULFASALAZINE 500 MG PO TABS
1000.0000 mg | ORAL_TABLET | Freq: Two times a day (BID) | ORAL | Status: DC
  Administered 2022-06-09 – 2022-06-10 (×3): 1000 mg via ORAL
  Filled 2022-06-09 (×4): qty 2

## 2022-06-09 MED ORDER — ATORVASTATIN CALCIUM 40 MG PO TABS
40.0000 mg | ORAL_TABLET | Freq: Every day | ORAL | Status: DC
  Administered 2022-06-09: 40 mg via ORAL
  Filled 2022-06-09: qty 1

## 2022-06-09 MED ORDER — PREGABALIN 75 MG PO CAPS
100.0000 mg | ORAL_CAPSULE | Freq: Two times a day (BID) | ORAL | Status: DC
  Administered 2022-06-09 (×2): 100 mg via ORAL
  Filled 2022-06-09 (×2): qty 1

## 2022-06-09 MED ORDER — LABETALOL HCL 5 MG/ML IV SOLN: 5.0000 mg | INTRAVENOUS | Status: DC | PRN

## 2022-06-09 MED ORDER — SALINE SPRAY 0.65 % NA SOLN: 1.0000 | NASAL | Status: DC | PRN

## 2022-06-09 MED ORDER — CHLORHEXIDINE GLUCONATE CLOTH 2 % EX PADS
6.0000 | MEDICATED_PAD | Freq: Every day | CUTANEOUS | Status: DC
  Administered 2022-06-09 – 2022-06-10 (×2): 6 via TOPICAL

## 2022-06-09 MED ORDER — PREGABALIN 75 MG PO CAPS
150.0000 mg | ORAL_CAPSULE | Freq: Two times a day (BID) | ORAL | Status: DC
  Administered 2022-06-09 – 2022-06-10 (×2): 150 mg via ORAL
  Filled 2022-06-09 (×2): qty 2

## 2022-06-09 NOTE — Evaluation (Signed)
Occupational Therapy Evaluation Patient Details Name: Douglas [name] MRN: [id number] DOB: 09/02/1950 Today's Date: 06/09/2022   History of Present Illness This 72 yo male presented with reporting that he had sudden onset dizziness, burning feeling in the center of his chest that he has not had before, radiating down his arms, headache, flashing/pulsing red light in his right eye, roaring in his ears, feeling like he was "in a dream world" and feeling like his tongue was thick.  He was also observed to have right sided weakness in the face, arm and leg by wife. Head CT prior to TNK administration showed no acute process. 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 He currently is at a setup/S-Min A level for basic ADLs and min A for mobility when up on his feet with SPC. He will continue to benefit from acute OT without need for follow up.      Recommendations for follow up therapy are one component of a multi-disciplinary discharge planning process, led by the attending physician.  Recommendations may be updated based on patient status, additional functional criteria and insurance authorization.   Assistance Recommended at Discharge PRN  Patient can return home with the following A little help with walking and/or transfers;A little help with bathing/dressing/bathroom;Assistance with cooking/housework;Help with stairs or ramp for entrance;Assist for transportation    Functional Status Assessment  Patient has had a recent decline in their functional status and demonstrates the ability to make significant  improvements in function in a reasonable and predictable amount of time.  Equipment Recommendations  None recommended by OT       Precautions / Restrictions Precautions Precautions: Fall Precaution Comments: monitor O2, normally walks with a bent over posture Restrictions Weight Bearing Restrictions: No      Mobility Bed Mobility Overal bed mobility: Modified Independent             General bed mobility comments: increased time and HOB up for OOB, HOB down for back into bed    Transfers Overall transfer level: Needs assistance Equipment used: Straight cane Transfers: Sit to/from Stand Sit to Stand: Min assist, From elevated surface           General transfer comment: stands and ambulates with a bent over posture      Balance Overall balance assessment: Needs assistance Sitting-balance support: No upper extremity supported, Feet supported Sitting balance-Leahy Scale: Good     Standing balance support: Single extremity supported, Reliant on assistive device for balance Standing balance-Leahy Scale: Poor Standing balance comment: SPC                           ADL either performed or assessed with clinical judgement   ADL Overall ADL's : Needs assistance/impaired Eating/Feeding: Independent;Bed level   Grooming: Set up;Sitting   Upper Body Bathing: Set up;Sitting   Lower Body Bathing: Minimal assistance;Sit to/from stand   Upper Body Dressing : Set up;Sitting   Lower Body Dressing: Minimal assistance;Sit to/from stand   Toilet Transfer: Minimal assistance;+2 for safety/equipment;Ambulation Toilet Transfer Details (indicate cue type and reason): simulated bed>out door and down hall  several feet (see PT note)>back to bed Toileting- Clothing Manipulation and Hygiene: Minimal assistance;Sit to/from stand               Vision Patient Visual Report: No change from baseline                 Hand Dominance Right   Extremity/Trunk  Assessment Upper Extremity Assessment Upper Extremity Assessment: RUE deficits/detail;LUE deficits/detail RUE Deficits / Details: mild limited movement due to RA LUE Deficits / Details: mild limited movement due to RA           Communication Communication Communication: Expressive difficulties   Cognition Arousal/Alertness: Awake/alert Behavior During Therapy: WFL for tasks assessed/performed Overall Cognitive Status: Within Functional Limits for tasks assessed                                       General Comments  On 3 liters of O2 upon arrival with 95% O2, turned down gradually until no O2 with sats at 93%.With ambulation and return to room sats as low as 85%--placed pt on 2 liters of O2 with sats up yo 93%. Pt on 2-2.5 liters at home usually at night only            Home Living Family/patient expects to be discharged to:: Private residence Living Arrangements: Spouse/significant other Available Help at Discharge: Family Type of Home: House Home Access: Level entry     Home Layout: Two level;Able to live on main level with bedroom/bathroom;Laundry or work area in basement     [employer] Shower/Tub: Producer, television/film/video: Handicapped height     Home Equipment: Gilmer Mor - single point;Electric scooter (hurry cane)   Additional Comments: wears oxygen at night; lift chair      Prior Functioning/Environment                 ADLs Comments: mostly wipes off does shower in standing but does it fast; does drive once every couple weeks to pick up food for animals        OT Problem List: Impaired balance (sitting and/or standing)      OT Treatment/Interventions: Self-care/ADL training;DME and/or AE instruction;Patient/family education;Balance training    OT Goals(Current goals can be found in the care plan section) Acute Rehab OT Goals Patient Stated Goal: for his neuropathy pain get better (they have increased his meds) OT Goal  Formulation: With patient Time For Goal Achievement: 06/23/22 Potential to Achieve Goals: Good  OT Frequency: Min 2X/week    Co-evaluation PT/OT/SLP Co-Evaluation/Treatment: Yes Reason for Co-Treatment: For patient/therapist safety;To address functional/ADL transfers PT goals addressed during session: Balance;Mobility/safety with mobility;Proper use of DME;Strengthening/ROM OT goals addressed during session: Strengthening/ROM;ADL's and self-care      AM-PAC OT "6 Clicks" Daily Activity     Outcome Measure Help from another person eating meals?: None Help from another person taking care of personal grooming?: A Little Help from another person toileting, which includes using toliet, bedpan, or urinal?: A Little Help from another person bathing (including washing, rinsing, drying)?: A Little Help from another person to put on and taking off regular upper body clothing?: A Little Help from another person to put on and taking off regular lower body clothing?: A Little 6 Click Score: 19   End of Session Equipment Utilized During Treatment: Gait belt (SPC, also tried hemi-walker)  Activity Tolerance: Patient tolerated treatment well Patient left: in  bed;with call bell/phone within reach;with bed alarm set;with family/visitor present  OT Visit Diagnosis: Unsteadiness on feet (R26.81);Other abnormalities of gait and mobility (R26.89);Muscle weakness (generalized) (M62.81);Pain Pain - Right/Left: Right Pain - part of body: Ankle and joints of foot (shooting from neuropathy)                Time: 6962-9528 OT Time Calculation (min): 37 min Charges:  OT General Charges $OT Visit: 1 Visit OT Evaluation $OT Eval Moderate Complexity: 1 Mod  Cathy L. OT Acute Rehabilitation Services Office [phone]    Evette Georges 06/09/2022, 1:04 PM

## 2022-06-09 NOTE — Evaluation (Signed)
Physical Therapy Evaluation Patient Details Name: Douglas [name] MRN: [id number] DOB: 03/28/1950 Today's Date: 06/09/2022  History of Present Illness  This 72 yo male admitted 06/08/22 with stroke-like symptoms and R side weakness. Head CT prior to TNK administration showed no acute process. 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  Patient usually using cane with extended tip in the home and [place].  He does drive occasionally and completes most BADL's mod I.  Feel he is not far from baseline, but will benefit from skilled PT in the acute setting prior to d/c home with family support and likely no follow up PT needs at d/c.        Recommendations for follow up therapy are one component of a multi-disciplinary discharge planning process, led by the attending physician.  Recommendations may be updated based on patient status, additional functional criteria and insurance authorization.  Follow Up Recommendations       Assistance Recommended at Discharge Set up Supervision/Assistance  Patient can return home with the following  A little help with walking and/or transfers;Help with stairs or ramp for entrance;Assist for transportation    Equipment Recommendations None recommended by PT  Recommendations for Other Services       Functional Status Assessment Patient has had a recent decline in their functional status and demonstrates the ability to make significant improvements in function in a reasonable and predictable amount of time.     Precautions / Restrictions  Precautions Precautions: Fall Precaution Comments: monitor O2, normally walks with a bent over posture Restrictions Weight Bearing Restrictions: No      Mobility  Bed Mobility Overal bed mobility: Modified Independent             General bed mobility comments: increased time and HOB up for OOB, HOB down for back into bed    Transfers Overall transfer level: Needs assistance Equipment used: Straight cane Transfers: Sit to/from Stand Sit to Stand: Min assist, From elevated surface           General transfer comment: flexed posture throughout    Ambulation/Gait Ambulation/Gait assistance: Min guard Gait Distance (Feet): 80 Feet Assistive device: Straight cane, Hemi-walker Gait Pattern/deviations: Step-to pattern, Step-through pattern, Decreased stride length, Trunk flexed, Wide base of support       General Gait Details: limited trunk extension pt relates due to pain mostly L sciatica and in back with attempts to stand erect; using SPC initially, tried out QC briefly then hemiwalker, also discussed trying lofstrand crutch due to L wrist pain  Stairs            Wheelchair Mobility    Modified Rankin (Stroke Patients Only) Modified Rankin (Stroke Patients Only) Pre-Morbid Rankin Score: Moderate disability Modified Rankin: Moderately severe disability     Balance Overall balance assessment: Needs assistance Sitting-balance support: No upper extremity supported, Feet supported Sitting balance-Leahy Scale: Good     Standing balance support: Single extremity supported, Reliant on assistive device for balance Standing balance-Leahy Scale: Poor Standing balance comment: SPC and minguard A  Pertinent Vitals/Pain Pain Assessment Pain Assessment: 0-10 Pain Score: 10-Worst pain ever Pain Location: intermittent in R foot then moves to 2-3/10 Pain Descriptors / Indicators: Pins and needles, Shooting, Sharp Pain  Intervention(s): Monitored during session    Home Living Family/patient expects to be discharged to:: Private residence Living Arrangements: Spouse/significant other Available Help at Discharge: Family Type of Home: House Home Access: Level entry       Home Layout: Two level;Able to live on main level with bedroom/bathroom;Laundry or work area in [employer] Equipment: Gilmer Mor - single point;Electric scooter Additional Comments: wears oxygen at night; lift chair    Prior Function Prior Level of Function : Independent/Modified Independent               ADLs Comments: mostly wipes off does shower in standing but does it fast; does drive once every couple weeks to pick up food for animals     Hand Dominance   Dominant Hand: Right    Extremity/Trunk Assessment   Upper Extremity Assessment Upper Extremity Assessment: Defer to OT evaluation RUE Deficits / Details: mild limited movement due to RA LUE Deficits / Details: mild limited movement due to RA    Lower Extremity Assessment Lower Extremity Assessment: RLE deficits/detail;LLE deficits/detail RLE Deficits / Details: AROM generally WFL, uses lift chair to stand and tall bed at home so likely some limited ROM in hips/knees, note significant neuropathy to just above knees RLE Sensation: history of peripheral neuropathy LLE Deficits / Details: AROM generally WFL, uses lift chair to stand and tall bed at home so likely some limited ROM in hips/knees, note significant neuropathy to just above knees LLE Sensation: history of peripheral neuropathy    Cervical / Trunk Assessment Cervical / Trunk Assessment: Lordotic  Communication   Communication: Expressive difficulties  Cognition Arousal/Alertness: Awake/alert Behavior During Therapy: WFL for tasks assessed/performed Overall Cognitive Status: Within Functional Limits for tasks assessed                                          General Comments General  comments (skin integrity, edema, etc.): on 3L O2 upon arrival SpO2 95%, titrated O2 down to 0 and SpO2 93% with ambulation Sats down to 85% on RA replaced @ 2LPM at rest returned to 93%    Exercises     Assessment/Plan    PT Assessment Patient needs continued PT services  PT Problem List Decreased strength;Decreased balance;Decreased activity tolerance;Pain;Cardiopulmonary status limiting activity;Decreased mobility       PT Treatment Interventions DME instruction;Functional mobility training;Balance training;Gait training;Therapeutic exercise;Therapeutic activities;Patient/family education    PT Goals (Current goals can be found in the Care Plan section)  Acute Rehab PT Goals Patient Stated Goal: to return to independent PT Goal Formulation: With patient/family Time For Goal Achievement: 06/23/22    Frequency Min 3X/week     Co-evaluation PT/OT/SLP Co-Evaluation/Treatment: Yes Reason for Co-Treatment: For patient/therapist safety;To address functional/ADL transfers PT goals addressed during session: Balance;Mobility/safety with mobility;Proper use of DME;Strengthening/ROM OT goals addressed during session: Strengthening/ROM;ADL's and self-care       AM-PAC PT "6 Clicks" Mobility  Outcome Measure Help needed turning from your back to your side while in a flat bed without using bedrails?: A Little Help needed moving from lying on your back to sitting on the side of a flat bed without using bedrails?: A Little Help needed moving to and from a  bed to a chair (including a wheelchair)?: A Little Help needed standing up from a chair using your arms (e.g., wheelchair or bedside chair)?: A Little Help needed to walk in hospital room?: A Little Help needed climbing 3-5 steps with a railing? : Total 6 Click Score: 16    End of Session Equipment Utilized During Treatment: Gait belt Activity Tolerance: Patient tolerated treatment well Patient left: in bed;with call bell/phone within  reach;with family/visitor present   PT Visit Diagnosis: Other abnormalities of gait and mobility (R26.89);Pain Pain - Right/Left: Right Pain - part of body: Ankle and joints of foot    Time: 1610-9604 PT Time Calculation (min) (ACUTE ONLY): 37 min   Charges:   PT Evaluation $PT Eval Moderate Complexity: 1 Mod          Douglas [name], PT Acute Rehabilitation Services Office:[phone] 06/09/2022   Douglas [name] 06/09/2022, 2:18 PM

## 2022-06-09 NOTE — Progress Notes (Addendum)
STROKE TEAM PROGRESS NOTE   INTERVAL HISTORY Patient's wife and son are at bedside.  Wife reports that patient was feeling off and then told her that he needed to go to the hospital.  Wife states she saw no focal weakness or slurred speech yesterday before he came to the ED. Last known well was 1310/22.  Patient is not on anticoagulation at home.  On exam at [facility] via telestroke patient was noted to have right facial arm and leg weakness and was confused giving an NIH of 8.  TNK was discussed with his wife and it was given at 1546 4/22. On exam, he has a slight right facial droop, no drift seen in extremity assessment, positive sensation full visual fields.  Patient has also been having breathing problems and walking with a walker since he had COVID last year. He has history of multiple knee back hip and knee surgeries and neuropathy pain, lyrica and hydrocodone home meds.  Blood pressure adequately controlled.  MRI scan is pending  Vitals:   06/09/22 1100 06/09/22 1130 06/09/22 1200 06/09/22 1300  BP: 111/79  116/70 100/66  Pulse: 76  (!) 54 (!) 57  Resp: 16  15 19   Temp:  97.9 F (36.6 C)    TempSrc:  Oral    SpO2: 98%  96% 90%  Weight:      Height:       CBC:  Recent Labs  Lab 06/08/22 1504 06/08/22 1512  WBC 6.2  --   NEUTROABS 3.3  --   HGB 15.8 16.7  HCT 48.8 49.0  MCV 102.7*  --   PLT 106*  --    Basic Metabolic Panel:  Recent Labs  Lab 06/08/22 1504 06/08/22 1512  NA 137 136  K 3.8 3.8  CL 92* 93*  CO2 35*  --   GLUCOSE 150* 146*  BUN 18 18  CREATININE 0.98 1.00  CALCIUM 8.5*  --   MG 1.6*  --    Lipid Panel:  Recent Labs  Lab 06/09/22 0255  CHOL 111  TRIG 107  HDL 35*  CHOLHDL 3.2  VLDL 21  LDLCALC 55   HgbA1c:  Recent Labs  Lab 06/08/22 1933  HGBA1C 5.7*   Urine Drug Screen: No results for input(s): "LABOPIA", "COCAINSCRNUR", "LABBENZ", "AMPHETMU", "THCU", "LABBARB" in the last 168 hours.  Alcohol Level  Recent Labs  Lab  06/08/22 1504  ETH <10    IMAGING past 24 hours CT ANGIO HEAD NECK W WO CM (CODE STROKE)  Result Date: 06/08/2022 CLINICAL DATA:  Provided history: Neuro deficit, acute, stroke suspected. Right-sided weakness. Additional history provided: Right-sided weakness (leg greater than arm). EXAM: CT ANGIOGRAPHY HEAD AND NECK WITH AND WITHOUT CONTRAST TECHNIQUE: Multidetector CT imaging of the head and neck was performed using the standard protocol during bolus administration of intravenous contrast. Multiplanar CT image reconstructions and MIPs were obtained to evaluate the vascular anatomy. Carotid stenosis measurements (when applicable) are obtained utilizing NASCET criteria, using the distal internal carotid diameter as the denominator. RADIATION DOSE REDUCTION: This exam was performed according to the departmental dose-optimization program which includes automated exposure control, adjustment of the mA and/or kV according to patient size and/or use of iterative reconstruction technique. CONTRAST:  75mL OMNIPAQUE IOHEXOL 350 MG/ML SOLN COMPARISON:  Non-contrast head CT performed earlier today 06/08/2022. FINDINGS: CTA NECK FINDINGS Aortic arch: Standard aortic branching. Atherosclerotic plaque within the visualized aortic arch and proximal major branch vessels of the neck. Streak and beam hardening artifact  arising from a dense left-sided contrast bolus partially obscures the left subclavian artery. Within this limitation, there is no appreciable hemodynamically significant innominate or proximal subclavian artery stenosis. Right carotid system: CCA and ICA patent within the neck. Atherosclerotic plaque. Most notably, atherosclerotic plaque mild the carotid bifurcation and within the proximal ICA results in 60% stenosis origin of the right ICA. Tortuosity of the cervical ICA. Left carotid system: CCA and ICA patent within the neck without measurable stenosis. Scattered atherosclerotic plaque within the CCA, the  carotid bifurcation and within the proximal ICA. Tortuosity of the proximal ICA. Vertebral arteries: Codominant patent within the neck without hemodynamically significant stenosis. Nonstenotic atherosclerotic plaque within both vessels. Skeleton: No acute fracture or aggressive osseous lesion. Cervical spondylosis. Multilevel spinal canal stenosis. Most notably at C4-C5, C5-C6 and C6-C7, posterior disc osteophyte complexes contribute to at least moderate spinal canal stenosis. Multilevel bony neural foraminal narrowing. Other neck: No neck mass or cervical lymphadenopathy. Upper chest: Bandlike opacity within the inferior aspect of the right upper lobe. Mild atelectasis within imaged portions of the superior aspect of the right lower lobe. Review of the MIP images confirms the above findings CTA HEAD FINDINGS Anterior circulation: The intracranial internal carotid arteries are patent. Atherosclerotic plaque within both vessels with no more than mild stenosis. The M1 middle cerebral arteries are patent. No M2 proximal branch occlusion or high-grade proximal stenosis. The anterior cerebral arteries are patent. No intracranial aneurysm is identified. Posterior circulation: The intracranial vertebral arteries are patent. The basilar artery is patent. The posterior cerebral arteries are patent. A right posterior communicating artery is present. Posterior communicating arteries are present, bilaterally. Venous sinuses: Within the limitations of contrast timing, no convincing thrombus. Anatomic variants: As described. Review of the MIP images confirms the above findings No emergent large vessel occlusion identified. These results were called by telephone at the time of interpretation on 06/08/2022 at 3:56 pm to provider [facility] , who verbally acknowledged these results. IMPRESSION: CTA neck: 1. The common carotid and internal carotid arteries are patent within the neck. Atherosclerotic plaque bilaterally, as  described. Most notably, atherosclerotic plaque results in 60% stenosis at the origin of the right ICA. 2. Vertebral arteries patent within the neck without hemodynamically significant stenosis. Non-stenotic atherosclerotic plaque bilaterally. 3.  Aortic Atherosclerosis (ICD10-I70.0). 4. Cervical spondylosis, as described. Posterior disc osteophyte complexes contribute to at least moderate spinal canal stenosis at C4-C5, C5-C6 and C6-C7. Multilevel bony neural foraminal narrowing. 5. Bandlike pulmonary opacity within the inferior aspect of the right upper lobe. This may reflect atelectasis and/or scarring. However, correlate clinically to exclude signs/symptoms that would suggestive pneumonia. CTA head: 1. No intracranial large vessel occlusion or proximal high-grade arterial stenosis identified. 2. Atherosclerotic plaque within the intracranial internal carotid arteries with no more than mild stenosis. Electronically Signed   By: Jackey Loge D.O.   On: 06/08/2022 16:25   CT HEAD CODE STROKE WO CONTRAST  Result Date: 06/08/2022 CLINICAL DATA:  Code stroke.  Neuro deficit, acute, stroke suspected EXAM: CT HEAD WITHOUT CONTRAST TECHNIQUE: Contiguous axial images were obtained from the base of the skull through the vertex without intravenous contrast. RADIATION DOSE REDUCTION: This exam was performed according to the departmental dose-optimization program which includes automated exposure control, adjustment of the mA and/or kV according to patient size and/or use of iterative reconstruction technique. COMPARISON:  None Available. FINDINGS: Brain: Remote right cerebellar infarct no evidence of acute large vascular territory infarct, acute hemorrhage, mass lesion or midline shift. Patchy white  matter hypodensities, nonspecific but compatible with chronic microvascular ischemic change Vascular: No hyperdense vessel. Skull: No acute fracture. Sinuses/Orbits: Clear sinuses.  No acute orbital findings. ASPECTS [facility]  Stroke Program Early CT Score) total score (0-10 with 10 being normal): 10. IMPRESSION: 1. No evidence of acute large vascular territory infarct or acute hemorrhage. ASPECTS is 10. 2. 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 Findings discussed with Dr. Durwin Nora via telephone at 3:30 p.m. Electronically Signed   By: Feliberto Harts M.D.   On: 06/08/2022 15:33    PHYSICAL EXAM Physical Exam Gen: A&O x4, NAD Resp: normal WOB CV: extremities appear well-perfused   Neuro: *MS: oriented to self, place, year. Follows single step commands *Speech: mild dysarthria, no aphasia *CN: PERRL 3mm, EOMI, chronic blindness L eye, sensation intact, R lower facial droop, hearing intact to voice *Motor: No drift. RUE 4+/5, RLE 4/5.  *Sensory:Symmetric. .  *Coordination:  FNF intact bilaterally *Gait: deferred  ASSESSMENT/PLAN Mr. Douglas [name] is a 72 y.o. male with history of hypertension, previous back/neck/hip/knee surgeries, chronic pain, COVID 2023, oxygen requirements at home presenting with presents with acute onset of R sided weakness c/f acute ischemic stroke, now s/p TNK (given 4/22 1546). Transferred to [facility] neuro ICU for close monitoring post TNK and further mgmt. 24hr post-tnk imaging pending.   TIA vs Stroke--pending work-up completion s/p IV TNK administration Etiology:  pending work-up completion  Code Stroke CT head: No evidence of acute large vascular territory infarct or acute hemorrhage.  ASPECTS is 10. 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 Bandlike pulmonary opacity within the inferior aspect of the right upper lobe.  Repeat CT: pending MRI: pending  2D Echo: pending  LDL 55 HgbA1c 5.7 VTE prophylaxis - SCDs, can start SQ Heparin if head CT 24 hours post TNK is negative for ICH     Diet   Diet heart healthy/carb modified Room service  appropriate? Yes with Assist; Fluid consistency: Thin   No antithrombotic prior to admission, now on No antithrombotic pending post-TNK imaging negative for ICH. Therapy recommendations:  pending PT, No OT fu needed Disposition:  pending  Hypertension Home meds: Zestoretic--on hold SBP less than 180/105 for the 1st 24 hours post TNK to reduce the risk of hemorrhagic transformation  Long-term BP goal normotensive  Hyperlipidemia Home meds: Lipitor 40 mg, resumed in hospital LDL 55, goal < 70 Continue statin at discharge  Diabetes type II, no history Home meds:  none HgbA1c 5.7, goal < 7.0 CBGs Recent Labs    06/08/22 1439  GLUCAP 160*    SSI  Other Stroke Risk Factors Advanced Age >/= 38  Former Cigarette smoker  Other Active Problems Neuropathic pain, R foot Lyrica home med continued Chronic back and neck pain Hydrocodone home med continued  Hospital day # 1   Pt seen by Neuro NP/APP and later by MD. Note/plan to be edited by MD as needed.    Lynnae January, DNP, AGACNP-BC [facility] Please use AMION for contact information & EPIC for messaging.  STROKE MD NOTE :  I have personally obtained history,examined this patient, reviewed notes, independently viewed imaging studies, participated in medical decision making and plan of care.ROS completed by me personally and pertinent positives fully documented  I have made any additions or clarifications directly to the above note. Agree with note above.  Patient presented with what appeared to be a  presyncopal symptoms followed by right face arm and leg weakness and some confusion and was given IV TNK and has improved.  Continue strict blood pressure control as per post TNK protocol.  Mobilize out of bed.  Therapy consults.  Check MRI scan and echocardiogram.  Long discussion with patient and daughter at the bedside and answered questions. This patient is critically ill and at significant risk of neurological  worsening, death and care requires constant monitoring of vital signs, hemodynamics,respiratory and cardiac monitoring, extensive review of multiple databases, frequent neurological assessment, discussion with family, other specialists and medical decision making of high complexity.I have made any additions or clarifications directly to the above note.This critical care time does not reflect procedure time, or teaching time or supervisory time of PA/NP/Med Resident etc but could involve care discussion time.  I spent 30 minutes of neurocritical care time  in the care of  this patient.     Delia Heady, MD Medical Director [facility] Pager: [phone] 06/09/2022 3:48 PM  To contact Stroke Continuity provider, please refer to [email or web address]. After hours, contact General Neurology

## 2022-06-09 NOTE — Progress Notes (Signed)
PT Cancellation Note  Patient Details Name: Douglas [name] MRN: [id number] DOB: November 27, 1950   Cancelled Treatment:    Reason Eval/Treat Not Completed: Active bedrest order; will follow up when activity orders upgraded.    Elray Mcgregor 06/09/2022, 8:46 AM Sheran Lawless, PT Acute Rehabilitation Services Office:[phone] 06/09/2022

## 2022-06-10 ENCOUNTER — Inpatient Hospital Stay (HOSPITAL_COMMUNITY): Payer: Medicare Other

## 2022-06-10 DIAGNOSIS — I6389 Other cerebral infarction: Secondary | ICD-10-CM | POA: Diagnosis not present

## 2022-06-10 DIAGNOSIS — E785 Hyperlipidemia, unspecified: Secondary | ICD-10-CM | POA: Diagnosis present

## 2022-06-10 DIAGNOSIS — R2990 Unspecified symptoms and signs involving the nervous system: Secondary | ICD-10-CM

## 2022-06-10 DIAGNOSIS — I10 Essential (primary) hypertension: Secondary | ICD-10-CM | POA: Diagnosis present

## 2022-06-10 LAB — CBC WITH DIFFERENTIAL/PLATELET
Abs Immature Granulocytes: 0.01 10*3/uL (ref 0.00–0.07)
Basophils Absolute: 0 10*3/uL (ref 0.0–0.1)
Basophils Relative: 1 %
Eosinophils Absolute: 0.2 10*3/uL (ref 0.0–0.5)
Eosinophils Relative: 4 %
HCT: 45.4 % (ref 39.0–52.0)
Hemoglobin: 14.7 g/dL (ref 13.0–17.0)
Immature Granulocytes: 0 %
Lymphocytes Relative: 38 %
Lymphs Abs: 1.8 10*3/uL (ref 0.7–4.0)
MCH: 33.4 pg (ref 26.0–34.0)
MCHC: 32.4 g/dL (ref 30.0–36.0)
MCV: 103.2 fL — ABNORMAL HIGH (ref 80.0–100.0)
Monocytes Absolute: 0.6 10*3/uL (ref 0.1–1.0)
Monocytes Relative: 12 %
Neutro Abs: 2.2 10*3/uL (ref 1.7–7.7)
Neutrophils Relative %: 45 %
Platelets: 85 10*3/uL — ABNORMAL LOW (ref 150–400)
RBC: 4.4 MIL/uL (ref 4.22–5.81)
RDW: 12 % (ref 11.5–15.5)
WBC: 4.8 10*3/uL (ref 4.0–10.5)
nRBC: 0 % (ref 0.0–0.2)

## 2022-06-10 LAB — MAGNESIUM: Magnesium: 1.8 mg/dL (ref 1.7–2.4)

## 2022-06-10 LAB — COMPREHENSIVE METABOLIC PANEL
ALT: 10 U/L (ref 0–44)
AST: 11 U/L — ABNORMAL LOW (ref 15–41)
Albumin: 3 g/dL — ABNORMAL LOW (ref 3.5–5.0)
Alkaline Phosphatase: 46 U/L (ref 38–126)
Anion gap: 12 (ref 5–15)
BUN: 14 mg/dL (ref 8–23)
CO2: 33 mmol/L — ABNORMAL HIGH (ref 22–32)
Calcium: 9.1 mg/dL (ref 8.9–10.3)
Chloride: 91 mmol/L — ABNORMAL LOW (ref 98–111)
Creatinine, Ser: 0.84 mg/dL (ref 0.61–1.24)
GFR, Estimated: >60 mL/min (ref >60)
Glucose, Bld: 93 mg/dL (ref 70–99)
Potassium: 3.9 mmol/L (ref 3.5–5.1)
Sodium: 136 mmol/L (ref 135–145)
Total Bilirubin: 0.6 mg/dL (ref 0.3–1.2)
Total Protein: 5.8 g/dL — ABNORMAL LOW (ref 6.5–8.1)

## 2022-06-10 LAB — ECHOCARDIOGRAM COMPLETE
AR max vel: 2.01 cm2
AV Area VTI: 2.31 cm2
AV Area mean vel: 2.09 cm2
AV Mean grad: 3.5 mmHg
AV Peak grad: 5.7 mmHg
Ao pk vel: 1.2 m/s
Area-P 1/2: 2.24 cm2
Calc EF: 57.2 %
Height: 74 in
MV VTI: 1.81 cm2
S' Lateral: 3.7 cm
Single Plane A2C EF: 58.1 %
Single Plane A4C EF: 56 %
Weight: 3424 oz

## 2022-06-10 MED ORDER — ATORVASTATIN CALCIUM 40 MG PO TABS: 40.0000 mg | ORAL_TABLET | Freq: Every day | ORAL | 0 refills | Status: DC

## 2022-06-10 MED ORDER — ASPIRIN 81 MG PO TBEC: 81.0000 mg | DELAYED_RELEASE_TABLET | Freq: Every day | ORAL | 12 refills | Status: DC

## 2022-06-10 NOTE — Progress Notes (Signed)
Discharge instructions discussed with patient and wife. All questions answered. Iv's removed, telemetry discontinued. Patient walker and oxygen delivered to room and taken by wife. Belongings returned. Patient wheeled off unit for transport home.  Melony Overly, RN

## 2022-06-10 NOTE — Discharge Summary (Addendum)
Stroke Discharge Summary  Patient ID: Douglas [name]   MRN: [id number]      DOB: 12/28/1950  Date of Admission: 06/08/2022 Date of Discharge: 06/10/2022  Attending Physician:  Stroke, Md, MD, Stroke MD Consultant(s):    None  Patient's PCP:  Elise Benne, MD  DISCHARGE DIAGNOSIS:  Principal Problem:   Stroke-like symptoms -probable atypical migraine Active Problems:   Stroke determined by clinical assessment   Essential hypertension   Hyperlipidemia   Allergies as of 06/10/2022       Reactions   Cefazolin Rash   See 08/2012 admission   Morphine Sulfate    Penicillins Hives   Hydrochlorothiazide Rash   Other Rash   Tide detergent   Oxycodone Rash   Vancomycin Rash   Drug rash        Medication List     STOP taking these medications    HYDROcodone-acetaminophen 10-325 MG tablet Commonly known as: NORCO       TAKE these medications    aspirin EC 81 MG tablet Take 1 tablet (81 mg total) by mouth daily. Swallow whole. Start taking on: June 11, 2022   atorvastatin 40 MG tablet Commonly known as: LIPITOR Take 1 tablet (40 mg total) by mouth at bedtime. What changed:  how much to take when to take this   famotidine 20 MG tablet Commonly known as: PEPCID Take 20 mg by mouth 2 (two) times daily.   latanoprost 0.005 % ophthalmic solution Commonly known as: XALATAN Place 1 drop into both eyes at bedtime.   lisinopril-hydrochlorothiazide 10-12.5 MG tablet Commonly known as: ZESTORETIC Take 1 tablet by mouth daily.   pregabalin 150 MG capsule Commonly known as: LYRICA Take 150 mg by mouth 2 (two) times daily. What changed: Another medication with the same name was removed. Continue taking this medication, and follow the directions you see here.   sulfaSALAzine 500 MG tablet Commonly known as: AZULFIDINE Take 4 tablets (2,000 mg total) by mouth 2 (two) times daily as directed What changed:  how much to take Another medication with the  same name was removed. Continue taking this medication, and follow the directions you see here.   timolol 0.5 % ophthalmic solution Commonly known as: TIMOPTIC Place 1 drop into both eyes daily.   Vitamin D-3 5000 UNIT/ML Liqd Take by mouth.               Durable Medical Equipment  (From admission, onward)           Start     Ordered   06/10/22 1334  For home use only DME Walker rolling  Once       Question Answer Comment  Walker: With 5 Inch Wheels   Patient needs a walker to treat with the following condition Weakness      06/10/22 1334   06/10/22 1325  For home use only DME Other see comment  Once       Comments: POC evaluation for shoulder bag oxygen  Question:  Length of Need  Answer:  Lifetime   06/10/22 1325            LABORATORY STUDIES CBC    Component Value Date/Time   WBC 4.8 06/10/2022 0329   RBC 4.40 06/10/2022 0329   HGB 14.7 06/10/2022 0329   HCT 45.4 06/10/2022 0329   PLT 85 (L) 06/10/2022 0329   MCV 103.2 (H) 06/10/2022 0329   MCH 33.4 06/10/2022 0329   MCHC  32.4 06/10/2022 0329   RDW 12.0 06/10/2022 0329   LYMPHSABS 1.8 06/10/2022 0329   MONOABS 0.6 06/10/2022 0329   EOSABS 0.2 06/10/2022 0329   BASOSABS 0.0 06/10/2022 0329   CMP    Component Value Date/Time   NA 136 06/10/2022 0329   K 3.9 06/10/2022 0329   CL 91 (L) 06/10/2022 0329   CO2 33 (H) 06/10/2022 0329   GLUCOSE 93 06/10/2022 0329   BUN 14 06/10/2022 0329   CREATININE 0.84 06/10/2022 0329   CALCIUM 9.1 06/10/2022 0329   PROT 5.8 (L) 06/10/2022 0329   ALBUMIN 3.0 (L) 06/10/2022 0329   AST 11 (L) 06/10/2022 0329   ALT 10 06/10/2022 0329   ALKPHOS 46 06/10/2022 0329   BILITOT 0.6 06/10/2022 0329   GFRNONAA >60 06/10/2022 0329   GFRAA 77 (L) 11/24/2011 0112   COAGS Lab Results  Component Value Date   INR 1.0 06/08/2022   INR 1.1 02/19/2020   INR 2.5 (H) 09/25/2007   Lipid Panel    Component Value Date/Time   CHOL 111 06/09/2022 0255   TRIG 107  06/09/2022 0255   HDL 35 (L) 06/09/2022 0255   CHOLHDL 3.2 06/09/2022 0255   VLDL 21 06/09/2022 0255   LDLCALC 55 06/09/2022 0255   HgbA1C  Lab Results  Component Value Date   HGBA1C 5.7 (H) 06/08/2022   Urinalysis    Component Value Date/Time   COLORURINE AMBER (A) 02/20/2020 1004   APPEARANCEUR HAZY (A) 02/20/2020 1004   LABSPEC 1.020 02/20/2020 1004   PHURINE 5.0 02/20/2020 1004   GLUCOSEU NEGATIVE 02/20/2020 1004   HGBUR MODERATE (A) 02/20/2020 1004   BILIRUBINUR NEGATIVE 02/20/2020 1004   KETONESUR NEGATIVE 02/20/2020 1004   PROTEINUR 30 (A) 02/20/2020 1004   UROBILINOGEN 1.0 09/15/2007 1008   NITRITE NEGATIVE 02/20/2020 1004   LEUKOCYTESUR NEGATIVE 02/20/2020 1004   Urine Drug Screen No results found for: "LABOPIA", "COCAINSCRNUR", "LABBENZ", "AMPHETMU", "THCU", "LABBARB"  Alcohol Level    Component Value Date/Time   ETH <10 06/08/2022 1504     SIGNIFICANT DIAGNOSTIC STUDIES ECHOCARDIOGRAM COMPLETE  Result Date: 06/10/2022    ECHOCARDIOGRAM REPORT   Patient Name:   Douglas [name] Date of Exam: 06/10/2022 Medical Rec #:  [id number]             Height:       74.0 in Accession #:    [id number]            Weight:       214.0 lb Date of Birth:  01/28/1951            BSA:          2.237 m Patient Age:    72 years              BP:           138/70 mmHg Patient Gender: M                     HR:           53 bpm. Exam Location:  Inpatient Procedure: 2D Echo, Cardiac Doppler and Color Doppler Indications:    Stroke  History:        Patient has no prior history of Echocardiogram examinations.                 Stroke; Arrythmias:Atrial Fibrillation.  Sonographer:    Wallie Char Referring Phys: Erick Blinks IMPRESSIONS  1. Left ventricular ejection fraction, by  estimation, is 55 to 60%. The left ventricle has normal function. The left ventricle has no regional wall motion abnormalities. Left ventricular diastolic parameters are consistent with Grade I diastolic dysfunction  (impaired relaxation).  2. Right ventricular systolic function is moderately reduced. The right ventricular size is moderately enlarged.  3. The mitral valve is normal in structure. Trivial mitral valve regurgitation. No evidence of mitral stenosis.  4. The aortic valve is tricuspid. Aortic valve regurgitation is trivial. Aortic valve sclerosis is present, with no evidence of aortic valve stenosis. Aortic valve area, by VTI measures 2.31 cm. Aortic valve mean gradient measures 3.5 mmHg. Aortic valve Vmax measures 1.20 m/s.  5. The inferior vena cava is normal in size with greater than 50% respiratory variability, suggesting right atrial pressure of 3 mmHg. Conclusion(s)/Recommendation(s): No intracardiac source of embolism detected on this transthoracic study. Consider a transesophageal echocardiogram to exclude cardiac source of embolism if clinically indicated. FINDINGS  Left Ventricle: Left ventricular ejection fraction, by estimation, is 55 to 60%. The left ventricle has normal function. The left ventricle has no regional wall motion abnormalities. The left ventricular internal cavity size was normal in size. There is  no left ventricular hypertrophy. Left ventricular diastolic parameters are consistent with Grade I diastolic dysfunction (impaired relaxation). Normal left ventricular filling pressure. Right Ventricle: The right ventricular size is moderately enlarged. No increase in right ventricular wall thickness. Right ventricular systolic function is moderately reduced. Left Atrium: Left atrial size was normal in size. Right Atrium: Right atrial size was normal in size. Pericardium: There is no evidence of pericardial effusion. Mitral Valve: The mitral valve is normal in structure. Trivial mitral valve regurgitation. No evidence of mitral valve stenosis. MV peak gradient, 1.9 mmHg. The mean mitral valve gradient is 1.0 mmHg. Tricuspid Valve: The tricuspid valve is normal in structure. Tricuspid valve  regurgitation is mild . No evidence of tricuspid stenosis. Aortic Valve: The aortic valve is tricuspid. Aortic valve regurgitation is trivial. Aortic valve sclerosis is present, with no evidence of aortic valve stenosis. Aortic valve mean gradient measures 3.5 mmHg. Aortic valve peak gradient measures 5.7 mmHg. Aortic valve area, by VTI measures 2.31 cm. Pulmonic Valve: The pulmonic valve was normal in structure. Pulmonic valve regurgitation is trivial. No evidence of pulmonic stenosis. Aorta: The aortic root is normal in size and structure. Venous: The inferior vena cava is normal in size with greater than 50% respiratory variability, suggesting right atrial pressure of 3 mmHg. IAS/Shunts: No atrial level shunt detected by color flow Doppler.  LEFT VENTRICLE PLAX 2D LVIDd:         5.10 cm      Diastology LVIDs:         3.70 cm      LV e' medial:    5.49 cm/s LV PW:         0.80 cm      LV E/e' medial:  10.7 LV IVS:        1.00 cm      LV e' lateral:   8.43 cm/s LVOT diam:     2.00 cm      LV E/e' lateral: 7.0 LV SV:         53 LV SV Index:   24 LVOT Area:     3.14 cm  LV Volumes (MOD) LV vol d, MOD A2C: 104.0 ml LV vol d, MOD A4C: 120.0 ml LV vol s, MOD A2C: 43.6 ml LV vol s, MOD A4C: 52.8 ml LV SV  MOD A2C:     60.4 ml LV SV MOD A4C:     120.0 ml LV SV MOD BP:      66.7 ml RIGHT VENTRICLE RV Basal diam:  4.60 cm RV S prime:     9.36 cm/s TAPSE (M-mode): 1.5 cm LEFT ATRIUM             Index        RIGHT ATRIUM           Index LA diam:        4.60 cm 2.06 cm/m   RA Area:     19.90 cm LA Vol (A2C):   60.7 ml 27.14 ml/m  RA Volume:   55.30 ml  24.72 ml/m LA Vol (A4C):   59.0 ml 26.38 ml/m LA Biplane Vol: 60.3 ml 26.96 ml/m  AORTIC VALVE AV Area (Vmax):    2.01 cm AV Area (Vmean):   2.09 cm AV Area (VTI):     2.31 cm AV Vmax:           119.50 cm/s AV Vmean:          87.500 cm/s AV VTI:            0.229 m AV Peak Grad:      5.7 mmHg AV Mean Grad:      3.5 mmHg LVOT Vmax:         76.50 cm/s LVOT Vmean:         58.150 cm/s LVOT VTI:          0.168 m LVOT/AV VTI ratio: 0.74  AORTA Ao Root diam: 3.40 cm Ao Asc diam:  3.40 cm MITRAL VALVE               TRICUSPID VALVE MV Area (PHT): 2.24 cm    TR Peak grad:   48.2 mmHg MV Area VTI:   1.81 cm    TR Vmax:        347.00 cm/s MV Peak grad:  1.9 mmHg MV Mean grad:  1.0 mmHg    SHUNTS MV Vmax:       0.68 m/s    Systemic VTI:  0.17 m MV Vmean:      36.9 cm/s   Systemic Diam: 2.00 cm MV Decel Time: 338 msec MV E velocity: 58.90 cm/s MV A velocity: 70.10 cm/s MV E/A ratio:  0.84 Armanda Magic MD Electronically signed by Armanda Magic MD Signature Date/Time: 06/10/2022/11:38:31 AM    Final    MR BRAIN WO CONTRAST  Result Date: 06/09/2022 CLINICAL DATA:  Neuro deficit, acute, stroke suspected. EXAM: MRI HEAD WITHOUT CONTRAST TECHNIQUE: Multiplanar, multiecho pulse sequences of the brain and surrounding structures were obtained without intravenous contrast. COMPARISON:  Head CT and CTA head/neck 06/08/2022. FINDINGS: Brain: No acute infarct or hemorrhage. No hydrocephalus or extra-axial collection. Few cortical and subcortical microhemorrhages in the occipital lobes and cerebellum. 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 No mass or midline shift. Vascular: Normal flow voids. Skull and upper cervical spine: Normal marrow signal. Unchanged cervical spondylosis, better evaluated on recent CTA. Sinuses/Orbits: Unremarkable. Other: None. IMPRESSION: 1. No acute intracranial process. 2. Few cortical and subcortical microhemorrhages in the occipital lobes and cerebellum, which can be seen in the setting of cerebral amyloid angiopathy. Electronically Signed   By: Orvan Falconer M.D.   On: 06/09/2022 16:10   CT ANGIO HEAD NECK W WO CM (CODE STROKE)  Result Date: 06/08/2022 CLINICAL DATA:  Provided history: Neuro deficit, acute, stroke  suspected. Right-sided weakness. Additional history provided: Right-sided weakness (leg greater than arm). EXAM: CT ANGIOGRAPHY HEAD AND NECK WITH  AND WITHOUT CONTRAST TECHNIQUE: Multidetector CT imaging of the head and neck was performed using the standard protocol during bolus administration of intravenous contrast. Multiplanar CT image reconstructions and MIPs were obtained to evaluate the vascular anatomy. Carotid stenosis measurements (when applicable) are obtained utilizing NASCET criteria, using the distal internal carotid diameter as the denominator. RADIATION DOSE REDUCTION: This exam was performed according to the departmental dose-optimization program which includes automated exposure control, adjustment of the mA and/or kV according to patient size and/or use of iterative reconstruction technique. CONTRAST:  75mL OMNIPAQUE IOHEXOL 350 MG/ML SOLN COMPARISON:  Non-contrast head CT performed earlier today 06/08/2022. FINDINGS: CTA NECK FINDINGS Aortic arch: Standard aortic branching. Atherosclerotic plaque within the visualized aortic arch and proximal major branch vessels of the neck. Streak and beam hardening artifact arising from a dense left-sided contrast bolus partially obscures the left subclavian artery. Within this limitation, there is no appreciable hemodynamically significant innominate or proximal subclavian artery stenosis. Right carotid system: CCA and ICA patent within the neck. Atherosclerotic plaque. Most notably, atherosclerotic plaque mild the carotid bifurcation and within the proximal ICA results in 60% stenosis origin of the right ICA. Tortuosity of the cervical ICA. Left carotid system: CCA and ICA patent within the neck without measurable stenosis. Scattered atherosclerotic plaque within the CCA, the carotid bifurcation and within the proximal ICA. Tortuosity of the proximal ICA. Vertebral arteries: Codominant patent within the neck without hemodynamically significant stenosis. Nonstenotic atherosclerotic plaque within both vessels. Skeleton: No acute fracture or aggressive osseous lesion. Cervical spondylosis. Multilevel  spinal canal stenosis. Most notably at C4-C5, C5-C6 and C6-C7, posterior disc osteophyte complexes contribute to at least moderate spinal canal stenosis. Multilevel bony neural foraminal narrowing. Other neck: No neck mass or cervical lymphadenopathy. Upper chest: Bandlike opacity within the inferior aspect of the right upper lobe. Mild atelectasis within imaged portions of the superior aspect of the right lower lobe. Review of the MIP images confirms the above findings CTA HEAD FINDINGS Anterior circulation: The intracranial internal carotid arteries are patent. Atherosclerotic plaque within both vessels with no more than mild stenosis. The M1 middle cerebral arteries are patent. No M2 proximal branch occlusion or high-grade proximal stenosis. The anterior cerebral arteries are patent. No intracranial aneurysm is identified. Posterior circulation: The intracranial vertebral arteries are patent. The basilar artery is patent. The posterior cerebral arteries are patent. A right posterior communicating artery is present. Posterior communicating arteries are present, bilaterally. Venous sinuses: Within the limitations of contrast timing, no convincing thrombus. Anatomic variants: As described. Review of the MIP images confirms the above findings No emergent large vessel occlusion identified. These results were called by telephone at the time of interpretation on 06/08/2022 at 3:56 pm to provider [facility] , who verbally acknowledged these results. IMPRESSION: CTA neck: 1. The common carotid and internal carotid arteries are patent within the neck. Atherosclerotic plaque bilaterally, as described. Most notably, atherosclerotic plaque results in 60% stenosis at the origin of the right ICA. 2. Vertebral arteries patent within the neck without hemodynamically significant stenosis. Non-stenotic atherosclerotic plaque bilaterally. 3.  Aortic Atherosclerosis (ICD10-I70.0). 4. Cervical spondylosis, as described. Posterior  disc osteophyte complexes contribute to at least moderate spinal canal stenosis at C4-C5, C5-C6 and C6-C7. Multilevel bony neural foraminal narrowing. 5. Bandlike pulmonary opacity within the inferior aspect of the right upper lobe. This may reflect atelectasis and/or scarring. However, correlate clinically to  exclude signs/symptoms that would suggestive pneumonia. CTA head: 1. No intracranial large vessel occlusion or proximal high-grade arterial stenosis identified. 2. Atherosclerotic plaque within the intracranial internal carotid arteries with no more than mild stenosis. Electronically Signed   By: Jackey Loge D.O.   On: 06/08/2022 16:25   CT HEAD CODE STROKE WO CONTRAST  Result Date: 06/08/2022 CLINICAL DATA:  Code stroke.  Neuro deficit, acute, stroke suspected EXAM: CT HEAD WITHOUT CONTRAST TECHNIQUE: Contiguous axial images were obtained from the base of the skull through the vertex without intravenous contrast. RADIATION DOSE REDUCTION: This exam was performed according to the departmental dose-optimization program which includes automated exposure control, adjustment of the mA and/or kV according to patient size and/or use of iterative reconstruction technique. COMPARISON:  None Available. FINDINGS: Brain: Remote right cerebellar infarct no evidence of acute large vascular territory infarct, acute hemorrhage, mass lesion or midline shift. Patchy white matter hypodensities, nonspecific but compatible with chronic microvascular ischemic change Vascular: No hyperdense vessel. Skull: No acute fracture. Sinuses/Orbits: Clear sinuses.  No acute orbital findings. ASPECTS [facility] Stroke Program Early CT Score) total score (0-10 with 10 being normal): 10. IMPRESSION: 1. No evidence of acute large vascular territory infarct or acute hemorrhage. ASPECTS is 10. 2. 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 Findings discussed with Dr. Durwin Nora via telephone at 3:30 p.m.  Electronically Signed   By: Feliberto Harts M.D.   On: 06/08/2022 15:33      HISTORY OF PRESENT ILLNESS Douglas [name] is a 72 y.o. male with history of hypertension, previous back/neck/hip/knee surgeries, chronic pain, COVID 2023, oxygen requirements at home presenting with presents with acute onset of R sided weakness c/f acute ischemic stroke, now s/p TNK (given 4/22 1546). Transferred to [facility] neuro ICU for close monitoring post TNK and further mgmt. 24hr post-tnk imaging pending.    HOSPITAL COURSE  Strokelike symptoms status post TNK versus TIA  Code Stroke CT head: No evidence of acute large vascular territory infarct or acute hemorrhage.  ASPECTS is 10. 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 Bandlike pulmonary opacity within the inferior aspect of the right upper lobe.   MRI: No acute process.  Few cortical and subcortical microhemorrhages in the occipital lobes and cerebellum   2D Echo: EF 55 to 60%.  Left ventricular grade 1 diastolic dysfunction.  Right ventricular systolic function is moderately reduced, right ventricular is moderately enlarged   LDL 55 HgbA1c 5.7 No antithrombotic prior to admission, now on aspirin 81 mg for secondary stroke prevention  Hypertension Home meds: Zestoretic--on hold SBP less than 180/105 for the 1st 24 hours post TNK to reduce the risk of hemorrhagic transformation  Long-term BP goal normotensive   Hyperlipidemia Home meds: Lipitor 40 mg, resumed in hospital LDL 55, goal < 70 Continue statin at discharge   Other Stroke Risk Factors Advanced Age >/= 33  Former Cigarette smoker   Other Active Problems Neuropathic pain, R foot Lyrica home med continued Chronic back and neck pain Hydrocodone home med continued     DISCHARGE EXAM Blood pressure 133/65, pulse (!) 59, temperature 97.9 F (36.6 C), temperature source Oral,  resp. rate 16, height 6\' 2"  (1.88 m), weight 97.1 kg, SpO2 95 %.   HYSICAL EXAM Physical Exam Gen: A&O x4, NAD Resp: normal WOB CV: extremities appear well-perfused   Neuro: *MS: oriented to self, place, year. Follows single step  commands *Speech: mild dysarthria, no aphasia *CN: PERRL 3mm, EOMI, chronic blindness L eye, sensation intact, R lower facial droop, hearing intact to voice *Motor: No drift. RUE 4+/5, RLE 4/5.  *Sensory:Symmetric. .  *Coordination:  FNF intact bilaterally *Gait: deferred   Discharge Diet       Diet   Diet heart healthy/carb modified Room service appropriate? Yes with Assist; Fluid consistency: Thin   liquids  DISCHARGE PLAN Disposition: Home Aspirin 81 mg daily for secondary stroke prevention  Ongoing stroke risk factor control by Primary Care Physician at time of discharge Follow-up PCP Elise Benne, MD in 2 weeks. Follow-up in [facility] in 8 weeks, office to schedule an appointment with NP.   50 minutes were spent preparing discharge.  Gevena Mart DNP, ACNPC-AG  [facility]  I have personally obtained history,examined this patient, reviewed notes, independently viewed imaging studies, participated in medical decision making and plan of care.ROS completed by me personally and pertinent positives fully documented  I have made any additions or clarifications directly to the above note. Agree with note above.    Delia Heady, MD Medical Director [facility] Pager: [phone] 06/10/2022 2:54 PM

## 2022-06-10 NOTE — Progress Notes (Signed)
Physical Therapy Treatment Patient Details Name: Douglas [name] MRN: [id number] DOB: Nov 18, 1950 Today's Date: 06/10/2022   History of Present Illness This 72 yo male admitted 06/08/22 with stroke-like symptoms and R side weakness. Head CT prior to TNK administration showed no acute process. 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 Notified RN of pt's request for pain meds. Attempted using lofstrand crutches due to his wrist pain, but pt unable to safely keep the distal end of the crutch on the ground due to his back pain today. He believes he could manage it safely once his back pain is better under control. Pt required a RW for bil UE support to stand and ambulate today at a min guard assist level. He reports he has a standard walker with tennis balls on it at home he can use if his pain is bad at home. Performed seated stretches and exercises to promote mobility/increased ROM. Will continue to follow acutely.    Recommendations for follow up therapy are one component of a multi-disciplinary discharge planning process, led by the attending physician.  Recommendations may be updated based on patient status, additional functional criteria and insurance authorization.  Follow Up Recommendations       Assistance Recommended at Discharge Set up Supervision/Assistance  Patient can return home with the following A little help with walking and/or transfers;Help with stairs or ramp for entrance;Assist for transportation;Assistance with cooking/housework   Equipment Recommendations  Other  (comment) (lofstrand crutches)    Recommendations for Other Services       Precautions / Restrictions Precautions Precautions: Fall Precaution Comments: monitor O2, normally walks with a bent over posture Restrictions Weight Bearing Restrictions: No     Mobility  Bed Mobility Overal bed mobility: Modified Independent             General bed mobility comments: increased time and HOB up for OOB    Transfers Overall transfer level: Needs assistance Equipment used: Lofstrands, Quad cane, Rolling walker (2 wheels) Transfers: Sit to/from Stand Sit to Stand: Min assist, Min guard           General transfer comment: Pt with a flexed posture coming to stand from low edge of bed surface. x2 to Lo[place]rand on his R, needing minA for stability after coming to stand due to pt repositioning self often due to back pain. x1 to QC with min guard assist and bil hands on QC for support and flexed posture. x2 to RW, needing cues to push up from bed, min guard for safety.    Ambulation/Gait Ambulation/Gait assistance: Min guard, Min assist Gait Distance (Feet): 60 Feet (x3 bouts of ~5 ft > ~60 ft > ~20 ft) Assistive device: Lofstrands, Quad cane, Rolling walker (2 wheels) Gait Pattern/deviations: Step-through pattern, Decreased stride length, Trunk flexed, Wide base of support Gait velocity: reduced Gait velocity interpretation: <1.31 ft/sec, indicative of household ambulator   General Gait Details: Pt initially with loftstrand crutch in R hand but unable to keep bottom aspect of lofstrand safely on ground without it slipping while standing at EOB, thus did not attempt to ambulate with it. Transitioned  to QC with pt taking a few steps anterior and posterior with noted instability, minA for balance and cued pt to sit to attempt using RW for improved safety. Improved stability with RW, progressing to a min guard assist level, but slow and with a flexed posture due to back pain.   Stairs              Wheelchair Mobility    Modified Rankin (Stroke Patients Only) Modified Rankin (Stroke Patients Only) Pre-Morbid Rankin Score: Moderate disability Modified Rankin: Moderately severe disability     Balance Overall balance assessment: Needs assistance Sitting-balance support: No upper extremity supported, Feet supported Sitting balance-Leahy Scale: Good Sitting balance - Comments: Changes positions frequently sitting EOB due to back pain   Standing balance support: Single extremity supported, Bilateral upper extremity supported, During functional activity, Reliant on assistive device for balance Standing balance-Leahy Scale: Poor Standing balance comment: Reliant on bil UE support today                            Cognition Arousal/Alertness: Awake/alert Behavior During Therapy: WFL for tasks assessed/performed Overall Cognitive Status: Within Functional Limits for tasks assessed                                 General Comments: Pt able to recall DME he used yesterday but would repeat "this is my first day" or "this is my first time up", likely referring to first time up this morning, likely at baseline        Exercises General Exercises - Lower Extremity Long Arc Quad: AROM, Both, 10 reps, Seated Hip ABduction/ADduction: AROM, Both, 10 reps, Seated Hip Flexion/Marching: AROM, Both, 10 reps, Seated Other Exercises Other Exercises: seated back stretches at EOB    General Comments        Pertinent Vitals/Pain Pain Assessment Pain Assessment: Faces Faces Pain Scale: Hurts whole lot Pain Location: back, wrists Pain Descriptors / Indicators: Discomfort, Grimacing, Guarding, Moaning Pain Intervention(s): Limited activity within patient's tolerance, Monitored during session, Repositioned, Patient requesting pain meds-RN notified    Home Living                          Prior Function            PT Goals (current goals can  now be found in the care plan section) Acute Rehab PT Goals Patient Stated Goal: to reduce pain PT Goal Formulation: With patient Time For Goal Achievement: 06/23/22 Progress towards PT goals: Progressing toward goals    Frequency    Min 3X/week      PT Plan Equipment recommendations need to be updated    Co-evaluation              AM-PAC PT "6 Clicks" Mobility   Outcome Measure  Help needed turning from your back to your side while in a flat bed without using bedrails?: None Help needed moving from lying on your back to sitting on the side of a flat bed without using bedrails?: None Help needed moving to and from a bed to a chair (including a wheelchair)?: A Little Help needed standing up from a chair using your arms (e.g., wheelchair or bedside chair)?: A Little Help needed to walk in hospital room?: A Little Help needed climbing 3-5 steps with a railing? : Total 6 Click Score: 18  End of Session   Activity Tolerance: Patient tolerated treatment well;Patient limited by pain Patient left: in chair;with call bell/phone within reach;with chair alarm set Nurse Communication: Patient requests pain meds PT Visit Diagnosis: Other abnormalities of gait and mobility (R26.89);Pain;Unsteadiness on feet (R26.81) Pain - part of body:  (back)     Time: 4098-1191 PT Time Calculation (min) (ACUTE ONLY): 25 min  Charges:  $Gait Training: 8-22 mins $Therapeutic Exercise: 8-22 mins                     Raymond Gurney, PT, DPT Acute Rehabilitation Services  Office: [phone]    Jewel Baize 06/10/2022, 8:45 AM

## 2022-06-10 NOTE — Evaluation (Signed)
Speech Language Pathology Evaluation Patient Details Name: Douglas [name] MRN: [id number] DOB: 1950-05-29 Today's Date: 06/10/2022 Time: 4098-1191 SLP Time Calculation (min) (ACUTE ONLY): 21 min  Problem List:  Patient Active Problem List   Diagnosis Date Noted   Acute ischemic stroke 06/08/2022   Stroke determined by clinical assessment 06/08/2022   Acute pulmonary embolism    Acute urinary retention 02/20/2020   Acute hypoxemic respiratory failure 02/20/2020   Atrial fibrillation with rapid ventricular response 02/20/2020   Heme positive stool 02/20/2020   Microscopic hematuria 02/20/2020   Pneumonia due to COVID-19 virus 02/19/2020   AKI (acute kidney injury) 02/19/2020   Thrombocytopenia 02/19/2020   Rheumatoid arthritis 02/19/2020   Past Medical History:  Past Medical History:  Diagnosis Date   Basal cell carcinoma 01/24/2014   left temple tx mohs Dr Alean Rinne 03/12/2014   Chronic pain    Hypertension    Past Surgical History:  Past Surgical History:  Procedure Laterality Date   BACK SURGERY     x 3   REPLACEMENT TOTAL KNEE BILATERAL     TOTAL HIP ARTHROPLASTY     bilateral   HPI:  Pt is a 72 y.o. male who presented with right-sided weakness and speech difficulty. TNK given. MRI brain negative for acute changes. 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   Assessment / Plan / Recommendation Clinical Impression  Pt participated in speech-language-cognition evaluation. He reported that he lives with his wife, is a retired, and has been independent with medication management prior to admission. Pt stated that he has noticed word retrieval difficulty since retirement and attributed this to his not speaking to people. Per the pt, his speech, language, and cognitive skills are currently at  baseline. Pt's speech and language skills are currently within functional limites with occasional word retrieval difficulty for which he compensates well. Some difficulty was noted with delayed recall, but no other cognitive impairments were noted during informal assessment or formal assessment with components of the [facility] Mental Status Examination. Pt's performance today is representative of his baseline and further acute skilled SLP services are therefore not clinically indicated at this time. Pt was verbalized understanding as well as agreement with results, recommendations, and plan of care.    SLP Assessment  SLP Recommendation/Assessment: Patient does not need any further Speech Lanaguage Pathology Services SLP Visit Diagnosis: Aphasia (R47.01)    Recommendations for follow up therapy are one component of a multi-disciplinary discharge planning process, led by the attending physician.  Recommendations may be updated based on patient status, additional functional criteria and insurance authorization.    Follow Up Recommendations  No SLP follow up    Assistance Recommended at Discharge     Functional Status Assessment Patient has not had a recent decline in their functional status  Frequency and Duration           SLP Evaluation Cognition  Overall Cognitive Status: History of cognitive impairments - at baseline Arousal/Alertness: Awake/alert Orientation Level: Oriented X4 Year: 2024 Day of Week: Correct Attention: Focused;Sustained Focused Attention: Appears intact Sustained Attention: Appears intact Memory: Impaired (Immediate: 5/5; delayed: 3/5; with cues: 2/2) Awareness: Appears intact Problem Solving: Appears intact (Money: 3/3)       Comprehension  Auditory Comprehension Overall Auditory Comprehension: Appears within functional limits for tasks assessed Yes/No Questions: Within Functional Limits Commands: Within Functional Limits Conversation: Complex  Expression Expression Primary Mode of Expression: Verbal Verbal Expression Overall Verbal Expression: Impaired at baseline Initiation: No impairment Level of Generative/Spontaneous Verbalization: Conversation (intermittent word retrieval difficulty) Repetition: No impairment Naming: Impairment Responsive:  (WNL) Pragmatics: No impairment   Oral / Motor  Oral Motor/Sensory Function Overall Oral Motor/Sensory Function: Within functional limits Motor Speech Overall Motor Speech: Appears within functional limits for tasks assessed Respiration: Within functional limits Phonation: Normal Resonance: Within functional limits Articulation: Within functional limitis Intelligibility: Intelligible Motor Planning: Witnin functional limits Motor Speech Errors: Not applicable           Stanisha Lorenz I. Vear Clock, MS, CCC-SLP Acute Rehabilitation Services Office number [phone]  Scheryl Marten 06/10/2022, 10:10 AM

## 2022-06-10 NOTE — TOC Transition Note (Addendum)
Transition of Care [facility]) - CM/SW Discharge Note   Patient Details  Name: Douglas [name] MRN: [id number] Date of Birth: June 14, 1950  [facility] CM/SW Contact:  Kermit Balo, RN Phone Number: 06/10/2022, 1:34 PM   Clinical Narrative:    Pt is discharging home with self care. Pt lives with his spouse that can provide needed supervision.  Walker for home ordered through [employer] and will be delivered to the room. (Pt prefers this over the crutches)  Pt uses oxygen at home. He requires portable tank to get home with and family didn't bring today. Pt is active with [employer]. CM spoke with Barbara Cower at [employer] and he will have a portable tank delivered to the room. Pt also requesting the shoulder bag oxygen. CM has updated Barbara Cower and he will f/u with the patient on an outpatient basis. Pt also states his large oxygen tanks at home are empty. CM updated Barbara Cower with [employer] about this and he will have them replaced.   Wife to provide transportation home.    Final next level of care: Home/Self Care Barriers to Discharge: No Barriers Identified   Patient Goals and CMS Choice   Choice offered to / list presented to : Patient  Discharge Placement                         Discharge Plan and Services Additional resources added to the After Visit Summary for                  DME Arranged: Walker rolling DME Agency: [employer] Date DME Agency Contacted: 06/10/22   Representative spoke with at DME Agency: Barbara Cower            Social Determinants of Health (SDOH) Interventions SDOH Screenings   Food Insecurity: No Food Insecurity (06/09/2022)  Housing: Low Risk  (06/09/2022)  Transportation Needs: No Transportation Needs (06/09/2022)  Utilities: Not At Risk (06/09/2022)  Tobacco Use: Medium Risk (06/08/2022)     Readmission Risk Interventions     No data to display

## 2022-06-10 NOTE — Plan of Care (Signed)
  Problem: Education: Goal: Knowledge of disease or condition will improve Outcome: Progressing   Problem: Ischemic Stroke/TIA Tissue Perfusion: Goal: Complications of ischemic stroke/TIA will be minimized Outcome: Progressing   

## 2022-06-12 ENCOUNTER — Telehealth: Payer: Self-pay

## 2022-06-12 NOTE — Patient Outreach (Signed)
Received a call back from [facility] stroke notification for Mr. Douglas [name]. I have assigned Antionette Fairy, RN to call for follow up and determine if there are any Case Management needs.    Iverson Alamin, Donivan Scull [facility] Care Management Assistant [facility] Care Management [phone]

## 2022-06-12 NOTE — Patient Outreach (Signed)
  [facility] Stroke Care Coordination Follow Up  06/12/2022 Name:  Douglas [name] MRN:  [id number] DOB:  Sep 10, 1950  Subjective: Douglas [name] is a 72 y.o. year old male who is a primary care patient of Pradhan, Sid Falcon, MD. Patient returned EMMI-Stroke call. Spoke with both patient and spouse. Things have been going well. He continues to have some neuropathy pain to feet-taking Lyrica. He has been ambulating and using the cane or walker for assistance. Spouse confirms they have all meds and no issues or concerns regarding them. She will call PCP and [facility] office to make follow up appts today. She confirms she is able to get patient to appts. No RN CM needs or concerns voiced at this time.   Care Coordination Interventions:  Yes, provided    TOC Interventions Today    Flowsheet Row Most Recent Value  TOC Interventions   TOC Interventions Discussed/Reviewed TOC Interventions Discussed, Post discharge activity limitations per provider      Interventions Today    Flowsheet Row Most Recent Value  Chronic Disease   Chronic disease during today's visit Other  [stroke]  General Interventions   General Interventions Discussed/Reviewed General Interventions Discussed, Doctor Visits  Doctor Visits Discussed/Reviewed Doctor Visits Discussed, PCP, Specialist  PCP/Specialist Visits Compliance with follow-up visit  Education Interventions   Education Provided Provided Education  Provided Verbal Education On Nutrition, When to see the doctor, Medication, Other  [post stroke mgmt]  Nutrition Interventions   Nutrition Discussed/Reviewed Nutrition Discussed, Adding fruits and vegetables, Decreasing salt, Increaing proteins  Pharmacy Interventions   Pharmacy Dicussed/Reviewed Pharmacy Topics Discussed, Medications and their functions  Safety Interventions   Safety Discussed/Reviewed Safety Discussed, Fall Risk  [pt using walker or cane to aide in ambulation]       Follow up plan:  Advised caregiver that they would continue to get automated EMMI-Stroke post discharge calls to assess how they are doing following recent hospitalization and will receive a call from a nurse if any of their responses were abnormal. Caregiver voiced understanding and was appreciative of f/u call.   Encounter Outcome:  Pt. Visit Completed    Alessandra Grout  [facility] Care Management Care Management Community Coordinator Direct Phone: [phone] Toll Free: [phone] Fax: [phone]

## 2022-06-16 ENCOUNTER — Other Ambulatory Visit (HOSPITAL_COMMUNITY): Payer: Self-pay

## 2022-06-24 DIAGNOSIS — M069 Rheumatoid arthritis, unspecified: Secondary | ICD-10-CM | POA: Diagnosis not present

## 2022-06-24 DIAGNOSIS — E7800 Pure hypercholesterolemia, unspecified: Secondary | ICD-10-CM | POA: Diagnosis not present

## 2022-06-24 DIAGNOSIS — I10 Essential (primary) hypertension: Secondary | ICD-10-CM | POA: Diagnosis not present

## 2022-06-24 DIAGNOSIS — G459 Transient cerebral ischemic attack, unspecified: Secondary | ICD-10-CM | POA: Diagnosis not present

## 2022-07-06 DIAGNOSIS — J449 Chronic obstructive pulmonary disease, unspecified: Secondary | ICD-10-CM | POA: Diagnosis not present

## 2022-07-20 DIAGNOSIS — H401123 Primary open-angle glaucoma, left eye, severe stage: Secondary | ICD-10-CM | POA: Diagnosis not present

## 2022-07-23 DIAGNOSIS — M4722 Other spondylosis with radiculopathy, cervical region: Secondary | ICD-10-CM | POA: Diagnosis not present

## 2022-07-23 DIAGNOSIS — G894 Chronic pain syndrome: Secondary | ICD-10-CM | POA: Diagnosis not present

## 2022-07-23 DIAGNOSIS — M4726 Other spondylosis with radiculopathy, lumbar region: Secondary | ICD-10-CM | POA: Diagnosis not present

## 2022-07-23 DIAGNOSIS — Z79891 Long term (current) use of opiate analgesic: Secondary | ICD-10-CM | POA: Diagnosis not present

## 2022-08-06 DIAGNOSIS — J449 Chronic obstructive pulmonary disease, unspecified: Secondary | ICD-10-CM | POA: Diagnosis not present

## 2022-08-18 ENCOUNTER — Encounter: Payer: Self-pay | Admitting: Adult Health

## 2022-08-18 ENCOUNTER — Ambulatory Visit: Payer: Medicare Other | Admitting: Adult Health

## 2022-08-18 VITALS — BP 108/65 | HR 60 | Ht 72.0 in | Wt 215.0 lb

## 2022-08-18 DIAGNOSIS — Z9181 History of falling: Secondary | ICD-10-CM | POA: Insufficient documentation

## 2022-08-18 DIAGNOSIS — M810 Age-related osteoporosis without current pathological fracture: Secondary | ICD-10-CM | POA: Insufficient documentation

## 2022-08-18 DIAGNOSIS — E785 Hyperlipidemia, unspecified: Secondary | ICD-10-CM

## 2022-08-18 DIAGNOSIS — Z86711 Personal history of pulmonary embolism: Secondary | ICD-10-CM | POA: Insufficient documentation

## 2022-08-18 DIAGNOSIS — D649 Anemia, unspecified: Secondary | ICD-10-CM | POA: Insufficient documentation

## 2022-08-18 DIAGNOSIS — R350 Frequency of micturition: Secondary | ICD-10-CM | POA: Insufficient documentation

## 2022-08-18 DIAGNOSIS — R519 Headache, unspecified: Secondary | ICD-10-CM | POA: Diagnosis not present

## 2022-08-18 DIAGNOSIS — G4734 Idiopathic sleep related nonobstructive alveolar hypoventilation: Secondary | ICD-10-CM | POA: Insufficient documentation

## 2022-08-18 DIAGNOSIS — E559 Vitamin D deficiency, unspecified: Secondary | ICD-10-CM | POA: Insufficient documentation

## 2022-08-18 DIAGNOSIS — Z8673 Personal history of transient ischemic attack (TIA), and cerebral infarction without residual deficits: Secondary | ICD-10-CM | POA: Diagnosis not present

## 2022-08-18 DIAGNOSIS — Z79899 Other long term (current) drug therapy: Secondary | ICD-10-CM | POA: Insufficient documentation

## 2022-08-18 DIAGNOSIS — S90829A Blister (nonthermal), unspecified foot, initial encounter: Secondary | ICD-10-CM | POA: Insufficient documentation

## 2022-08-18 DIAGNOSIS — M7500 Adhesive capsulitis of unspecified shoulder: Secondary | ICD-10-CM | POA: Insufficient documentation

## 2022-08-18 NOTE — Patient Instructions (Signed)
Your Plan:  Continue ASA 81 mg daily   Blood pressure goal <130/90 Cholesterol LDL goal <70 Diabetes goal A1c <7 Monitor diet and try to exercise   Thank you for coming to see us at [facility]. I hope we have been able to provide you high quality care today.  You may receive a patient satisfaction survey over the next few weeks. We would appreciate your feedback and comments so that we may continue to improve ourselves and the health of our patients.  

## 2022-08-18 NOTE — Progress Notes (Signed)
PATIENT: Douglas [name] DOB: October 11, 1950  REASON FOR VISIT: follow up HISTORY FROM: patient PRIMARY NEUROLOGIST: Dr. Pearlean Brownie  Chief Complaint  Patient presents with   Hospitalization Follow-up    Rm 20. Accompanied by wife. Hospital f/u for CVA. No concerns.     HISTORY OF PRESENT ILLNESS: Today 08/18/22  Douglas [name] is a 72 y.o. male Here for hospital follow-up for stroke. Returns today for follow-up. Feels like his has returned to his baseline. No additional stroke-like symptoms. Remains on ASA. Remains on atorvastatin for cholesterol. Has followed up with PCP since stroke.  He is on supplemental oxygen at night this is managed by his PCP.  He has never had a sleep study.  He does report that he wakes up most mornings with a dull headache.  Headache tends to get better as the day goes on.  He denies snoring.  He returns today for an evaluation.    MRI BRAIN: IMPRESSION: 1. No acute intracranial process. 2. Few cortical and subcortical microhemorrhages in the occipital lobes and cerebellum, which can be seen in the setting of cerebral amyloid angiopathy.  CT head:   IMPRESSION: 1. No evidence of acute large vascular territory infarct or acute hemorrhage. ASPECTS is 10. 2. 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  CTA head and neck:   1. The common carotid and internal carotid arteries are patent within the neck. Atherosclerotic plaque bilaterally, as described. Most notably, atherosclerotic plaque results in 60% stenosis at the origin of the right ICA. 2. Vertebral arteries patent within the neck without hemodynamically significant stenosis. Non-stenotic atherosclerotic plaque bilaterally. 3.  Aortic Atherosclerosis (ICD10-I70.0). 4. Cervical spondylosis, as described. Posterior disc osteophyte complexes contribute to at least moderate spinal canal stenosis at C4-C5, C5-C6 and C6-C7. Multilevel bony neural  foraminal narrowing. 5. Bandlike pulmonary opacity within the inferior aspect of the right upper lobe. This may reflect atelectasis and/or scarring. However, correlate clinically to exclude signs/symptoms that would suggestive pneumonia.   CTA head:   1. No intracranial large vessel occlusion or proximal high-grade arterial stenosis identified. 2. Atherosclerotic plaque within the intracranial internal carotid arteries with no more than mild stenosis.      HISTORY Douglas [name] is a 71 y.o. male with history of hypertension, previous back/neck/hip/knee surgeries, chronic pain, COVID 2023, oxygen requirements at home presenting with presents with acute onset of R sided weakness c/f acute ischemic stroke, now s/p TNK (given 4/22 1546). Transferred to [facility] neuro ICU for close monitoring post TNK and further mgmt. 24hr post-tnk imaging pending.     HOSPITAL COURSE   Strokelike symptoms status post TNK versus TIA   Code Stroke CT head: No evidence of acute large vascular territory infarct or acute hemorrhage.  ASPECTS is 10. 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 Bandlike pulmonary opacity within the inferior aspect of the right upper lobe.   MRI: No acute process.  Few cortical and subcortical microhemorrhages in the occipital lobes and cerebellum   2D Echo: EF 55 to 60%.  Left ventricular grade 1 diastolic dysfunction.  Right ventricular systolic function is moderately reduced, right ventricular is moderately enlarged   LDL 55 HgbA1c 5.7 No antithrombotic prior to admission, now on aspirin 81 mg for secondary stroke prevention  REVIEW OF SYSTEMS: Out of a complete 14 system review of symptoms, the patient complains only of the following symptoms, and  all other reviewed systems are negative.  ALLERGIES: Allergies  Allergen Reactions   Cefazolin Rash    See 08/2012  admission    Morphine Sulfate    Penicillins Hives   Hydrochlorothiazide Rash   Other Rash    Tide detergent   Oxycodone Rash   Vancomycin Rash    Drug rash     HOME MEDICATIONS: Outpatient Medications Prior to Visit  Medication Sig Dispense Refill   aspirin EC 81 MG tablet Take 1 tablet (81 mg total) by mouth daily. Swallow whole. 30 tablet 12   atorvastatin (LIPITOR) 40 MG tablet Take 1 tablet (40 mg total) by mouth at bedtime. 90 tablet 0   brimonidine (ALPHAGAN) 0.2 % ophthalmic solution Place 1 drop into both eyes 2 (two) times daily.     ergocalciferol (VITAMIN D2) 1.25 MG (50000 UT) capsule Take 50,000 Units by mouth once a week.     famotidine (PEPCID) 20 MG tablet Take 20 mg by mouth 2 (two) times daily.     HYDROcodone-acetaminophen (NORCO) 10-325 MG tablet Take 1 tablet by mouth every 4 (four) hours as needed.     lisinopril-hydrochlorothiazide (ZESTORETIC) 10-12.5 MG tablet Take 1 tablet by mouth daily.     pregabalin (LYRICA) 150 MG capsule Take 150 mg by mouth 3 (three) times daily.     sulfaSALAzine (AZULFIDINE) 500 MG tablet Take 4 tablets (2,000 mg total) by mouth 2 (two) times daily as directed (Patient taking differently: Take 1,000 mg by mouth 2 (two) times daily.) 240 tablet 2   timolol (TIMOPTIC) 0.5 % ophthalmic solution Place 1 drop into both eyes daily.     Cholecalciferol (VITAMIN D-3) 5000 UNIT/ML LIQD Take by mouth.     latanoprost (XALATAN) 0.005 % ophthalmic solution Place 1 drop into both eyes as needed.     No facility-administered medications prior to visit.    PAST MEDICAL HISTORY: Past Medical History:  Diagnosis Date   Basal cell carcinoma 01/24/2014   left temple tx mohs Dr Alean Rinne 03/12/2014   Chronic pain    Hypertension     PAST SURGICAL HISTORY: Past Surgical History:  Procedure Laterality Date   BACK SURGERY     x 3   REPLACEMENT TOTAL KNEE BILATERAL     TOTAL HIP ARTHROPLASTY     bilateral    FAMILY HISTORY: History  reviewed. No pertinent family history.  SOCIAL HISTORY: Social History   Socioeconomic History   Marital status: Married    Spouse name: Not on file   Number of children: Not on file   Years of education: Not on file   Highest education level: Not on file  Occupational History   Not on file  Tobacco Use   Smoking status: Former   Smokeless tobacco: Never  Substance and Sexual Activity   Alcohol use: No   Drug use: No   Sexual activity: Not on file  Other Topics Concern   Not on file  Social History Narrative   Not on file   Social Determinants of Health   Financial Resource Strain: Not on file  Food Insecurity: No Food Insecurity (06/09/2022)   Hunger Vital Sign    Worried About Running Out of Food in the Last Year: Never true    Ran Out of Food in the Last Year: Never true  Transportation Needs: No Transportation Needs (06/09/2022)   PRAPARE - Administrator, Civil Service (Medical): No    Lack of Transportation (Non-Medical): No  Physical Activity:  Not on file  Stress: Not on file  Social Connections: Not on file  Intimate Partner Violence: Not At Risk (06/09/2022)   Humiliation, Afraid, Rape, and Kick questionnaire    Fear of Current or Ex-Partner: No    Emotionally Abused: No    Physically Abused: No    Sexually Abused: No      PHYSICAL EXAM  Vitals:   08/18/22 0914  BP: 108/65  Pulse: 60  Weight: 215 lb (97.5 kg)  Height: 6' (1.829 m)   Body mass index is 29.16 kg/m.  Generalized: Well developed, in no acute distress   Neurological examination  Mentation: Alert oriented to time, place, history taking. Follows all commands speech and language fluent Cranial nerve II-XII: Pupils were equal round reactive to light. Extraocular movements were full, visual field were full on confrontational test. Facial sensation and strength were normal. Uvula tongue midline. Head turning and shoulder shrug  were normal and symmetric. Motor: The motor testing  reveals 5 over 5 strength of all 4 extremities. Good symmetric motor tone is noted throughout.  Sensory: Sensory testing is intact to soft touch on all 4 extremities. No evidence of extinction is noted.  Coordination: Cerebellar testing reveals good finger-nose-finger and heel-to-shin bilaterally.  Gait and station: Uses a cane when ambulating.  Gait is slightly unsteady.  Needs assistance with standing.  Slightly stooped posture   DIAGNOSTIC DATA (LABS, IMAGING, TESTING) - I reviewed patient records, labs, notes, testing and imaging myself where available.  Lab Results  Component Value Date   WBC 4.8 06/10/2022   HGB 14.7 06/10/2022   HCT 45.4 06/10/2022   MCV 103.2 (H) 06/10/2022   PLT 85 (L) 06/10/2022      Component Value Date/Time   NA 136 06/10/2022 0329   K 3.9 06/10/2022 0329   CL 91 (L) 06/10/2022 0329   CO2 33 (H) 06/10/2022 0329   GLUCOSE 93 06/10/2022 0329   BUN 14 06/10/2022 0329   CREATININE 0.84 06/10/2022 0329   CALCIUM 9.1 06/10/2022 0329   PROT 5.8 (L) 06/10/2022 0329   ALBUMIN 3.0 (L) 06/10/2022 0329   AST 11 (L) 06/10/2022 0329   ALT 10 06/10/2022 0329   ALKPHOS 46 06/10/2022 0329   BILITOT 0.6 06/10/2022 0329   GFRNONAA >60 06/10/2022 0329   GFRAA 77 (L) 11/24/2011 0112   Lab Results  Component Value Date   CHOL 111 06/09/2022   HDL 35 (L) 06/09/2022   LDLCALC 55 06/09/2022   TRIG 107 06/09/2022   CHOLHDL 3.2 06/09/2022   Lab Results  Component Value Date   HGBA1C 5.7 (H) 06/08/2022   Lab Results  Component Value Date   VITAMINB12 345 02/23/2020   Lab Results  Component Value Date   TSH 0.434 02/20/2020      ASSESSMENT AND PLAN 72 y.o. year old male  has a past medical history of Basal cell carcinoma (01/24/2014), Chronic pain, and Hypertension. here with:  Strokelike symptoms status post TNK versus TIA  Morning headache   Continue aspirin 81 mg daily   for secondary stroke prevention.  Discussed secondary stroke prevention  measures and importance of close PCP follow up for aggressive stroke risk factor management. I have gone over the pathophysiology of stroke, warning signs and symptoms, risk factors and their management in some detail with instructions to go to the closest emergency room for symptoms of concern. HTN: BP goal <130/90.   HLD: LDL goal <70. Recent LDL 55 on Lipitor 40 mg .  DMII: A1c goal<7.0. Recent A1c 5.7.  Encouraged patient to monitor diet and encouraged exercise Discussed referral for sleep evaluation however the patient deferred for now.  Reviewed risk associated with untreated sleep apnea. FU with our office PRN      Butch Penny, MSN, NP-C 08/18/2022, 9:34 AM [facility] [address] [place], [place] [address] [phone]

## 2022-08-19 ENCOUNTER — Inpatient Hospital Stay: Payer: Medicare Other | Admitting: Neurology

## 2022-08-20 NOTE — Progress Notes (Signed)
I agree with the above plan 

## 2022-09-05 DIAGNOSIS — J449 Chronic obstructive pulmonary disease, unspecified: Secondary | ICD-10-CM | POA: Diagnosis not present

## 2022-09-17 DIAGNOSIS — M4722 Other spondylosis with radiculopathy, cervical region: Secondary | ICD-10-CM | POA: Diagnosis not present

## 2022-09-17 DIAGNOSIS — Z79891 Long term (current) use of opiate analgesic: Secondary | ICD-10-CM | POA: Diagnosis not present

## 2022-09-17 DIAGNOSIS — G894 Chronic pain syndrome: Secondary | ICD-10-CM | POA: Diagnosis not present

## 2022-09-17 DIAGNOSIS — M4726 Other spondylosis with radiculopathy, lumbar region: Secondary | ICD-10-CM | POA: Diagnosis not present

## 2022-10-06 DIAGNOSIS — J449 Chronic obstructive pulmonary disease, unspecified: Secondary | ICD-10-CM | POA: Diagnosis not present

## 2022-11-06 DIAGNOSIS — J449 Chronic obstructive pulmonary disease, unspecified: Secondary | ICD-10-CM | POA: Diagnosis not present

## 2022-11-12 DIAGNOSIS — G894 Chronic pain syndrome: Secondary | ICD-10-CM | POA: Diagnosis not present

## 2022-11-12 DIAGNOSIS — M4722 Other spondylosis with radiculopathy, cervical region: Secondary | ICD-10-CM | POA: Diagnosis not present

## 2022-11-12 DIAGNOSIS — M4726 Other spondylosis with radiculopathy, lumbar region: Secondary | ICD-10-CM | POA: Diagnosis not present

## 2022-11-12 DIAGNOSIS — Z79891 Long term (current) use of opiate analgesic: Secondary | ICD-10-CM | POA: Diagnosis not present

## 2022-11-29 ENCOUNTER — Emergency Department (HOSPITAL_COMMUNITY): Payer: Medicare Other

## 2022-11-29 ENCOUNTER — Other Ambulatory Visit: Payer: Self-pay

## 2022-11-29 ENCOUNTER — Inpatient Hospital Stay (HOSPITAL_COMMUNITY)
Admission: EM | Admit: 2022-11-29 | Discharge: 2022-12-02 | DRG: 682 | Disposition: A | Discharge status: Skilled Nursing Facility | Payer: Medicare Other | Attending: Family Medicine | Admitting: Family Medicine

## 2022-11-29 ENCOUNTER — Encounter (HOSPITAL_COMMUNITY): Payer: Self-pay

## 2022-11-29 DIAGNOSIS — I700 Atherosclerosis of aorta: Secondary | ICD-10-CM | POA: Diagnosis not present

## 2022-11-29 DIAGNOSIS — R0902 Hypoxemia: Secondary | ICD-10-CM | POA: Diagnosis present

## 2022-11-29 DIAGNOSIS — Z885 Allergy status to narcotic agent status: Secondary | ICD-10-CM

## 2022-11-29 DIAGNOSIS — R600 Localized edema: Secondary | ICD-10-CM | POA: Diagnosis not present

## 2022-11-29 DIAGNOSIS — Z96643 Presence of artificial hip joint, bilateral: Secondary | ICD-10-CM | POA: Diagnosis present

## 2022-11-29 DIAGNOSIS — S2241XA Multiple fractures of ribs, right side, initial encounter for closed fracture: Secondary | ICD-10-CM | POA: Diagnosis not present

## 2022-11-29 DIAGNOSIS — G8929 Other chronic pain: Secondary | ICD-10-CM | POA: Diagnosis not present

## 2022-11-29 DIAGNOSIS — N289 Disorder of kidney and ureter, unspecified: Secondary | ICD-10-CM | POA: Diagnosis not present

## 2022-11-29 DIAGNOSIS — Z87891 Personal history of nicotine dependence: Secondary | ICD-10-CM

## 2022-11-29 DIAGNOSIS — Z9981 Dependence on supplemental oxygen: Secondary | ICD-10-CM | POA: Diagnosis not present

## 2022-11-29 DIAGNOSIS — R296 Repeated falls: Secondary | ICD-10-CM | POA: Diagnosis not present

## 2022-11-29 DIAGNOSIS — Z96653 Presence of artificial knee joint, bilateral: Secondary | ICD-10-CM | POA: Diagnosis not present

## 2022-11-29 DIAGNOSIS — Z79899 Other long term (current) drug therapy: Secondary | ICD-10-CM | POA: Diagnosis not present

## 2022-11-29 DIAGNOSIS — Z888 Allergy status to other drugs, medicaments and biological substances status: Secondary | ICD-10-CM | POA: Diagnosis not present

## 2022-11-29 DIAGNOSIS — R262 Difficulty in walking, not elsewhere classified: Secondary | ICD-10-CM | POA: Diagnosis not present

## 2022-11-29 DIAGNOSIS — R441 Visual hallucinations: Secondary | ICD-10-CM | POA: Diagnosis not present

## 2022-11-29 DIAGNOSIS — G894 Chronic pain syndrome: Secondary | ICD-10-CM | POA: Diagnosis not present

## 2022-11-29 DIAGNOSIS — G4734 Idiopathic sleep related nonobstructive alveolar hypoventilation: Secondary | ICD-10-CM

## 2022-11-29 DIAGNOSIS — I69351 Hemiplegia and hemiparesis following cerebral infarction affecting right dominant side: Secondary | ICD-10-CM | POA: Diagnosis not present

## 2022-11-29 DIAGNOSIS — E860 Dehydration: Secondary | ICD-10-CM | POA: Diagnosis not present

## 2022-11-29 DIAGNOSIS — E785 Hyperlipidemia, unspecified: Secondary | ICD-10-CM | POA: Diagnosis present

## 2022-11-29 DIAGNOSIS — N179 Acute kidney failure, unspecified: Secondary | ICD-10-CM | POA: Diagnosis not present

## 2022-11-29 DIAGNOSIS — D6959 Other secondary thrombocytopenia: Secondary | ICD-10-CM | POA: Diagnosis not present

## 2022-11-29 DIAGNOSIS — I480 Paroxysmal atrial fibrillation: Secondary | ICD-10-CM

## 2022-11-29 DIAGNOSIS — D696 Thrombocytopenia, unspecified: Secondary | ICD-10-CM

## 2022-11-29 DIAGNOSIS — R32 Unspecified urinary incontinence: Secondary | ICD-10-CM | POA: Diagnosis present

## 2022-11-29 DIAGNOSIS — I2692 Saddle embolus of pulmonary artery without acute cor pulmonale: Secondary | ICD-10-CM | POA: Diagnosis not present

## 2022-11-29 DIAGNOSIS — I5032 Chronic diastolic (congestive) heart failure: Secondary | ICD-10-CM | POA: Diagnosis not present

## 2022-11-29 DIAGNOSIS — R498 Other voice and resonance disorders: Secondary | ICD-10-CM | POA: Diagnosis not present

## 2022-11-29 DIAGNOSIS — M069 Rheumatoid arthritis, unspecified: Secondary | ICD-10-CM | POA: Diagnosis not present

## 2022-11-29 DIAGNOSIS — R918 Other nonspecific abnormal finding of lung field: Secondary | ICD-10-CM | POA: Diagnosis not present

## 2022-11-29 DIAGNOSIS — Z8616 Personal history of COVID-19: Secondary | ICD-10-CM | POA: Diagnosis not present

## 2022-11-29 DIAGNOSIS — I10 Essential (primary) hypertension: Secondary | ICD-10-CM

## 2022-11-29 DIAGNOSIS — R4182 Altered mental status, unspecified: Secondary | ICD-10-CM | POA: Diagnosis not present

## 2022-11-29 DIAGNOSIS — Z881 Allergy status to other antibiotic agents status: Secondary | ICD-10-CM | POA: Diagnosis not present

## 2022-11-29 DIAGNOSIS — I110 Hypertensive heart disease with heart failure: Secondary | ICD-10-CM | POA: Diagnosis not present

## 2022-11-29 DIAGNOSIS — Z85828 Personal history of other malignant neoplasm of skin: Secondary | ICD-10-CM | POA: Diagnosis not present

## 2022-11-29 DIAGNOSIS — Z8249 Family history of ischemic heart disease and other diseases of the circulatory system: Secondary | ICD-10-CM

## 2022-11-29 DIAGNOSIS — R531 Weakness: Secondary | ICD-10-CM | POA: Diagnosis not present

## 2022-11-29 DIAGNOSIS — R278 Other lack of coordination: Secondary | ICD-10-CM | POA: Diagnosis not present

## 2022-11-29 DIAGNOSIS — Z86711 Personal history of pulmonary embolism: Secondary | ICD-10-CM

## 2022-11-29 DIAGNOSIS — M810 Age-related osteoporosis without current pathological fracture: Secondary | ICD-10-CM | POA: Diagnosis not present

## 2022-11-29 DIAGNOSIS — Z880 Allergy status to penicillin: Secondary | ICD-10-CM

## 2022-11-29 DIAGNOSIS — I7143 Infrarenal abdominal aortic aneurysm, without rupture: Secondary | ICD-10-CM | POA: Diagnosis not present

## 2022-11-29 DIAGNOSIS — K5730 Diverticulosis of large intestine without perforation or abscess without bleeding: Secondary | ICD-10-CM | POA: Diagnosis not present

## 2022-11-29 DIAGNOSIS — G9341 Metabolic encephalopathy: Secondary | ICD-10-CM

## 2022-11-29 DIAGNOSIS — I482 Chronic atrial fibrillation, unspecified: Secondary | ICD-10-CM | POA: Diagnosis not present

## 2022-11-29 DIAGNOSIS — I4891 Unspecified atrial fibrillation: Secondary | ICD-10-CM | POA: Diagnosis present

## 2022-11-29 DIAGNOSIS — R410 Disorientation, unspecified: Secondary | ICD-10-CM | POA: Diagnosis not present

## 2022-11-29 DIAGNOSIS — M6281 Muscle weakness (generalized): Secondary | ICD-10-CM | POA: Diagnosis not present

## 2022-11-29 DIAGNOSIS — Z0389 Encounter for observation for other suspected diseases and conditions ruled out: Secondary | ICD-10-CM | POA: Diagnosis not present

## 2022-11-29 DIAGNOSIS — Z9181 History of falling: Secondary | ICD-10-CM | POA: Diagnosis not present

## 2022-11-29 LAB — CBC WITH DIFFERENTIAL/PLATELET
Abs Immature Granulocytes: 0.02 10*3/uL (ref 0.00–0.07)
Basophils Absolute: 0 10*3/uL (ref 0.0–0.1)
Basophils Relative: 1 %
Eosinophils Absolute: 0.1 10*3/uL (ref 0.0–0.5)
Eosinophils Relative: 2 %
HCT: 53.4 % — ABNORMAL HIGH (ref 39.0–52.0)
Hemoglobin: 16.9 g/dL (ref 13.0–17.0)
Immature Granulocytes: 0 %
Lymphocytes Relative: 19 %
Lymphs Abs: 1.1 10*3/uL (ref 0.7–4.0)
MCH: 33.4 pg (ref 26.0–34.0)
MCHC: 31.6 g/dL (ref 30.0–36.0)
MCV: 105.5 fL — ABNORMAL HIGH (ref 80.0–100.0)
Monocytes Absolute: 0.9 10*3/uL (ref 0.1–1.0)
Monocytes Relative: 16 %
Neutro Abs: 3.5 10*3/uL (ref 1.7–7.7)
Neutrophils Relative %: 62 %
Platelets: 65 10*3/uL — ABNORMAL LOW (ref 150–400)
RBC: 5.06 MIL/uL (ref 4.22–5.81)
RDW: 13.4 % (ref 11.5–15.5)
WBC: 5.6 10*3/uL (ref 4.0–10.5)
nRBC: 0 % (ref 0.0–0.2)

## 2022-11-29 LAB — LACTIC ACID, PLASMA
Lactic Acid, Venous: 1.2 mmol/L (ref 0.5–1.9)
Lactic Acid, Venous: 1 mmol/L (ref 0.5–1.9)

## 2022-11-29 LAB — COMPREHENSIVE METABOLIC PANEL
ALT: 17 U/L (ref 0–44)
AST: 16 U/L (ref 15–41)
Albumin: 3.2 g/dL — ABNORMAL LOW (ref 3.5–5.0)
Alkaline Phosphatase: 53 U/L (ref 38–126)
Anion gap: 11 (ref 5–15)
BUN: 46 mg/dL — ABNORMAL HIGH (ref 8–23)
CO2: 33 mmol/L — ABNORMAL HIGH (ref 22–32)
Calcium: 8.7 mg/dL — ABNORMAL LOW (ref 8.9–10.3)
Chloride: 97 mmol/L — ABNORMAL LOW (ref 98–111)
Creatinine, Ser: 2 mg/dL — ABNORMAL HIGH (ref 0.61–1.24)
GFR, Estimated: 35 mL/min — ABNORMAL LOW (ref >60)
Glucose, Bld: 118 mg/dL — ABNORMAL HIGH (ref 70–99)
Potassium: 4.4 mmol/L (ref 3.5–5.1)
Sodium: 141 mmol/L (ref 135–145)
Total Bilirubin: 1 mg/dL (ref 0.3–1.2)
Total Protein: 6.2 g/dL — ABNORMAL LOW (ref 6.5–8.1)

## 2022-11-29 LAB — BLOOD GAS, VENOUS
Acid-Base Excess: 14 mmol/L — ABNORMAL HIGH (ref 0.0–2.0)
Bicarbonate: 41.5 mmol/L — ABNORMAL HIGH (ref 20.0–28.0)
Drawn by: 6892
O2 Saturation: 81 %
Patient temperature: 36.4
pCO2, Ven: 62 mm[Hg] — ABNORMAL HIGH (ref 44–60)
pH, Ven: 7.43 (ref 7.25–7.43)
pO2, Ven: 44 mm[Hg] (ref 32–45)

## 2022-11-29 LAB — URINALYSIS, ROUTINE W REFLEX MICROSCOPIC
Bacteria, UA: NONE SEEN
Bilirubin Urine: NEGATIVE
Glucose, UA: NEGATIVE mg/dL
Ketones, ur: NEGATIVE mg/dL
Leukocytes,Ua: NEGATIVE
Nitrite: NEGATIVE
Protein, ur: 100 mg/dL — AB
Specific Gravity, Urine: 1.015 (ref 1.005–1.030)
pH: 5 (ref 5.0–8.0)

## 2022-11-29 LAB — AMMONIA: Ammonia: 28 umol/L (ref 9–35)

## 2022-11-29 MED ORDER — ACETAMINOPHEN 650 MG RE SUPP: 650.0000 mg | Freq: Four times a day (QID) | RECTAL | Status: DC | PRN

## 2022-11-29 MED ORDER — TIMOLOL MALEATE 0.5 % OP SOLN
1.0000 [drp] | Freq: Two times a day (BID) | OPHTHALMIC | Status: DC
  Administered 2022-11-29 – 2022-12-02 (×6): 1 [drp] via OPHTHALMIC
  Filled 2022-11-29: qty 5

## 2022-11-29 MED ORDER — ACETAMINOPHEN 325 MG PO TABS: 650.0000 mg | ORAL_TABLET | Freq: Four times a day (QID) | ORAL | Status: DC | PRN

## 2022-11-29 MED ORDER — LISINOPRIL 10 MG PO TABS
20.0000 mg | ORAL_TABLET | Freq: Once | ORAL | Status: AC
  Administered 2022-11-29: 20 mg via ORAL
  Filled 2022-11-29: qty 2

## 2022-11-29 MED ORDER — HYDROCODONE-ACETAMINOPHEN 10-325 MG PO TABS
1.0000 | ORAL_TABLET | Freq: Three times a day (TID) | ORAL | Status: DC | PRN
  Administered 2022-11-29 – 2022-12-02 (×4): 1 via ORAL
  Filled 2022-11-29 (×4): qty 1

## 2022-11-29 MED ORDER — LACTATED RINGERS IV SOLN: INTRAVENOUS | Status: AC

## 2022-11-29 MED ORDER — IOHEXOL 300 MG/ML  SOLN
80.0000 mL | Freq: Once | INTRAMUSCULAR | Status: AC | PRN
  Administered 2022-11-29: 80 mL via INTRAVENOUS

## 2022-11-29 MED ORDER — POLYETHYLENE GLYCOL 3350 17 G PO PACK: 17.0000 g | PACK | Freq: Every day | ORAL | Status: DC | PRN

## 2022-11-29 MED ORDER — BRIMONIDINE TARTRATE 0.2 % OP SOLN
1.0000 [drp] | Freq: Two times a day (BID) | OPHTHALMIC | Status: DC
  Administered 2022-11-29 – 2022-12-02 (×6): 1 [drp] via OPHTHALMIC
  Filled 2022-11-29: qty 5

## 2022-11-29 MED ORDER — LABETALOL HCL 5 MG/ML IV SOLN
10.0000 mg | INTRAVENOUS | Status: DC | PRN
  Administered 2022-11-30: 10 mg via INTRAVENOUS
  Filled 2022-11-29: qty 4

## 2022-11-29 MED ORDER — SODIUM CHLORIDE 0.9 % IV BOLUS
1000.0000 mL | Freq: Once | INTRAVENOUS | Status: AC
  Administered 2022-11-29: 1000 mL via INTRAVENOUS

## 2022-11-29 MED ORDER — LORAZEPAM 2 MG/ML IJ SOLN: 0.5000 mg | Freq: Once | INTRAMUSCULAR | Status: DC

## 2022-11-29 NOTE — ED Notes (Signed)
Rectal temp obtained and pt repositioned. Wife at bedside.

## 2022-11-29 NOTE — ED Provider Notes (Addendum)
[place] EMERGENCY DEPARTMENT AT [facility] Provider Note   CSN: [id number] Arrival date & time: 11/29/22  1305     History  Chief Complaint  Patient presents with   Weakness    Douglas [name] is a 72 y.o. male with a history including atrial fibrillation, history of PE, history of CVA with right-sided weakness April 2024 treated with TNK, hypertension, hyperlipidemia and history of chronic pain on narcotic medications including low back and knee pain presenting with a increasing weakness for the past month and a 3-day history of increased confusion with multiple falls in that period of time.  He has complaints of feeling chilled and shaking over the past several days.  He has had multiple falls in the past 3 days where he needed family to help him get up, denies hitting his head, he is not on any blood thinning medications.  He does have complaint of a generalized headache at this time.  Wife at the bedside states he has been increasingly confused and has had poor appetite this week.  For example wife states she woke up early this morning to find he had gotten up and dumped all of his prescription medications all over their bed.  On the way in here today he was talking about seeing alligators in the trees and was talking about lions and bears.  He does endorse dysuria and states his urine has been darker than normal.  He has also noticed lighter than normal stools.  He does not have a history of any hepatic diagnoses.  He has had no treatment for symptoms prior to arrival, no recent changes in his medications including his Lyrica or his pain medications.  His wife endorses he is not missing any narcotic pain pills.  She did state that yesterday morning he was very difficult to wake up even with a sternal rub and he slept several hours beyond his normal waking time.  The history is provided by the patient.       Home Medications Prior to Admission medications   Medication  Sig Start Date End Date Taking? Authorizing Provider  brimonidine (ALPHAGAN) 0.2 % ophthalmic solution Place 1 drop into both eyes 2 (two) times daily. 11/24/16  Yes [provider]  famotidine (PEPCID) 20 MG tablet Take 20 mg by mouth 2 (two) times daily. 09/08/21  Yes [provider]  HYDROcodone-acetaminophen (NORCO) 10-325 MG tablet Take 1 tablet by mouth every 4 (four) hours as needed for severe pain. 07/30/22  Yes [provider]  lisinopril-hydrochlorothiazide (ZESTORETIC) 10-12.5 MG tablet Take 1 tablet by mouth daily. 08/27/21  Yes [provider]  pregabalin (LYRICA) 150 MG capsule Take 150 mg by mouth 3 (three) times daily. 01/15/20  Yes [provider]  sulfaSALAzine (AZULFIDINE) 500 MG tablet Take 4 tablets (2,000 mg total) by mouth 2 (two) times daily as directed Patient taking differently: Take 1,000 mg by mouth 2 (two) times daily. 01/28/22  Yes   timolol (TIMOPTIC) 0.5 % ophthalmic solution Place 1 drop into both eyes 2 (two) times daily. 02/12/20  Yes [provider]      Allergies    Ancef [cefazolin], Penicillins, Firvanq [vancomycin], Hydrochlorothiazide, Ms contin [morphine], Other, and Oxycontin [oxycodone]    Review of Systems   Review of Systems  Constitutional:  Positive for chills.  HENT: Negative.    Respiratory:  Negative for shortness of breath.   Cardiovascular:  Negative for chest pain.  Gastrointestinal: Negative.   Genitourinary:  Positive for enuresis and frequency. Negative for dysuria.  Musculoskeletal:  Positive for back pain.       Chronic back pain  Neurological:  Positive for tremors and weakness.  Psychiatric/Behavioral:  Positive for confusion.     Physical Exam Updated Vital Signs BP (!) 161/88   Pulse 80   Temp 98.1 F (36.7 C) (Oral)   Resp 12   Ht 6\' 2"  (1.88 m)   Wt 95.3 kg   SpO2 (!) 89%   BMI 26.96 kg/m  Physical Exam Vitals and nursing note reviewed.  Constitutional:       Appearance: He is well-developed.  HENT:     Head: Normocephalic and atraumatic.     Mouth/Throat:     Mouth: Mucous membranes are dry.  Eyes:     Conjunctiva/sclera: Conjunctivae normal.  Cardiovascular:     Rate and Rhythm: Normal rate and regular rhythm.     Heart sounds: Normal heart sounds.  Pulmonary:     Effort: Pulmonary effort is normal.     Breath sounds: Normal breath sounds. No wheezing.  Abdominal:     General: Bowel sounds are normal.     Palpations: Abdomen is soft.     Tenderness: There is no abdominal tenderness.  Musculoskeletal:        General: Normal range of motion.     Cervical back: Normal range of motion.     Right lower leg: Edema present.     Left lower leg: Edema present.  Skin:    General: Skin is warm and dry.  Neurological:     General: No focal deficit present.     Mental Status: He is lethargic.     Comments: Drowsy but responds appropriately.  Psychiatric:        Behavior: Behavior is cooperative.     ED Results / Procedures / Treatments   Labs (all labs ordered are listed, but only abnormal results are displayed) Labs Reviewed  CBC WITH DIFFERENTIAL/PLATELET - Abnormal; Notable for the following components:      Result Value   HCT 53.4 (*)    MCV 105.5 (*)    Platelets 65 (*)    All other components within normal limits  COMPREHENSIVE METABOLIC PANEL - Abnormal; Notable for the following components:   Chloride 97 (*)    CO2 33 (*)    Glucose, Bld 118 (*)    BUN 46 (*)    Creatinine, Ser 2.00 (*)    Calcium 8.7 (*)    Total Protein 6.2 (*)    Albumin 3.2 (*)    GFR, Estimated 35 (*)    All other components within normal limits  URINALYSIS, ROUTINE W REFLEX MICROSCOPIC - Abnormal; Notable for the following components:   Hgb urine dipstick MODERATE (*)    Protein, ur 100 (*)    All other components within normal limits  BLOOD GAS, VENOUS - Abnormal; Notable for the following components:   pCO2, Ven 62 (*)    Bicarbonate 41.5  (*)    Acid-Base Excess 14.0 (*)    All other components within normal limits  LACTIC ACID, PLASMA  LACTIC ACID, PLASMA  AMMONIA    EKG None  Radiology CT ABDOMEN PELVIS W CONTRAST  Result Date: 11/29/2022 CLINICAL DATA:  Intermittent altered mental status for the past month. EXAM: CT ABDOMEN AND PELVIS WITH CONTRAST TECHNIQUE: Multidetector CT imaging of the abdomen and pelvis was performed using the standard protocol following bolus administration of intravenous contrast. RADIATION DOSE  REDUCTION: This exam was performed according to the departmental dose-optimization program which includes automated exposure control, adjustment of the mA and/or kV according to patient size and/or use of iterative reconstruction technique. CONTRAST:  80mL OMNIPAQUE IOHEXOL 300 MG/ML  SOLN COMPARISON:  None Available. FINDINGS: Lower chest: No acute abnormality. Subsegmental atelectasis in both posterior lower lobes. Hepatobiliary: No focal liver abnormality is seen. No gallstones, gallbladder wall thickening, or biliary dilatation. Pancreas: Unremarkable. No pancreatic ductal dilatation or surrounding inflammatory changes. Spleen: Normal in size without focal abnormality. Adrenals/Urinary Tract: Adrenal glands are unremarkable. No renal calculi, solid lesion, or hydronephrosis. Delayed excretion of contrast from both kidneys. Bladder is unremarkable. Stomach/Bowel: The stomach is mildly distended. No bowel wall thickening, distention, or surrounding inflammatory changes. Diffuse colonic diverticulosis. Normal appendix. Vascular/Lymphatic: Fusiform 5.2 x 5.1 cm infrarenal abdominal aortic aneurysm. Aortoiliac atherosclerotic vascular disease. No enlarged abdominal or pelvic lymph nodes. Reproductive: Prostate is unremarkable. Other: No free fluid or pneumoperitoneum. 2.1 cm lobulated and 1.9 cm round partially calcified omental lesions in the left abdomen are consistent with old fat necrosis or omental infarct.  Musculoskeletal: No acute or significant osseous findings. Prior bilateral hip arthroplasties. 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 IMPRESSION: 1. No acute intra-abdominal process. 2. Delayed excretion of contrast from both kidneys, suggestive of renal dysfunction. 3. 5.2 cm infrarenal abdominal aortic aneurysm. Recommend follow-up CT or MR as appropriate in 6 months and referral to or continued care with vascular specialist. (Ref.: J Vasc Surg. 2018; 67:2-77 and J Am Coll Radiol 2013;10(10):789-794.) 4.  Aortic Atherosclerosis (ICD10-I70.0). Electronically Signed   By: Obie Dredge M.D.   On: 11/29/2022 17:53   DG Chest Portable 1 View  Result Date: 11/29/2022 CLINICAL DATA:  Fall, confusion. EXAM: PORTABLE CHEST 1 VIEW COMPARISON:  Chest radiograph 02/19/2020. FINDINGS: Low lung volumes accentuate the pulmonary vasculature and cardiomediastinal silhouette. No consolidation. Mild interstitial prominence be due to degree of inspiration or mild interstitial pulmonary edema. No pleural effusion or pneumothorax. Probable free air under the right hemidiaphragm. IMPRESSION: 1. Probable free air under the right hemidiaphragm. Recommend CT abdomen/pelvis. 2. Mild interstitial prominence be due to poor inspiratory effort or mild interstitial pulmonary edema. Critical Value/emergent results were called by telephone at the time of interpretation on 11/29/2022 at 3:53 pm to provider Brenisha Tsui , who verbally acknowledged these results. Electronically Signed   By: Orvan Falconer M.D.   On: 11/29/2022 15:53   CT Head Wo Contrast  Result Date: 11/29/2022 CLINICAL DATA:  Altered mental status. Confusion and weakness off and on for a month. Bilateral arm shaking. EXAM: CT HEAD WITHOUT CONTRAST TECHNIQUE: Contiguous axial images were obtained from the base of the skull through the vertex without intravenous contrast. RADIATION DOSE REDUCTION: This exam was performed according to the departmental  dose-optimization program which includes automated exposure control, adjustment of the mA and/or kV according to patient size and/or use of iterative reconstruction technique. COMPARISON:  CTA head/neck 06/08/2022.  MRI brain 06/09/2022. FINDINGS: Brain: No acute intracranial hemorrhage. 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 Gray-white differentiation is otherwise preserved. No hydrocephalus or extra-axial collection. No mass effect or midline shift. Vascular: No hyperdense vessel or unexpected calcification. Skull: No calvarial fracture or suspicious bone lesion. Skull base is unremarkable. Sinuses/Orbits: No acute finding. Other: None. IMPRESSION: 1. No acute intracranial abnormality. 2. 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 Electronically Signed   By: Orvan Falconer M.D.   On: 11/29/2022 15:47    Procedures Procedures  Medications Ordered in ED Medications  sodium chloride 0.9 % bolus 1,000 mL (1,000 mLs Intravenous New Bag/Given 11/29/22 1544)  iohexol (OMNIPAQUE) 300 MG/ML solution 80 mL (80 mLs Intravenous Contrast Given 11/29/22 1651)  lisinopril (ZESTRIL) tablet 20 mg (20 mg Oral Given 11/29/22 1859)    ED Course/ Medical Decision Making/ A&P                                 Medical Decision Making Patient presenting with confusion, increased weakness and falls, clinically he appears dehydrated and confused.  He endorses dysuria but he does not have a UTI based on today's labs.  He is chronically on oxygen 3 L at night, has been using during the day for exertional activities, his VBG appears relatively stable although he does have an elevated CO2 at 62, his bicarb is 41.5.  He has a normal WBC count at 5.6.  His c-Met is significant for a BUN of 46 and a creatinine of 2.0 reflecting acute kidney injury.  Amount and/or Complexity of Data Reviewed Labs: ordered.    Details: Per above. Radiology: ordered.    Details: Head CT negative for acute findings KUB obtained  given the symptom of confusion, looking for signs of infection, he has no pneumonia but there was a question of right hemidiaphragm free air, therefore CT abdomen pelvis was obtained, no acute findings including no free air. Discussion of management or test interpretation with external provider(s): Discussed with Dr. Mariea Clonts  who accepts patient for admission.  Risk Prescription drug management. Decision regarding hospitalization.           Final Clinical Impression(s) / ED Diagnoses Final diagnoses:  Altered mental status, unspecified altered mental status type  AKI (acute kidney injury) [facility])    Rx / DC Orders ED Discharge Orders     None         Victoriano Lain 11/29/22 1906    Glendora Score, MD 11/30/22 1138    Burgess Amor, PA-C 12/03/22 1709    Kommor, Madison, MD 12/06/22 1151

## 2022-11-29 NOTE — Assessment & Plan Note (Signed)
Platelets 65, chronic thrombocytopenia.  No history of liver disease.  Denies alcohol abuse

## 2022-11-29 NOTE — Assessment & Plan Note (Signed)
Resume home 2-1/2 L of O2.

## 2022-11-29 NOTE — ED Triage Notes (Signed)
Confusion and weakness on and off for a month. B/L arm shaking. Feels cold all the time. Frequent falls per family  Pt feels like he is always urinating on self Poop is grayish in color.  Wears 3LPM O2 all the time  Denies cough and fevers Pain x27 years No new pains

## 2022-11-29 NOTE — Assessment & Plan Note (Addendum)
Reported confused speech, hallucinations, weakness over the past month with multiple falls, urinary incontinence.  On exam 3/5 strength to right lower extremity 5/5 strength to left lower extremity, no other focal deficits.  Head CT shows old right cerebellar infarct no acute abnormality.  Ammonia unremarkable at 28.  Afebrile without leukocytosis.  No signs to suggest infection at this time.  Chest xray, UA, CT A/P- not suggestive of infection.  Reports poor oral intake.  Possibly dehydration, need to rule out stroke. - 1 L bolus given, continue LR 75cc/hr x 12 hrs - MRI brain a.m - PT eval

## 2022-11-29 NOTE — ED Notes (Signed)
ED TO INPATIENT HANDOFF REPORT  ED Nurse Name and Phone #: Jacques Earthly Name/Age/Gender Joellyn Rued 72 y.o. male Room/Bed: APA07/APA07  Code Status   Code Status: Prior  Home/SNF/Other Home Patient oriented to: self, place, time, and situation Is this baseline? Yes   Triage Complete: Triage complete  Chief Complaint Acute metabolic encephalopathy [G93.41]  Triage Note Confusion and weakness on and off for a month. B/L arm shaking. Feels cold all the time. Frequent falls per family  Pt feels like he is always urinating on self Poop is grayish in color.  Wears 3LPM O2 all the time  Denies cough and fevers Pain x27 years No new pains    Allergies Allergies  Allergen Reactions   Ancef [Cefazolin] Rash    See 08/2012 admission    Penicillins Hives   Firvanq [Vancomycin] Rash    Drug rash    Hydrochlorothiazide Rash    Tolerates lisinopril/hctz 10-12.5mg  QD   Ms Contin [Morphine] Rash   Other Rash    Tide detergent   Oxycontin [Oxycodone] Rash    Level of Care/Admitting Diagnosis ED Disposition     ED Disposition  Admit   Condition  --   Comment  Hospital Area: [facility] [[id number]]  Level of Care: Telemetry [5]  Covid Evaluation: Asymptomatic - no recent exposure (last 10 days) testing not required  Diagnosis: Acute metabolic encephalopathy [[id number]]  Admitting Physician: Onnie Boer [[id number]]  Attending Physician: Cresenciano Lick          B Medical/Surgery History Past Medical History:  Diagnosis Date   Basal cell carcinoma 01/24/2014   left temple tx mohs Dr Alean Rinne 03/12/2014   Chronic pain    Hypertension    Past Surgical History:  Procedure Laterality Date   BACK SURGERY     x 3   REPLACEMENT TOTAL KNEE BILATERAL     TOTAL HIP ARTHROPLASTY     bilateral     A IV Location/Drains/Wounds Patient Lines/Drains/Airways Status     Active Line/Drains/Airways     Name Placement date Placement  time Site Days   Peripheral IV 11/29/22 Left Antecubital 11/29/22  1340  Antecubital  less than 1            Intake/Output Last 24 hours  Intake/Output Summary (Last 24 hours) at 11/29/2022 1958 Last data filed at 11/29/2022 1957 Gross per 24 hour  Intake 1000 ml  Output --  Net 1000 ml    Labs/Imaging Results for orders placed or performed during the hospital encounter of 11/29/22 (from the past 48 hour(s))  CBC with Differential     Status: Abnormal   Collection Time: 11/29/22  1:28 PM  Result Value Ref Range   WBC 5.6 4.0 - 10.5 K/uL   RBC 5.06 4.22 - 5.81 MIL/uL   Hemoglobin 16.9 13.0 - 17.0 g/dL   HCT 25.9 (H) 56.3 - 87.5 %   MCV 105.5 (H) 80.0 - 100.0 fL   MCH 33.4 26.0 - 34.0 pg   MCHC 31.6 30.0 - 36.0 g/dL   RDW 64.3 32.9 - 51.8 %   Platelets 65 (L) 150 - 400 K/uL    Comment: SPECIMEN CHECKED FOR CLOTS Immature Platelet Fraction may be clinically indicated, consider ordering this additional test [id number] REPEATED TO VERIFY PLATELET COUNT CONFIRMED BY SMEAR    nRBC 0.0 0.0 - 0.2 %   Neutrophils Relative % 62 %   Neutro Abs 3.5 1.7 - 7.7 K/uL   Lymphocytes Relative  19 %   Lymphs Abs 1.1 0.7 - 4.0 K/uL   Monocytes Relative 16 %   Monocytes Absolute 0.9 0.1 - 1.0 K/uL   Eosinophils Relative 2 %   Eosinophils Absolute 0.1 0.0 - 0.5 K/uL   Basophils Relative 1 %   Basophils Absolute 0.0 0.0 - 0.1 K/uL   WBC Morphology MORPHOLOGY UNREMARKABLE    RBC Morphology MORPHOLOGY UNREMARKABLE    Smear Review PLATELET COUNT CONFIRMED BY SMEAR    Immature Granulocytes 0 %   Abs Immature Granulocytes 0.02 0.00 - 0.07 K/uL    Comment: Performed at [facility], [address] [place], [place] [address]  Comprehensive metabolic panel     Status: Abnormal   Collection Time: 11/29/22  1:28 PM  Result Value Ref Range   Sodium 141 135 - 145 mmol/L   Potassium 4.4 3.5 - 5.1 mmol/L   Chloride 97 (L) 98 - 111 mmol/L   CO2 33 (H) 22 - 32 mmol/L   Glucose, Bld 118 (H) 70 -  99 mg/dL    Comment: Glucose reference range applies only to samples taken after fasting for at least 8 hours.   BUN 46 (H) 8 - 23 mg/dL   Creatinine, Ser 6.04 (H) 0.61 - 1.24 mg/dL   Calcium 8.7 (L) 8.9 - 10.3 mg/dL   Total Protein 6.2 (L) 6.5 - 8.1 g/dL   Albumin 3.2 (L) 3.5 - 5.0 g/dL   AST 16 15 - 41 U/L   ALT 17 0 - 44 U/L   Alkaline Phosphatase 53 38 - 126 U/L   Total Bilirubin 1.0 0.3 - 1.2 mg/dL   GFR, Estimated 35 (L) >60 mL/min    Comment: (NOTE) Calculated using the CKD-EPI Creatinine Equation (2021)    Anion gap 11 5 - 15    Comment: Performed at [facility], [address], [place], [place] [address]  Lactic acid, plasma     Status: None   Collection Time: 11/29/22  2:46 PM  Result Value Ref Range   Lactic Acid, Venous 1.2 0.5 - 1.9 mmol/L    Comment: Performed at [facility], [address], [place], [place] [address]  Ammonia     Status: None   Collection Time: 11/29/22  2:46 PM  Result Value Ref Range   Ammonia 28 9 - 35 umol/L    Comment: HEMOLYSIS AT THIS LEVEL MAY AFFECT RESULT Performed at [facility], [address], [place], [place] [address]   Blood gas, venous (at [facility] and [facility])     Status: Abnormal   Collection Time: 11/29/22  2:46 PM  Result Value Ref Range   pH, Ven 7.43 7.25 - 7.43   pCO2, Ven 62 (H) 44 - 60 mmHg   pO2, Ven 44 32 - 45 mmHg   Bicarbonate 41.5 (H) 20.0 - 28.0 mmol/L   Acid-Base Excess 14.0 (H) 0.0 - 2.0 mmol/L   O2 Saturation 81 %   Patient temperature 36.4    Collection site RIGHT ANTECUBITAL    Drawn by [id number]     Comment: Performed at [facility], [address] [place], [place] [address]  Lactic acid, plasma     Status: None   Collection Time: 11/29/22  4:38 PM  Result Value Ref Range   Lactic Acid, Venous 1.0 0.5 - 1.9 mmol/L    Comment: Performed at [facility], [address] [place], [place] [address]  Urinalysis, Routine w reflex microscopic -Urine, Clean Catch     Status: Abnormal  Collection Time: 11/29/22  6:00 PM   Result Value Ref Range   Color, Urine YELLOW YELLOW   APPearance CLEAR CLEAR   Specific Gravity, Urine 1.015 1.005 - 1.030   pH 5.0 5.0 - 8.0   Glucose, UA NEGATIVE NEGATIVE mg/dL   Hgb urine dipstick MODERATE (A) NEGATIVE   Bilirubin Urine NEGATIVE NEGATIVE   Ketones, ur NEGATIVE NEGATIVE mg/dL   Protein, ur 962 (A) NEGATIVE mg/dL   Nitrite NEGATIVE NEGATIVE   Leukocytes,Ua NEGATIVE NEGATIVE   RBC / HPF 6-10 0 - 5 RBC/hpf   WBC, UA 0-5 0 - 5 WBC/hpf   Bacteria, UA NONE SEEN NONE SEEN   Squamous Epithelial / HPF 0-5 0 - 5 /HPF   Hyaline Casts, UA PRESENT     Comment: Performed at [facility], [address] [place], [place] [address]   CT ABDOMEN PELVIS W CONTRAST  Result Date: 11/29/2022 CLINICAL DATA:  Intermittent altered mental status for the past month. EXAM: CT ABDOMEN AND PELVIS WITH CONTRAST TECHNIQUE: Multidetector CT imaging of the abdomen and pelvis was performed using the standard protocol following bolus administration of intravenous contrast. RADIATION DOSE REDUCTION: This exam was performed according to the departmental dose-optimization program which includes automated exposure control, adjustment of the mA and/or kV according to patient size and/or use of iterative reconstruction technique. CONTRAST:  80mL OMNIPAQUE IOHEXOL 300 MG/ML  SOLN COMPARISON:  None Available. FINDINGS: Lower chest: No acute abnormality. Subsegmental atelectasis in both posterior lower lobes. Hepatobiliary: No focal liver abnormality is seen. No gallstones, gallbladder wall thickening, or biliary dilatation. Pancreas: Unremarkable. No pancreatic ductal dilatation or surrounding inflammatory changes. Spleen: Normal in size without focal abnormality. Adrenals/Urinary Tract: Adrenal glands are unremarkable. No renal calculi, solid lesion, or hydronephrosis. Delayed excretion of contrast from both kidneys. Bladder is unremarkable. Stomach/Bowel: The stomach is mildly distended. No bowel wall thickening,  distention, or surrounding inflammatory changes. Diffuse colonic diverticulosis. Normal appendix. Vascular/Lymphatic: Fusiform 5.2 x 5.1 cm infrarenal abdominal aortic aneurysm. Aortoiliac atherosclerotic vascular disease. No enlarged abdominal or pelvic lymph nodes. Reproductive: Prostate is unremarkable. Other: No free fluid or pneumoperitoneum. 2.1 cm lobulated and 1.9 cm round partially calcified omental lesions in the left abdomen are consistent with old fat necrosis or omental infarct. Musculoskeletal: No acute or significant osseous findings. Prior bilateral hip arthroplasties. 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 IMPRESSION: 1. No acute intra-abdominal process. 2. Delayed excretion of contrast from both kidneys, suggestive of renal dysfunction. 3. 5.2 cm infrarenal abdominal aortic aneurysm. Recommend follow-up CT or MR as appropriate in 6 months and referral to or continued care with vascular specialist. (Ref.: J Vasc Surg. 2018; 67:2-77 and J Am Coll Radiol 2013;10(10):789-794.) 4.  Aortic Atherosclerosis (ICD10-I70.0). Electronically Signed   By: Obie Dredge M.D.   On: 11/29/2022 17:53   DG Chest Portable 1 View  Result Date: 11/29/2022 CLINICAL DATA:  Fall, confusion. EXAM: PORTABLE CHEST 1 VIEW COMPARISON:  Chest radiograph 02/19/2020. FINDINGS: Low lung volumes accentuate the pulmonary vasculature and cardiomediastinal silhouette. No consolidation. Mild interstitial prominence be due to degree of inspiration or mild interstitial pulmonary edema. No pleural effusion or pneumothorax. Probable free air under the right hemidiaphragm. IMPRESSION: 1. Probable free air under the right hemidiaphragm. Recommend CT abdomen/pelvis. 2. Mild interstitial prominence be due to poor inspiratory effort or mild interstitial pulmonary edema. Critical Value/emergent results were called by telephone at the time of interpretation on 11/29/2022 at 3:53 pm to provider JULIE IDOL , who verbally  acknowledged these results.  Electronically Signed   By: Orvan Falconer M.D.   On: 11/29/2022 15:53   CT Head Wo Contrast  Result Date: 11/29/2022 CLINICAL DATA:  Altered mental status. Confusion and weakness off and on for a month. Bilateral arm shaking. EXAM: CT HEAD WITHOUT CONTRAST TECHNIQUE: Contiguous axial images were obtained from the base of the skull through the vertex without intravenous contrast. RADIATION DOSE REDUCTION: This exam was performed according to the departmental dose-optimization program which includes automated exposure control, adjustment of the mA and/or kV according to patient size and/or use of iterative reconstruction technique. COMPARISON:  CTA head/neck 06/08/2022.  MRI brain 06/09/2022. FINDINGS: Brain: No acute intracranial hemorrhage. 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 Gray-white differentiation is otherwise preserved. No hydrocephalus or extra-axial collection. No mass effect or midline shift. Vascular: No hyperdense vessel or unexpected calcification. Skull: No calvarial fracture or suspicious bone lesion. Skull base is unremarkable. Sinuses/Orbits: No acute finding. Other: None. IMPRESSION: 1. No acute intracranial abnormality. 2. 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 Electronically Signed   By: Orvan Falconer M.D.   On: 11/29/2022 15:47    Pending Labs Unresulted Labs (From admission, onward)    None       Vitals/Pain Today's Vitals   11/29/22 1630 11/29/22 1715 11/29/22 1730 11/29/22 1900  BP: (!) 170/100   (!) 161/88  Pulse: 82 88 88 80  Resp: 18 (!) 21 (!) 21 12  Temp:    98.1 F (36.7 C)  TempSrc:    Oral  SpO2: 90% 93% 91% (!) 89%  Weight:      Height:      PainSc:        Isolation Precautions No active isolations  Medications Medications  sodium chloride 0.9 % bolus 1,000 mL (0 mLs Intravenous Stopped 11/29/22 1957)  iohexol (OMNIPAQUE) 300 MG/ML solution 80 mL (80 mLs Intravenous Contrast Given 11/29/22 1651)   lisinopril (ZESTRIL) tablet 20 mg (20 mg Oral Given 11/29/22 1859)    Mobility Ambu at baseline     Focused Assessments    R Recommendations: See Admitting Provider Note  Report given to:   Additional Notes: A&O; Generalized weakness; 20G LAC; 2.5L via [place]

## 2022-11-29 NOTE — Assessment & Plan Note (Signed)
Elevated systolic up to 170s. -Hold lisinopril HCTZ with AKI -As needed labetalol 10 mg for systolic greater than 170.

## 2022-11-29 NOTE — Assessment & Plan Note (Signed)
Cr elevated at 2, baseline 0.8-1.  Likely prerenal from dehydration, considering poor oral intake, he is also on lisinopril HCTZ. -Hydrate, hold Zestoretic

## 2022-11-29 NOTE — H&P (Addendum)
History and Physical    Douglas [name] ATF:[id number] DOB: 05/06/1950 DOA: 11/29/2022  PCP: Elise Benne, MD   Patient coming from: Home  I have personally briefly reviewed patient's old medical records in [facility]  Chief Complaint: AMS  HPI: Douglas [name] is a 72 y.o. male with medical history significant for rheumatoid arthritis, pulmonary embolism, hypertension, nocturnal hypoxemia on 2 to 3 L at night, atrial fibrillation, CVA Patient was brought to the ED multiple complaints - reports of confusion, weakness, multiple falls, change in urine or stool color, urinary incontinence and hallucinations.  Erythema evaluation, patient is awake, able to answer questions.  Reports generalized weakness and difficulty ambulating.  Spouse reported that patient was hallucinating, and getting increasingly confused with poor oral intake over the past week.  On the way here-- patient reported he was seeing alligators and lions on trees.  Also reported difficulty waking patient up yesterday. Patient reports bilateral lower extremity swelling over the past 1 to 2 weeks.  ED Course: Temperature 97.3.  Heart rate 80s to 90s.  Respiratory rate 12-23.  Blood pressure systolic 130s to 270W.  O2 sats 91 to 94% on 2-1/2 L. Creatinine elevated at 2.  Platelets low at 65.  Lactic acid 1.2.  Ammonia level 28.  VBG shows pH of 7.4, pCO2 of 62.  UA not suggestive of infection. Head CT negative for acute abnormality. CT abdomen and pelvis with contrast without acute intra-abdominal process, suggest renal dysfunction, and AAA. 1 L bolus normal saline given. Hospitalist to admit for altered mental status and AKI.  Review of Systems: As per HPI all other systems reviewed and negative.  Past Medical History:  Diagnosis Date   Basal cell carcinoma 01/24/2014   left temple tx mohs Dr Alean Rinne 03/12/2014   Chronic pain    Hypertension     Past Surgical History:  Procedure Laterality Date    BACK SURGERY     x 3   REPLACEMENT TOTAL KNEE BILATERAL     TOTAL HIP ARTHROPLASTY     bilateral     reports that he has quit smoking. He has never used smokeless tobacco. He reports that he does not drink alcohol and does not use drugs.  Allergies  Allergen Reactions   Ancef [Cefazolin] Rash    See 08/2012 admission    Penicillins Hives   Firvanq [Vancomycin] Rash    Drug rash    Hydrochlorothiazide Rash    Tolerates lisinopril/hctz 10-12.5mg  QD   Ms Contin [Morphine] Rash   Other Rash    Tide detergent   Oxycontin [Oxycodone] Rash   Family history of hypertension.  Prior to Admission medications   Medication Sig Start Date End Date Taking? Authorizing Provider  brimonidine (ALPHAGAN) 0.2 % ophthalmic solution Place 1 drop into both eyes 2 (two) times daily. 11/24/16   [provider]  famotidine (PEPCID) 20 MG tablet Take 20 mg by mouth 2 (two) times daily. 09/08/21   [provider]  HYDROcodone-acetaminophen (NORCO) 10-325 MG tablet Take 1 tablet by mouth every 4 (four) hours as needed. 07/30/22   [provider]  lisinopril-hydrochlorothiazide (ZESTORETIC) 10-12.5 MG tablet Take 1 tablet by mouth daily. 08/27/21   [provider]  pregabalin (LYRICA) 150 MG capsule Take 150 mg by mouth 3 (three) times daily. 01/15/20   [provider]  sulfaSALAzine (AZULFIDINE) 500 MG tablet Take 4 tablets (2,000 mg total) by mouth 2 (two) times daily as directed Patient taking differently: Take 1,000  mg by mouth 2 (two) times daily. 01/28/22     timolol (TIMOPTIC) 0.5 % ophthalmic solution Place 1 drop into both eyes daily. 02/12/20   [provider]    Physical Exam: Vitals:   11/29/22 1630 11/29/22 1715 11/29/22 1730 11/29/22 1900  BP: (!) 170/100   (!) 161/88  Pulse: 82 88 88 80  Resp: 18 (!) 21 (!) 21 12  Temp:      TempSrc:      SpO2: 90% 93% 91% (!) 89%  Weight:      Height:        Constitutional: Appears lethargic   calm, comfortable Vitals:   11/29/22 1630 11/29/22 1715 11/29/22 1730 11/29/22 1900  BP: (!) 170/100   (!) 161/88  Pulse: 82 88 88 80  Resp: 18 (!) 21 (!) 21 12  Temp:      TempSrc:      SpO2: 90% 93% 91% (!) 89%  Weight:      Height:       Eyes: PERRL, lids and conjunctivae normal ENMT: Mucous membranes are dry  Neck: normal, supple, no masses, no thyromegaly Respiratory: clear to auscultation bilaterally, no wheezing, no crackles. Normal respiratory effort. No accessory muscle use.  Cardiovascular: Regular rate and rhythm, no murmurs / rubs / gallops.  Trace to 1+ pitting bilateral lower extremity edema to upper leg.  Extremities warm. Abdomen: no tenderness, no masses palpated. No hepatosplenomegaly. Bowel sounds positive.  Musculoskeletal: no clubbing / cyanosis. No joint deformity upper and lower extremities.  Skin: Multiple scratches, and discoloration to upper and lower extremities Neurologic: 3/5 strength right lower extremity, 5 of 5 strength to bilateral upper and left lower extremity no facial asymmetry, speech fluent without aphasia,.  Psychiatric: Normal judgment and insight.  Lethargy, but awake and oriented to person place and situation to some extent, appropriate mood.   Labs on Admission: I have personally reviewed following labs and imaging studies  CBC: Recent Labs  Lab 11/29/22 1328  WBC 5.6  NEUTROABS 3.5  HGB 16.9  HCT 53.4*  MCV 105.5*  PLT 65*   Basic Metabolic Panel: Recent Labs  Lab 11/29/22 1328  NA 141  K 4.4  CL 97*  CO2 33*  GLUCOSE 118*  BUN 46*  CREATININE 2.00*  CALCIUM 8.7*   GFR: Estimated Creatinine Clearance: 39.4 mL/min (A) (by C-G formula based on SCr of 2 mg/dL (H)). Liver Function Tests: Recent Labs  Lab 11/29/22 1328  AST 16  ALT 17  ALKPHOS 53  BILITOT 1.0  PROT 6.2*  ALBUMIN 3.2*   No results for input(s): "LIPASE", "AMYLASE" in the last 168 hours. Recent Labs  Lab 11/29/22 1446  AMMONIA 28   Urine  analysis:    Component Value Date/Time   COLORURINE YELLOW 11/29/2022 1800   APPEARANCEUR CLEAR 11/29/2022 1800   LABSPEC 1.015 11/29/2022 1800   PHURINE 5.0 11/29/2022 1800   GLUCOSEU NEGATIVE 11/29/2022 1800   HGBUR MODERATE (A) 11/29/2022 1800   BILIRUBINUR NEGATIVE 11/29/2022 1800   KETONESUR NEGATIVE 11/29/2022 1800   PROTEINUR 100 (A) 11/29/2022 1800   UROBILINOGEN 1.0 09/15/2007 1008   NITRITE NEGATIVE 11/29/2022 1800   LEUKOCYTESUR NEGATIVE 11/29/2022 1800    Radiological Exams on Admission: CT ABDOMEN PELVIS W CONTRAST  Result Date: 11/29/2022 CLINICAL DATA:  Intermittent altered mental status for the past month. EXAM: CT ABDOMEN AND PELVIS WITH CONTRAST TECHNIQUE: Multidetector CT imaging of the abdomen and pelvis was performed using the standard protocol following bolus administration of  intravenous contrast. RADIATION DOSE REDUCTION: This exam was performed according to the departmental dose-optimization program which includes automated exposure control, adjustment of the mA and/or kV according to patient size and/or use of iterative reconstruction technique. CONTRAST:  80mL OMNIPAQUE IOHEXOL 300 MG/ML  SOLN COMPARISON:  None Available. FINDINGS: Lower chest: No acute abnormality. Subsegmental atelectasis in both posterior lower lobes. Hepatobiliary: No focal liver abnormality is seen. No gallstones, gallbladder wall thickening, or biliary dilatation. Pancreas: Unremarkable. No pancreatic ductal dilatation or surrounding inflammatory changes. Spleen: Normal in size without focal abnormality. Adrenals/Urinary Tract: Adrenal glands are unremarkable. No renal calculi, solid lesion, or hydronephrosis. Delayed excretion of contrast from both kidneys. Bladder is unremarkable. Stomach/Bowel: The stomach is mildly distended. No bowel wall thickening, distention, or surrounding inflammatory changes. Diffuse colonic diverticulosis. Normal appendix. Vascular/Lymphatic: Fusiform 5.2 x 5.1 cm  infrarenal abdominal aortic aneurysm. Aortoiliac atherosclerotic vascular disease. No enlarged abdominal or pelvic lymph nodes. Reproductive: Prostate is unremarkable. Other: No free fluid or pneumoperitoneum. 2.1 cm lobulated and 1.9 cm round partially calcified omental lesions in the left abdomen are consistent with old fat necrosis or omental infarct. Musculoskeletal: No acute or significant osseous findings. Prior bilateral hip arthroplasties. 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 IMPRESSION: 1. No acute intra-abdominal process. 2. Delayed excretion of contrast from both kidneys, suggestive of renal dysfunction. 3. 5.2 cm infrarenal abdominal aortic aneurysm. Recommend follow-up CT or MR as appropriate in 6 months and referral to or continued care with vascular specialist. (Ref.: J Vasc Surg. 2018; 67:2-77 and J Am Coll Radiol 2013;10(10):789-794.) 4.  Aortic Atherosclerosis (ICD10-I70.0). Electronically Signed   By: Obie Dredge M.D.   On: 11/29/2022 17:53   DG Chest Portable 1 View  Result Date: 11/29/2022 CLINICAL DATA:  Fall, confusion. EXAM: PORTABLE CHEST 1 VIEW COMPARISON:  Chest radiograph 02/19/2020. FINDINGS: Low lung volumes accentuate the pulmonary vasculature and cardiomediastinal silhouette. No consolidation. Mild interstitial prominence be due to degree of inspiration or mild interstitial pulmonary edema. No pleural effusion or pneumothorax. Probable free air under the right hemidiaphragm. IMPRESSION: 1. Probable free air under the right hemidiaphragm. Recommend CT abdomen/pelvis. 2. Mild interstitial prominence be due to poor inspiratory effort or mild interstitial pulmonary edema. Critical Value/emergent results were called by telephone at the time of interpretation on 11/29/2022 at 3:53 pm to provider JULIE IDOL , who verbally acknowledged these results. Electronically Signed   By: Orvan Falconer M.D.   On: 11/29/2022 15:53   CT Head Wo Contrast  Result Date:  11/29/2022 CLINICAL DATA:  Altered mental status. Confusion and weakness off and on for a month. Bilateral arm shaking. EXAM: CT HEAD WITHOUT CONTRAST TECHNIQUE: Contiguous axial images were obtained from the base of the skull through the vertex without intravenous contrast. RADIATION DOSE REDUCTION: This exam was performed according to the departmental dose-optimization program which includes automated exposure control, adjustment of the mA and/or kV according to patient size and/or use of iterative reconstruction technique. COMPARISON:  CTA head/neck 06/08/2022.  MRI brain 06/09/2022. FINDINGS: Brain: No acute intracranial hemorrhage. 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 Gray-white differentiation is otherwise preserved. No hydrocephalus or extra-axial collection. No mass effect or midline shift. Vascular: No hyperdense vessel or unexpected calcification. Skull: No calvarial fracture or suspicious bone lesion. Skull base is unremarkable. Sinuses/Orbits: No acute finding. Other: None. IMPRESSION: 1. No acute intracranial abnormality. 2. 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 Electronically Signed   By: Orvan Falconer M.D.   On: 11/29/2022 15:47  EKG: Independently reviewed.  P waves present, in lead V5, read as Atrial flutter, rate 89, QTc 503.  RBBB.    Assessment/Plan Principal Problem:   Acute metabolic encephalopathy Active Problems:   AKI (acute kidney injury) (HCC)   Thrombocytopenia (HCC)   Atrial fibrillation (HCC)   Nocturnal hypoxemia   Rheumatoid arthritis (HCC)   Essential hypertension  Assessment and Plan: * Acute metabolic encephalopathy Reported confused speech, hallucinations, weakness over the past month with multiple falls, urinary incontinence.  On exam 3/5 strength to right lower extremity 5/5 strength to left lower extremity, no other focal deficits.  Head CT shows old right cerebellar infarct no acute abnormality.  Ammonia unremarkable at 28.  Afebrile  without leukocytosis.  No signs to suggest infection at this time.  Chest xray, UA, CT A/P- not suggestive of infection.  Reports poor oral intake.  Possibly dehydration, need to rule out stroke. - 1 L bolus given, continue LR 75cc/hr x 12 hrs - MRI brain a.m - PT eval  AKI (acute kidney injury) (HCC) Cr elevated at 2, baseline 0.8-1.  Likely prerenal from dehydration, considering poor oral intake, he is also on lisinopril HCTZ. -Hydrate, hold Zestoretic  Atrial fibrillation (HCC) EKG with P waves today, mostly regular except for PACs, read as atria fib.  History of transient atrial fibrillation 2022 during hospitalization for COVID-pneumonia, acute PE.  He was not placed on lifelong anticoagulation.  Thrombocytopenia (HCC) Platelets 65, chronic thrombocytopenia.  No history of liver disease.  Denies alcohol abuse  Nocturnal hypoxemia Resume home 2-1/2 L of O2.  Essential hypertension Elevated systolic up to 170s. -Hold lisinopril HCTZ with AKI -As needed labetalol 10 mg for systolic greater than 170.   DVT prophylaxis: SCDS Code Status:  FULL Code-  Family Communication: None at bedside Disposition Plan: ~ 2 days Consults called: None Admission status: Obs tele  I certify that at the point of admission it is my clinical judgment that the patient will require inpatient hospital care spanning beyond 2 midnights from the point of admission due to high intensity of service, high risk for further deterioration and high frequency of surveillance required.     Author: Onnie Boer, MD 11/29/2022 11:18 PM  For on call review [email or web address].

## 2022-11-29 NOTE — Assessment & Plan Note (Signed)
EKG with P waves today, mostly regular except for PACs, read as atria fib.  History of transient atrial fibrillation 2022 during hospitalization for COVID-pneumonia, acute PE.  He was not placed on lifelong anticoagulation.

## 2022-11-30 ENCOUNTER — Encounter (HOSPITAL_COMMUNITY): Payer: Self-pay | Admitting: Internal Medicine

## 2022-11-30 ENCOUNTER — Observation Stay (HOSPITAL_COMMUNITY): Payer: Medicare Other

## 2022-11-30 DIAGNOSIS — I480 Paroxysmal atrial fibrillation: Secondary | ICD-10-CM | POA: Diagnosis not present

## 2022-11-30 DIAGNOSIS — Z96653 Presence of artificial knee joint, bilateral: Secondary | ICD-10-CM | POA: Diagnosis present

## 2022-11-30 DIAGNOSIS — Z86711 Personal history of pulmonary embolism: Secondary | ICD-10-CM | POA: Diagnosis not present

## 2022-11-30 DIAGNOSIS — I69351 Hemiplegia and hemiparesis following cerebral infarction affecting right dominant side: Secondary | ICD-10-CM | POA: Diagnosis not present

## 2022-11-30 DIAGNOSIS — S2241XA Multiple fractures of ribs, right side, initial encounter for closed fracture: Secondary | ICD-10-CM | POA: Diagnosis not present

## 2022-11-30 DIAGNOSIS — Z881 Allergy status to other antibiotic agents status: Secondary | ICD-10-CM | POA: Diagnosis not present

## 2022-11-30 DIAGNOSIS — R32 Unspecified urinary incontinence: Secondary | ICD-10-CM | POA: Diagnosis present

## 2022-11-30 DIAGNOSIS — Z79899 Other long term (current) drug therapy: Secondary | ICD-10-CM | POA: Diagnosis not present

## 2022-11-30 DIAGNOSIS — G9341 Metabolic encephalopathy: Secondary | ICD-10-CM | POA: Diagnosis present

## 2022-11-30 DIAGNOSIS — R296 Repeated falls: Secondary | ICD-10-CM | POA: Diagnosis present

## 2022-11-30 DIAGNOSIS — D6959 Other secondary thrombocytopenia: Secondary | ICD-10-CM | POA: Diagnosis present

## 2022-11-30 DIAGNOSIS — Z0389 Encounter for observation for other suspected diseases and conditions ruled out: Secondary | ICD-10-CM | POA: Diagnosis not present

## 2022-11-30 DIAGNOSIS — Z888 Allergy status to other drugs, medicaments and biological substances status: Secondary | ICD-10-CM | POA: Diagnosis not present

## 2022-11-30 DIAGNOSIS — I700 Atherosclerosis of aorta: Secondary | ICD-10-CM | POA: Diagnosis not present

## 2022-11-30 DIAGNOSIS — E785 Hyperlipidemia, unspecified: Secondary | ICD-10-CM | POA: Diagnosis present

## 2022-11-30 DIAGNOSIS — Z85828 Personal history of other malignant neoplasm of skin: Secondary | ICD-10-CM | POA: Diagnosis not present

## 2022-11-30 DIAGNOSIS — Z96643 Presence of artificial hip joint, bilateral: Secondary | ICD-10-CM | POA: Diagnosis present

## 2022-11-30 DIAGNOSIS — M069 Rheumatoid arthritis, unspecified: Secondary | ICD-10-CM | POA: Diagnosis present

## 2022-11-30 DIAGNOSIS — Z8616 Personal history of COVID-19: Secondary | ICD-10-CM | POA: Diagnosis not present

## 2022-11-30 DIAGNOSIS — G894 Chronic pain syndrome: Secondary | ICD-10-CM | POA: Diagnosis not present

## 2022-11-30 DIAGNOSIS — Z885 Allergy status to narcotic agent status: Secondary | ICD-10-CM | POA: Diagnosis not present

## 2022-11-30 DIAGNOSIS — Z87891 Personal history of nicotine dependence: Secondary | ICD-10-CM | POA: Diagnosis not present

## 2022-11-30 DIAGNOSIS — Z880 Allergy status to penicillin: Secondary | ICD-10-CM | POA: Diagnosis not present

## 2022-11-30 DIAGNOSIS — N179 Acute kidney failure, unspecified: Secondary | ICD-10-CM | POA: Diagnosis present

## 2022-11-30 DIAGNOSIS — I4891 Unspecified atrial fibrillation: Secondary | ICD-10-CM | POA: Diagnosis present

## 2022-11-30 DIAGNOSIS — I110 Hypertensive heart disease with heart failure: Secondary | ICD-10-CM | POA: Diagnosis present

## 2022-11-30 DIAGNOSIS — Z9981 Dependence on supplemental oxygen: Secondary | ICD-10-CM | POA: Diagnosis not present

## 2022-11-30 DIAGNOSIS — E860 Dehydration: Secondary | ICD-10-CM | POA: Diagnosis present

## 2022-11-30 DIAGNOSIS — I5032 Chronic diastolic (congestive) heart failure: Secondary | ICD-10-CM | POA: Diagnosis present

## 2022-11-30 LAB — CBC
HCT: 52.7 % — ABNORMAL HIGH (ref 39.0–52.0)
Hemoglobin: 16.5 g/dL (ref 13.0–17.0)
MCH: 32.9 pg (ref 26.0–34.0)
MCHC: 31.3 g/dL (ref 30.0–36.0)
MCV: 105 fL — ABNORMAL HIGH (ref 80.0–100.0)
Platelets: 69 10*3/uL — ABNORMAL LOW (ref 150–400)
RBC: 5.02 MIL/uL (ref 4.22–5.81)
RDW: 13.6 % (ref 11.5–15.5)
WBC: 5.6 10*3/uL (ref 4.0–10.5)
nRBC: 0 % (ref 0.0–0.2)

## 2022-11-30 LAB — BASIC METABOLIC PANEL
Anion gap: 10 (ref 5–15)
BUN: 41 mg/dL — ABNORMAL HIGH (ref 8–23)
CO2: 36 mmol/L — ABNORMAL HIGH (ref 22–32)
Calcium: 8.5 mg/dL — ABNORMAL LOW (ref 8.9–10.3)
Chloride: 97 mmol/L — ABNORMAL LOW (ref 98–111)
Creatinine, Ser: 1.51 mg/dL — ABNORMAL HIGH (ref 0.61–1.24)
GFR, Estimated: 49 mL/min — ABNORMAL LOW (ref >60)
Glucose, Bld: 100 mg/dL — ABNORMAL HIGH (ref 70–99)
Potassium: 4.3 mmol/L (ref 3.5–5.1)
Sodium: 143 mmol/L (ref 135–145)

## 2022-11-30 MED ORDER — LACTATED RINGERS IV SOLN: INTRAVENOUS | Status: AC

## 2022-11-30 MED ORDER — ASPIRIN 81 MG PO TBEC
81.0000 mg | DELAYED_RELEASE_TABLET | Freq: Every day | ORAL | Status: DC
  Administered 2022-11-30 – 2022-12-02 (×3): 81 mg via ORAL
  Filled 2022-11-30 (×3): qty 1

## 2022-11-30 MED ORDER — HYDRALAZINE HCL 25 MG PO TABS
25.0000 mg | ORAL_TABLET | Freq: Three times a day (TID) | ORAL | Status: DC
  Administered 2022-11-30 – 2022-12-02 (×6): 25 mg via ORAL
  Filled 2022-11-30 (×6): qty 1

## 2022-11-30 MED ORDER — AMLODIPINE BESYLATE 5 MG PO TABS
5.0000 mg | ORAL_TABLET | Freq: Every day | ORAL | Status: DC
  Administered 2022-11-30 – 2022-12-02 (×3): 5 mg via ORAL
  Filled 2022-11-30 (×3): qty 1

## 2022-11-30 MED ORDER — PREGABALIN 50 MG PO CAPS
50.0000 mg | ORAL_CAPSULE | Freq: Two times a day (BID) | ORAL | Status: DC
  Administered 2022-11-30 – 2022-12-02 (×5): 50 mg via ORAL
  Filled 2022-11-30 (×5): qty 1

## 2022-11-30 MED ORDER — ATORVASTATIN CALCIUM 40 MG PO TABS
40.0000 mg | ORAL_TABLET | Freq: Every day | ORAL | Status: DC
  Administered 2022-11-30 – 2022-12-01 (×2): 40 mg via ORAL
  Filled 2022-11-30 (×2): qty 1

## 2022-11-30 NOTE — [place] FL2 (Signed)
[place] MEDICAID FL2 LEVEL OF CARE FORM     IDENTIFICATION  Patient Name: Douglas [name] Birthdate: October 19, 1950 Sex: male Admission Date (Current Location): 11/29/2022  [facility] and [place] Number:  [employer] and Address:  [facility],  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   Anemia 08/18/2022   At risk for falls 08/18/2022   Blister of foot 08/18/2022   High risk medication use 08/18/2022   Hx pulmonary embolism 08/18/2022   Increased frequency of urination 08/18/2022   Nocturnal hypoxemia 08/18/2022   Osteoporosis 08/18/2022   Vitamin D deficiency 08/18/2022   Essential hypertension 06/10/2022   Hyperlipidemia 06/10/2022   Stroke-like symptoms 06/08/2022   Stroke determined by clinical assessment (HCC) 06/08/2022   Acute pulmonary embolism (HCC)    Acute urinary retention 02/20/2020   Acute hypoxemic respiratory failure (HCC) 02/20/2020   Atrial fibrillation (HCC) 02/20/2020   Heme positive stool 02/20/2020   Microscopic hematuria 02/20/2020   Pneumonia due to COVID-19 virus 02/19/2020   AKI (acute kidney injury) (HCC) 02/19/2020   Thrombocytopenia (HCC) 02/19/2020   Rheumatoid arthritis (HCC) 02/19/2020   Encounter for other specified aftercare 02/12/2014   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 08/17/2012   Chronic pain  06/20/2012   DVT (deep venous thrombosis) (HCC) 06/20/2012   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    Orientation RESPIRATION BLADDER Height & Weight     Self, Place, Situation  O2 (4L) External catheter Weight: 210 lb (95.3 kg) Height:  6\' 2"  (188 cm)  BEHAVIORAL SYMPTOMS/MOOD NEUROLOGICAL BOWEL NUTRITION STATUS      Incontinent Diet (See d/c summary)  AMBULATORY STATUS COMMUNICATION OF NEEDS Skin   Extensive Assist Verbally Skin abrasions, Bruising, Other (Comment) (Scratch marks to bilateral legs)                       Personal Care Assistance Level of Assistance  Bathing, Feeding, Dressing Bathing Assistance: Maximum assistance Feeding assistance: Limited assistance Dressing Assistance: Maximum assistance     Functional Limitations Info  Sight, Hearing, Speech Sight Info: Impaired Hearing Info: Adequate Speech Info: Adequate    SPECIAL CARE FACTORS FREQUENCY  PT (By licensed PT)     PT Frequency: 5x weekly              Contractures      Additional Factors Info  Code Status, Allergies Code Status Info: Full code Allergies Info: Ancef (cefazolin), Penicillins, Firvanq (vancomycin), Hydrochlorothiazide, Ms Contin (morphine), Other, Oxycontin (oxycodone)           Current Medications (11/30/2022):  This is the current hospital active medication list Current Facility-Administered Medications  Medication Dose Route Frequency Provider Last Rate Last Admin   acetaminophen (TYLENOL) tablet 650 mg  650 mg Oral Q6H PRN Emokpae, Heloise Beecham, MD  Or   acetaminophen (TYLENOL) suppository 650 mg  650 mg Rectal Q6H PRN Emokpae, Ejiroghene E, MD       brimonidine (ALPHAGAN) 0.2 % ophthalmic solution 1 drop  1 drop Both Eyes BID Emokpae, Ejiroghene E, MD   1 drop at 11/30/22 0830   HYDROcodone-acetaminophen (NORCO) 10-325 MG per tablet 1 tablet  1 tablet Oral Q8H PRN Emokpae, Ejiroghene E, MD    1 tablet at 11/29/22 2239   labetalol (NORMODYNE) injection 10 mg  10 mg Intravenous Q2H PRN Emokpae, Ejiroghene E, MD   10 mg at 11/30/22 0321   lactated ringers infusion   Intravenous Continuous Johnson, Clanford L, MD       LORazepam (ATIVAN) injection 0.5 mg  0.5 mg Intravenous Once Emokpae, Ejiroghene E, MD       polyethylene glycol (MIRALAX / GLYCOLAX) packet 17 g  17 g Oral Daily PRN Emokpae, Ejiroghene E, MD       pregabalin (LYRICA) capsule 50 mg  50 mg Oral BID Johnson, Clanford L, MD       timolol (TIMOPTIC) 0.5 % ophthalmic solution 1 drop  1 drop Both Eyes BID Emokpae, Ejiroghene E, MD   1 drop at 11/30/22 5409     Discharge Medications: Please see discharge summary for a list of discharge medications.  Relevant Imaging Results:  Relevant Lab Results:   Additional Information SSN: [id number]  Karn Cassis, LCSW

## 2022-11-30 NOTE — TOC Initial Note (Signed)
Transition of Care [facility]) - Initial/Assessment Note    Patient Details  Name: Douglas [name] MRN: [id number] Date of Birth: October 17, 1950  [facility]) CM/SW Contact:    Karn Cassis, LCSW Phone Number: 11/30/2022, 12:25 PM  Clinical Narrative: Pt admitted due to acute metabolic encephalopathy. PT evaluated pt and recommend SNF. LCSW discussed with pt's wife who is agreeable, requesting [facility]. Will send referral and start authorization. Reviewed Medicare.gov ratings.                   Expected Discharge Plan: Skilled Nursing Facility Barriers to Discharge: Continued Medical Work up   Patient Goals and CMS Choice Patient states their goals for this hospitalization and ongoing recovery are:: SNF   Choice offered to / list presented to : Spouse [place] ownership interest in [facility].provided to:: Spouse    Expected Discharge Plan and Services In-house Referral: Clinical Social Work   Post Acute Care Choice: Skilled Nursing Facility Living arrangements for the past 2 months: Single Family Home                                      Prior Living Arrangements/Services Living arrangements for the past 2 months: Single Family Home Lives with:: Spouse Patient language and need for interpreter reviewed:: Yes Do you feel safe going back to the place where you live?: Yes        Care giver support system in place?: Yes (comment) Current home services: DME (cane, walker, O2) Criminal Activity/Legal Involvement Pertinent to Current Situation/Hospitalization: No - Comment as needed  Activities of Daily Living   ADL Screening (condition at time of admission) Independently performs ADLs?: No Does the patient have a NEW difficulty with bathing/dressing/toileting/self-feeding that is expected to last >3 days?: No Does the patient have a NEW difficulty with getting in/out of bed, walking, or climbing stairs that is expected to  last >3 days?: Yes (Initiates electronic notice to provider for possible PT consult) Does the patient have a NEW difficulty with communication that is expected to last >3 days?: No Is the patient deaf or have difficulty hearing?: No Does the patient have difficulty seeing, even when wearing glasses/contacts?: No Does the patient have difficulty concentrating, remembering, or making decisions?: Yes  Permission Sought/Granted                  Emotional Assessment         Alcohol / Substance Use: Not Applicable Psych Involvement: No (comment)  Admission diagnosis:  AKI (acute kidney injury) (HCC) [N17.9] Altered mental status, unspecified altered mental status type [R41.82] Acute metabolic encephalopathy [G93.41] Patient Active Problem List   Diagnosis Date Noted   Acute metabolic encephalopathy 11/29/2022   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   Anemia 08/18/2022   At risk for falls 08/18/2022   Blister of foot 08/18/2022   High risk medication use 08/18/2022   Hx pulmonary embolism 08/18/2022   Increased frequency of urination 08/18/2022   Nocturnal hypoxemia 08/18/2022   Osteoporosis 08/18/2022   Vitamin D deficiency 08/18/2022   Essential hypertension 06/10/2022   Hyperlipidemia 06/10/2022   Stroke-like symptoms 06/08/2022   Stroke determined by clinical assessment (HCC) 06/08/2022   Acute pulmonary embolism (HCC)    Acute urinary retention 02/20/2020   Acute hypoxemic respiratory failure (HCC) 02/20/2020   Atrial fibrillation (HCC) 02/20/2020   Heme positive stool  02/20/2020   Microscopic hematuria 02/20/2020   Pneumonia due to COVID-19 virus 02/19/2020   AKI (acute kidney injury) (HCC) 02/19/2020   Thrombocytopenia (HCC) 02/19/2020   Rheumatoid arthritis (HCC) 02/19/2020   Encounter for other specified aftercare 02/12/2014   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 with presence of  antibiotic-impregnated cement spacer 08/17/2012   Chronic pain 06/20/2012   DVT (deep venous thrombosis) (HCC) 06/20/2012   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   PCP:  Elise Benne, MD Pharmacy:   [facility] [phone] [place], [place] - [address] AT [facility] [address] [place] [address] Phone: [phone] Fax: [phone]     Social Determinants of Health (SDOH) Social History: SDOH Screenings   Food Insecurity: No Food Insecurity (11/29/2022)  Housing: Low Risk  (11/29/2022)  Transportation Needs: No Transportation Needs (11/29/2022)  Utilities: Not At Risk (11/29/2022)  Tobacco Use: Medium Risk (11/30/2022)   SDOH Interventions:     Readmission Risk Interventions     No data to display

## 2022-11-30 NOTE — Evaluation (Signed)
Physical Therapy Evaluation Patient Details Name: Douglas [name] MRN: [id number] DOB: 08-11-1950 Today's Date: 11/30/2022  History of Present Illness  Douglas [name] is a 72 y.o. male with medical history significant for rheumatoid arthritis, pulmonary embolism, hypertension, nocturnal hypoxemia on 2 to 3 L at night, atrial fibrillation, CVA  Patient was brought to the ED multiple complaints - reports of confusion, weakness, multiple falls, change in urine or stool color, urinary incontinence and hallucinations.  Erythema evaluation, patient is awake, able to answer questions.  Reports generalized weakness and difficulty ambulating.  Spouse reported that patient was hallucinating, and getting increasingly confused with poor oral intake over the past week.  On the way here-- patient reported he was seeing alligators and lions on trees.  Also reported difficulty waking patient up yesterday.  Patient reports bilateral lower extremity swelling over the past 1 to 2 weeks.   Clinical Impression  Patient demonstrates slow labored movement for sitting up at bedside, had most difficulty completing sit to stands and tends to drop into chair without reaching behinds during stand to sitting.  Patient ambulate in room with slow labored cadence, flexed trunk while on 3 LPM with SpO2 at 90% and tolerated sitting up in chair after therapy - nursing staff notified.  Patient will benefit from continued skilled physical therapy in hospital and recommended venue below to increase strength, balance, endurance for safe ADLs and gait.          If plan is discharge home, recommend the following: A little help with bathing/dressing/bathroom;Help with stairs or ramp for entrance;Assist for transportation;A lot of help with walking and/or transfers   Can travel by private vehicle   Yes    Equipment Recommendations BSC/3in1  Recommendations for Other Services       Functional Status Assessment Patient  has had a recent decline in their functional status and demonstrates the ability to make significant improvements in function in a reasonable and predictable amount of time.     Precautions / Restrictions Precautions Precautions: Fall Restrictions Weight Bearing Restrictions: No      Mobility  Bed Mobility Overal bed mobility: Needs Assistance Bed Mobility: Supine to Sit     Supine to sit: Min assist     General bed mobility comments: slow labored movement with difficulty moving BLE due to weakness    Transfers Overall transfer level: Needs assistance Equipment used: Rolling walker (2 wheels) Transfers: Sit to/from Stand, Bed to chair/wheelchair/BSC Sit to Stand: Min assist, Mod assist   Step pivot transfers: Min assist, Mod assist       General transfer comment: unsteady labored movement with tendency to flop into chair    Ambulation/Gait Ambulation/Gait assistance: Min assist Gait Distance (Feet): 15 Feet Assistive device: Rollator (4 wheels) Gait Pattern/deviations: Decreased step length - right, Decreased step length - left, Decreased stride length, Trunk flexed Gait velocity: decreased     General Gait Details: slow labored cadence with flexed trunk, limited mostly due to fatigue, on 3 LPM O2 with SpO2 at 91%  Stairs            Wheelchair Mobility     Tilt Bed    Modified Rankin (Stroke Patients Only)       Balance Overall balance assessment: Needs assistance Sitting-balance support: Feet supported, No upper extremity supported Sitting balance-Leahy Scale: Fair Sitting balance - Comments: fair/good seated at EOB   Standing balance support: During functional activity, Reliant on assistive device for balance, Bilateral upper extremity supported Standing balance-Leahy  Scale: Poor Standing balance comment: fair/poor using RW                             Pertinent Vitals/Pain Pain Assessment Pain Assessment: No/denies pain     Home Living Family/patient expects to be discharged to:: Private residence Living Arrangements: Spouse/significant other Available Help at Discharge: Family Type of Home: House Home Access: Level entry       Home Layout: Two level;Able to live on main level with bedroom/bathroom;Laundry or work area in [employer] Equipment: Gilmer Mor - single point;Insurance risk surveyor (2 wheels) Additional Comments: wears oxygen at night; lift chair    Prior Function Prior Level of Function : Independent/Modified Independent;Driving             Mobility Comments: household and short distanced community ambulator using RW or SPC, occasionally drives ADLs Comments: Assisted by family     Extremity/Trunk Assessment   Upper Extremity Assessment Upper Extremity Assessment: Generalized weakness    Lower Extremity Assessment Lower Extremity Assessment: Generalized weakness    Cervical / Trunk Assessment Cervical / Trunk Assessment: Kyphotic  Communication   Communication Communication: No apparent difficulties  Cognition Arousal: Alert Behavior During Therapy: WFL for tasks assessed/performed Overall Cognitive Status: Within Functional Limits for tasks assessed                                          General Comments      Exercises     Assessment/Plan    PT Assessment Patient needs continued PT services  PT Problem List Decreased strength;Decreased activity tolerance;Decreased balance;Decreased mobility       PT Treatment Interventions DME instruction;Gait training;Stair training;Functional mobility training;Therapeutic activities;Therapeutic exercise;Balance training;Patient/family education    PT Goals (Current goals can be found in the Care Plan section)  Acute Rehab PT Goals Patient Stated Goal: return home with family to assist PT Goal Formulation: With patient Time For Goal Achievement: 12/14/22 Potential to Achieve Goals: Good     Frequency Min 3X/week     Co-evaluation               AM-PAC PT "6 Clicks" Mobility  Outcome Measure Help needed turning from your back to your side while in a flat bed without using bedrails?: A Little Help needed moving from lying on your back to sitting on the side of a flat bed without using bedrails?: A Little Help needed moving to and from a bed to a chair (including a wheelchair)?: A Lot Help needed standing up from a chair using your arms (e.g., wheelchair or bedside chair)?: A Lot Help needed to walk in hospital room?: A Little Help needed climbing 3-5 steps with a railing? : A Lot 6 Click Score: 15    End of Session Equipment Utilized During Treatment: Oxygen Activity Tolerance: Patient tolerated treatment well;Patient limited by fatigue Patient left: in chair;with call bell/phone within reach Nurse Communication: Mobility status PT Visit Diagnosis: Unsteadiness on feet (R26.81);Other abnormalities of gait and mobility (R26.89);Muscle weakness (generalized) (M62.81)    Time: 0865-7846 PT Time Calculation (min) (ACUTE ONLY): 32 min   Charges:   PT Evaluation $PT Eval Moderate Complexity: 1 Mod PT Treatments $Therapeutic Activity: 23-37 mins PT General Charges $$ ACUTE PT VISIT: 1 Visit         2:31 PM, 11/30/22 Ocie Bob, MPT Physical Therapist  with [facility] [phone] office [phone] mobile phone

## 2022-11-30 NOTE — Progress Notes (Signed)
PRN Labetalol effective. BP now 146/86 (103).

## 2022-11-30 NOTE — Hospital Course (Signed)
71 y.o. male with medical history significant for rheumatoid arthritis, pulmonary embolism, hypertension, nocturnal hypoxemia on 2 to 3 L at night, atrial fibrillation, CVA Patient was brought to the ED multiple complaints - reports of confusion, weakness, multiple falls, change in urine or stool color, urinary incontinence and hallucinations.  Erythema evaluation, patient is awake, able to answer questions.  Reports generalized weakness and difficulty ambulating.  Spouse reported that patient was hallucinating, and getting increasingly confused with poor oral intake over the past week.  On the way here-- patient reported he was seeing alligators and lions on trees.  Also reported difficulty waking patient up yesterday.  Patient reports bilateral lower extremity swelling over the past 1 to 2 weeks.   ED Course: Temperature 97.3.  Heart rate 80s to 90s.  Respiratory rate 12-23.  Blood pressure systolic 130s to 161W.  O2 sats 91 to 94% on 2-1/2 L. Creatinine elevated at 2.  Platelets low at 65.  Lactic acid 1.2.  Ammonia level 28.  VBG shows pH of 7.4, pCO2 of 62.  UA not suggestive of infection. Head CT negative for acute abnormality.  CT abdomen and pelvis with contrast without acute intra-abdominal process, suggest renal dysfunction, and AAA. 1 L bolus normal saline given.  Hospitalist to admit for altered mental status and AKI.

## 2022-11-30 NOTE — Progress Notes (Signed)
   11/30/22 0310  Vitals  Temp 97.8 F (36.6 C)  Temp Source Oral  BP (!) 183/90  MAP (mmHg) 115  BP Location Right Arm  BP Method Automatic  Patient Position (if appropriate) Lying  Pulse Rate 83  Pulse Rate Source Dinamap  MEWS COLOR  MEWS Score Color Green  Oxygen Therapy  SpO2 92 %  O2 Device Nasal Cannula  MEWS Score  MEWS Temp 0  MEWS Systolic 0  MEWS Pulse 0  MEWS RR 0  MEWS LOC 0  MEWS Score 0    PRN Labetalol given.

## 2022-11-30 NOTE — Progress Notes (Signed)
PROGRESS NOTE   Douglas [name]  WJX:[id number] DOB: January 03, 1951 DOA: 11/29/2022 PCP: Elise Benne, MD   Chief Complaint  Patient presents with   Weakness   Level of care: Telemetry  Brief Admission History:   72 y.o. male with medical history significant for rheumatoid arthritis, pulmonary embolism, hypertension, nocturnal hypoxemia on 2 to 3 L at night, atrial fibrillation, CVA Patient was brought to the ED multiple complaints - reports of confusion, weakness, multiple falls, change in urine or stool color, urinary incontinence and hallucinations.  Erythema evaluation, patient is awake, able to answer questions.  Reports generalized weakness and difficulty ambulating.  Spouse reported that patient was hallucinating, and getting increasingly confused with poor oral intake over the past week.  On the way here-- patient reported he was seeing alligators and lions on trees.  Also reported difficulty waking patient up yesterday.  Patient reports bilateral lower extremity swelling over the past 1 to 2 weeks.   ED Course: Temperature 97.3.  Heart rate 80s to 90s.  Respiratory rate 12-23.  Blood pressure systolic 130s to 213Y.  O2 sats 91 to 94% on 2-1/2 L. Creatinine elevated at 2.  Platelets low at 65.  Lactic acid 1.2.  Ammonia level 28.  VBG shows pH of 7.4, pCO2 of 62.  UA not suggestive of infection. Head CT negative for acute abnormality.  CT abdomen and pelvis with contrast without acute intra-abdominal process, suggest renal dysfunction, and AAA. 1 L bolus normal saline given.  Hospitalist to admit for altered mental status and AKI.   Assessment and Plan:  Acute metabolic encephalopathy Reported confused speech, hallucinations, weakness over the past month with multiple falls, urinary incontinence.  On exam 3/5 strength to right lower extremity 5/5 strength to left lower extremity, no other focal deficits.  Head CT shows old right cerebellar infarct no acute abnormality.   Ammonia unremarkable at 28.  Afebrile without leukocytosis.  No signs to suggest infection at this time.  Chest xray, UA, CT A/P- not suggestive of infection.  Reports poor oral intake.  Possibly dehydration, need to rule out stroke. - secondary to AKI/dehydration, treated with IV fluid hydration  - MRI brain still pending  - PT eval completed   Nocturnal hypoxemia Resume home 2-1/2 L of O2  Essential hypertension Elevated systolic up to 170s. -Hold lisinopril HCTZ with AKI -As needed labetalol 10 mg for systolic greater than 170.  Remote history of Atrial fibrillation  History of transient atrial fibrillation 2022 during hospitalization for COVID-pneumonia, acute PE.  He was not placed on lifelong anticoagulation. -telemetry now showing sinus rhythm, continue to follow, may need a holter monitor at discharge  -EKG in AM   Thrombocytopenia  Platelets 65, chronic thrombocytopenia.  No history of liver disease.  Denies alcohol abuse -recheck CBC in AM   AKI (acute kidney injury) Cr elevated at 2, baseline 0.8-1.  Likely prerenal from dehydration, considering poor oral intake, he is also on lisinopril HCTZ. -improving with IV fluids, continue to hold Zestoretic  DVT prophylaxis: SCDs Code Status: Full  Family Communication:  Disposition: Status is: Inpatient   Consultants:   Procedures:   Antimicrobials:    Subjective: Pt much less confused today.    Objective: Vitals:   11/30/22 0338 11/30/22 0408 11/30/22 0922 11/30/22 1305  BP:  (!) 146/86 (!) 143/87 (!) 149/89  Pulse:  86 81 81  Resp:   20   Temp:  99 F (37.2 C) 99.2 F (37.3 C) 97.6 F (36.4  C)  TempSrc:  Oral  Oral  SpO2: 90% 93% (!) 89% 93%  Weight:      Height:        Intake/Output Summary (Last 24 hours) at 11/30/2022 1505 Last data filed at 11/30/2022 1300 Gross per 24 hour  Intake 1420.93 ml  Output 450 ml  Net 970.93 ml   Filed Weights   11/29/22 1310  Weight: 95.3 kg    Examination:  General exam: awake, alert, cooperative, NAD, Appears calm and comfortable  Respiratory system: no increased work of breathing. Respiratory effort normal. Cardiovascular system: normal S1 & S2 heard. No JVD, murmurs, rubs, gallops or clicks. No pedal edema. Gastrointestinal system: Abdomen is nondistended, soft and nontender. No organomegaly or masses felt. Normal bowel sounds heard. Central nervous system: Alert and oriented x3. No focal neurological deficits. Extremities: Symmetric 5 x 5 power. Skin: No rashes, lesions or ulcers. Psychiatry: Judgement and insight appear normal. Mood & affect appropriate.   Data Reviewed: I have personally reviewed following labs and imaging studies  CBC: Recent Labs  Lab 11/29/22 1328 11/30/22 0423  WBC 5.6 5.6  NEUTROABS 3.5  --   HGB 16.9 16.5  HCT 53.4* 52.7*  MCV 105.5* 105.0*  PLT 65* 69*    Basic Metabolic Panel: Recent Labs  Lab 11/29/22 1328 11/30/22 0423  NA 141 143  K 4.4 4.3  CL 97* 97*  CO2 33* 36*  GLUCOSE 118* 100*  BUN 46* 41*  CREATININE 2.00* 1.51*  CALCIUM 8.7* 8.5*    CBG: No results for input(s): "GLUCAP" in the last 168 hours.  No results found for this or any previous visit (from the past 240 hour(s)).   Radiology Studies: DG CHEST PORT 1 VIEW  Result Date: 11/30/2022 CLINICAL DATA:  Suspected pneumonia, hypoxia EXAM: PORTABLE CHEST 1 VIEW COMPARISON:  Chest radiograph 1 day prior FINDINGS: The cardiomediastinal silhouette is stable Lung volumes are low, unchanged. Mildly coarsened interstitial markings in both lungs stable. There is no new or worsening focal airspace disease. There is no definite overt pulmonary edema. There is no significant pleural effusion. There is no pneumothorax There is no acute osseous abnormality. Remote right-sided rib fractures are again noted. IMPRESSION: Stable chest with no radiographic evidence of acute cardiopulmonary process. Electronically Signed   By:  Lesia Hausen M.D.   On: 11/30/2022 12:15   CT ABDOMEN PELVIS W CONTRAST  Result Date: 11/29/2022 CLINICAL DATA:  Intermittent altered mental status for the past month. EXAM: CT ABDOMEN AND PELVIS WITH CONTRAST TECHNIQUE: Multidetector CT imaging of the abdomen and pelvis was performed using the standard protocol following bolus administration of intravenous contrast. RADIATION DOSE REDUCTION: This exam was performed according to the departmental dose-optimization program which includes automated exposure control, adjustment of the mA and/or kV according to patient size and/or use of iterative reconstruction technique. CONTRAST:  80mL OMNIPAQUE IOHEXOL 300 MG/ML  SOLN COMPARISON:  None Available. FINDINGS: Lower chest: No acute abnormality. Subsegmental atelectasis in both posterior lower lobes. Hepatobiliary: No focal liver abnormality is seen. No gallstones, gallbladder wall thickening, or biliary dilatation. Pancreas: Unremarkable. No pancreatic ductal dilatation or surrounding inflammatory changes. Spleen: Normal in size without focal abnormality. Adrenals/Urinary Tract: Adrenal glands are unremarkable. No renal calculi, solid lesion, or hydronephrosis. Delayed excretion of contrast from both kidneys. Bladder is unremarkable. Stomach/Bowel: The stomach is mildly distended. No bowel wall thickening, distention, or surrounding inflammatory changes. Diffuse colonic diverticulosis. Normal appendix. Vascular/Lymphatic: Fusiform 5.2 x 5.1 cm infrarenal abdominal aortic aneurysm. Aortoiliac atherosclerotic  vascular disease. No enlarged abdominal or pelvic lymph nodes. Reproductive: Prostate is unremarkable. Other: No free fluid or pneumoperitoneum. 2.1 cm lobulated and 1.9 cm round partially calcified omental lesions in the left abdomen are consistent with old fat necrosis or omental infarct. Musculoskeletal: No acute or significant osseous findings. Prior bilateral hip arthroplasties. 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 IMPRESSION: 1. No acute intra-abdominal process. 2. Delayed excretion of contrast from both kidneys, suggestive of renal dysfunction. 3. 5.2 cm infrarenal abdominal aortic aneurysm. Recommend follow-up CT or MR as appropriate in 6 months and referral to or continued care with vascular specialist. (Ref.: J Vasc Surg. 2018; 67:2-77 and J Am Coll Radiol 2013;10(10):789-794.) 4.  Aortic Atherosclerosis (ICD10-I70.0). Electronically Signed   By: Obie Dredge M.D.   On: 11/29/2022 17:53   DG Chest Portable 1 View  Result Date: 11/29/2022 CLINICAL DATA:  Fall, confusion. EXAM: PORTABLE CHEST 1 VIEW COMPARISON:  Chest radiograph 02/19/2020. FINDINGS: Low lung volumes accentuate the pulmonary vasculature and cardiomediastinal silhouette. No consolidation. Mild interstitial prominence be due to degree of inspiration or mild interstitial pulmonary edema. No pleural effusion or pneumothorax. Probable free air under the right hemidiaphragm. IMPRESSION: 1. Probable free air under the right hemidiaphragm. Recommend CT abdomen/pelvis. 2. Mild interstitial prominence be due to poor inspiratory effort or mild interstitial pulmonary edema. Critical Value/emergent results were called by telephone at the time of interpretation on 11/29/2022 at 3:53 pm to provider JULIE IDOL , who verbally acknowledged these results. Electronically Signed   By: Orvan Falconer M.D.   On: 11/29/2022 15:53   CT Head Wo Contrast  Result Date: 11/29/2022 CLINICAL DATA:  Altered mental status. Confusion and weakness off and on for a month. Bilateral arm shaking. EXAM: CT HEAD WITHOUT CONTRAST TECHNIQUE: Contiguous axial images were obtained from the base of the skull through the vertex without intravenous contrast. RADIATION DOSE REDUCTION: This exam was performed according to the departmental dose-optimization program which includes automated exposure control, adjustment of the mA and/or kV according to patient size  and/or use of iterative reconstruction technique. COMPARISON:  CTA head/neck 06/08/2022.  MRI brain 06/09/2022. FINDINGS: Brain: No acute intracranial hemorrhage. 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 Gray-white differentiation is otherwise preserved. No hydrocephalus or extra-axial collection. No mass effect or midline shift. Vascular: No hyperdense vessel or unexpected calcification. Skull: No calvarial fracture or suspicious bone lesion. Skull base is unremarkable. Sinuses/Orbits: No acute finding. Other: None. IMPRESSION: 1. No acute intracranial abnormality. 2. 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 Electronically Signed   By: Orvan Falconer M.D.   On: 11/29/2022 15:47    Scheduled Meds:  brimonidine  1 drop Both Eyes BID   LORazepam  0.5 mg Intravenous Once   pregabalin  50 mg Oral BID   timolol  1 drop Both Eyes BID   Continuous Infusions:  lactated ringers 65 mL/hr at 11/30/22 1343    LOS: 0 days   Time spent: 50 mins  Paiton Boultinghouse Laural Benes, MD How to contact the [facility] Attending or Consulting provider 7A - 7P or covering provider during after hours 7P -7A, for this patient?  Check the care team in [facility] and look for a) attending/consulting [facility] provider listed and b) the [facility] team listed Log into [email or web address] to find provider on call.  Locate the [facility] provider you are looking for under [facility] and page to a number that you can be directly reached. If you still have difficulty reaching the provider, please page the  DOC (Director on Call) for the Hospitalists listed on amion for assistance.  11/30/2022, 3:05 PM

## 2022-11-30 NOTE — Plan of Care (Signed)
  Problem: Education: Goal: Knowledge of General Education information will improve Description: Including pain rating scale, medication(s)/side effects and non-pharmacologic comfort measures Outcome: Progressing   Problem: Clinical Measurements: Goal: Respiratory complications will improve Outcome: Progressing   Problem: Pain Managment: Goal: General experience of comfort will improve Outcome: Progressing   

## 2022-11-30 NOTE — Plan of Care (Signed)
  Problem: Acute Rehab PT Goals(only PT should resolve) Goal: Pt Will Go Supine/Side To Sit Outcome: Progressing Flowsheets (Taken 11/30/2022 1432) Pt will go Supine/Side to Sit:  with contact guard assist  with supervision Goal: Patient Will Transfer Sit To/From Stand Outcome: Progressing Flowsheets (Taken 11/30/2022 1432) Patient will transfer sit to/from stand:  with contact guard assist  with minimal assist Goal: Pt Will Transfer Bed To Chair/Chair To Bed Outcome: Progressing Flowsheets (Taken 11/30/2022 1432) Pt will Transfer Bed to Chair/Chair to Bed:  with contact guard assist  with min assist Goal: Pt Will Ambulate Outcome: Progressing Flowsheets (Taken 11/30/2022 1432) Pt will Ambulate:  50 feet  with contact guard assist  with minimal assist  with rolling walker   2:33 PM, 11/30/22 Ocie Bob, MPT Physical Therapist with [facility] [phone] office [phone] mobile phone

## 2022-12-01 ENCOUNTER — Inpatient Hospital Stay (HOSPITAL_COMMUNITY): Payer: Medicare Other

## 2022-12-01 DIAGNOSIS — G9341 Metabolic encephalopathy: Secondary | ICD-10-CM | POA: Diagnosis not present

## 2022-12-01 DIAGNOSIS — I480 Paroxysmal atrial fibrillation: Secondary | ICD-10-CM | POA: Diagnosis not present

## 2022-12-01 DIAGNOSIS — N179 Acute kidney failure, unspecified: Secondary | ICD-10-CM | POA: Diagnosis not present

## 2022-12-01 LAB — BASIC METABOLIC PANEL
Anion gap: 9 (ref 5–15)
BUN: 32 mg/dL — ABNORMAL HIGH (ref 8–23)
CO2: 33 mmol/L — ABNORMAL HIGH (ref 22–32)
Calcium: 8.2 mg/dL — ABNORMAL LOW (ref 8.9–10.3)
Chloride: 96 mmol/L — ABNORMAL LOW (ref 98–111)
Creatinine, Ser: 0.99 mg/dL (ref 0.61–1.24)
GFR, Estimated: >60 mL/min (ref >60)
Glucose, Bld: 90 mg/dL (ref 70–99)
Potassium: 4.2 mmol/L (ref 3.5–5.1)
Sodium: 138 mmol/L (ref 135–145)

## 2022-12-01 NOTE — Progress Notes (Signed)
Has been alert and oriented x 4 today but with some forgetfulness .  Ambulated with Mobility tech in hallway and had loose bm when returned to room and bedside commode.  IV fluids discontinued.  Waiting on insurance authorization to go to [facility].  Wife has been at bedside some today.

## 2022-12-01 NOTE — TOC Progression Note (Signed)
Transition of Care [facility]) - Progression Note    Patient Details  Name: Douglas [name] MRN: [id number] Date of Birth: [birth year]  Transition of Care [facility]) CM/SW Contact  Karn Cassis, [place] Phone Number: 12/01/2022, 8:00 AM  Clinical Narrative: Bed offers provided and pt's wife chooses [facility]. Facility notified. Authorization pending.       Expected Discharge Plan: Skilled Nursing Facility Barriers to Discharge: Continued Medical Work up  Expected Discharge Plan and Services In-house Referral: Clinical Social Work   Post Acute Care Choice: Skilled Nursing Facility Living arrangements for the past 2 months: Single Family Home                                       Social Determinants of Health (SDOH) Interventions SDOH Screenings   Food Insecurity: No Food Insecurity (11/29/2022)  Housing: Low Risk  (11/29/2022)  Transportation Needs: No Transportation Needs (11/29/2022)  Utilities: Not At Risk (11/29/2022)  Tobacco Use: Medium Risk (11/30/2022)    Readmission Risk Interventions     No data to display

## 2022-12-01 NOTE — Progress Notes (Signed)
PROGRESS NOTE   SUGAR LULGJURAJ  ZOX:[id number] DOB: 1950-07-24 DOA: 11/29/2022 PCP: Elise Benne, MD   Chief Complaint  Patient presents with   Weakness   Level of care: Telemetry  Brief Admission History:   72 y.o. male with medical history significant for rheumatoid arthritis, pulmonary embolism, hypertension, nocturnal hypoxemia on 2 to 3 L at night, atrial fibrillation, CVA Patient was brought to the ED multiple complaints - reports of confusion, weakness, multiple falls, change in urine or stool color, urinary incontinence and hallucinations.  Erythema evaluation, patient is awake, able to answer questions.  Reports generalized weakness and difficulty ambulating.  Spouse reported that patient was hallucinating, and getting increasingly confused with poor oral intake over the past week.  On the way here-- patient reported he was seeing alligators and lions on trees.  Also reported difficulty waking patient up yesterday.  Patient reports bilateral lower extremity swelling over the past 1 to 2 weeks.   ED Course: Temperature 97.3.  Heart rate 80s to 90s.  Respiratory rate 12-23.  Blood pressure systolic 130s to 811B.  O2 sats 91 to 94% on 2-1/2 L. Creatinine elevated at 2.  Platelets low at 65.  Lactic acid 1.2.  Ammonia level 28.  VBG shows pH of 7.4, pCO2 of 62.  UA not suggestive of infection. Head CT negative for acute abnormality.  CT abdomen and pelvis with contrast without acute intra-abdominal process, suggest renal dysfunction, and AAA. 1 L bolus normal saline given.  Hospitalist to admit for altered mental status and AKI.   Assessment and Plan:  Acute metabolic encephalopathy Reported confused speech, hallucinations, weakness over the past month with multiple falls, urinary incontinence.  On exam 3/5 strength to right lower extremity 5/5 strength to left lower extremity, no other focal deficits.  Head CT shows old right cerebellar infarct no acute abnormality.   Ammonia unremarkable at 28.  Afebrile without leukocytosis.  No signs to suggest infection at this time.  Chest xray, UA, CT A/P- not suggestive of infection.  Reports poor oral intake.  Possibly dehydration, need to rule out stroke. - secondary to AKI/dehydration, treated with IV fluid hydration  - MRI brain unable to get due to metal in eye  - PT eval completed with equipment recs- working on SNF placement   Nocturnal hypoxemia Resume home 2-1/2 L of O2  Essential hypertension Elevated systolic up to 170s. -Held home lisinopril HCTZ with AKI -BP seems controlled on amlodipine 5 mg  -As needed labetalol 10 mg for systolic greater than 170.  Remote history of Atrial fibrillation  History of transient atrial fibrillation 2022 during hospitalization for COVID-pneumonia, acute PE.  He was not placed on lifelong anticoagulation. -telemetry now showing sinus rhythm, continue to follow, may need a holter monitor at discharge  -he has been in sinus rhythm on telemetry   Thrombocytopenia  Platelets 65, chronic thrombocytopenia.  No history of liver disease.  Denies alcohol abuse -recheck CBC in AM   AKI (acute kidney injury) - RESOLVED  Cr elevated at 2, baseline 0.8-1.  Likely prerenal from dehydration, considering poor oral intake, he is also on lisinopril HCTZ. -improved with IV fluids -we didn't resume home lisinopril/HCT on 10/15 due to soft BPs   DVT prophylaxis: SCDs Code Status: Full  Family Communication:  Disposition: Status is: Inpatient   Consultants:   Procedures:   Antimicrobials:    Subjective: Pt says he is starting to feel better, much less SOB and they have been reducing supplemental  oxygen.    Objective: Vitals:   11/30/22 0922 11/30/22 1305 11/30/22 2123 12/01/22 0444  BP: (!) 143/87 (!) 149/89 138/81 (!) 117/97  Pulse: 81 81 77 74  Resp: 20     Temp: 99.2 F (37.3 C) 97.6 F (36.4 C) 97.7 F (36.5 C) 98.2 F (36.8 C)  TempSrc:  Oral Oral Oral  SpO2:  (!) 89% 93% 94% 92%  Weight:      Height:        Intake/Output Summary (Last 24 hours) at 12/01/2022 1215 Last data filed at 12/01/2022 0900 Gross per 24 hour  Intake 1462.7 ml  Output 1000 ml  Net 462.7 ml   Filed Weights   11/29/22 1310  Weight: 95.3 kg   Examination:  General exam: awake, alert, cooperative, NAD, Appears calm and comfortable  Respiratory system: no increased work of breathing. Respiratory effort normal. Cardiovascular system: normal S1 & S2 heard. No JVD, murmurs, rubs, gallops or clicks. No pedal edema. Gastrointestinal system: Abdomen is nondistended, soft and nontender. No organomegaly or masses felt. Normal bowel sounds heard. Central nervous system: Alert and oriented x3. No focal neurological deficits. Extremities: Symmetric 5 x 5 power. Skin: No rashes, lesions or ulcers. Psychiatry: Judgement and insight appear normal. Mood & affect appropriate.   Data Reviewed: I have personally reviewed following labs and imaging studies  CBC: Recent Labs  Lab 11/29/22 1328 11/30/22 0423  WBC 5.6 5.6  NEUTROABS 3.5  --   HGB 16.9 16.5  HCT 53.4* 52.7*  MCV 105.5* 105.0*  PLT 65* 69*    Basic Metabolic Panel: Recent Labs  Lab 11/29/22 1328 11/30/22 0423 12/01/22 0846  NA 141 143 138  K 4.4 4.3 4.2  CL 97* 97* 96*  CO2 33* 36* 33*  GLUCOSE 118* 100* 90  BUN 46* 41* 32*  CREATININE 2.00* 1.51* 0.99  CALCIUM 8.7* 8.5* 8.2*    CBG: No results for input(s): "GLUCAP" in the last 168 hours.  No results found for this or any previous visit (from the past 240 hour(s)).   Radiology Studies: DG CHEST PORT 1 VIEW  Result Date: 11/30/2022 CLINICAL DATA:  Suspected pneumonia, hypoxia EXAM: PORTABLE CHEST 1 VIEW COMPARISON:  Chest radiograph 1 day prior FINDINGS: The cardiomediastinal silhouette is stable Lung volumes are low, unchanged. Mildly coarsened interstitial markings in both lungs stable. There is no new or worsening focal airspace disease.  There is no definite overt pulmonary edema. There is no significant pleural effusion. There is no pneumothorax There is no acute osseous abnormality. Remote right-sided rib fractures are again noted. IMPRESSION: Stable chest with no radiographic evidence of acute cardiopulmonary process. Electronically Signed   By: Lesia Hausen M.D.   On: 11/30/2022 12:15   CT ABDOMEN PELVIS W CONTRAST  Result Date: 11/29/2022 CLINICAL DATA:  Intermittent altered mental status for the past month. EXAM: CT ABDOMEN AND PELVIS WITH CONTRAST TECHNIQUE: Multidetector CT imaging of the abdomen and pelvis was performed using the standard protocol following bolus administration of intravenous contrast. RADIATION DOSE REDUCTION: This exam was performed according to the departmental dose-optimization program which includes automated exposure control, adjustment of the mA and/or kV according to patient size and/or use of iterative reconstruction technique. CONTRAST:  80mL OMNIPAQUE IOHEXOL 300 MG/ML  SOLN COMPARISON:  None Available. FINDINGS: Lower chest: No acute abnormality. Subsegmental atelectasis in both posterior lower lobes. Hepatobiliary: No focal liver abnormality is seen. No gallstones, gallbladder wall thickening, or biliary dilatation. Pancreas: Unremarkable. No pancreatic ductal dilatation or surrounding  inflammatory changes. Spleen: Normal in size without focal abnormality. Adrenals/Urinary Tract: Adrenal glands are unremarkable. No renal calculi, solid lesion, or hydronephrosis. Delayed excretion of contrast from both kidneys. Bladder is unremarkable. Stomach/Bowel: The stomach is mildly distended. No bowel wall thickening, distention, or surrounding inflammatory changes. Diffuse colonic diverticulosis. Normal appendix. Vascular/Lymphatic: Fusiform 5.2 x 5.1 cm infrarenal abdominal aortic aneurysm. Aortoiliac atherosclerotic vascular disease. No enlarged abdominal or pelvic lymph nodes. Reproductive: Prostate is  unremarkable. Other: No free fluid or pneumoperitoneum. 2.1 cm lobulated and 1.9 cm round partially calcified omental lesions in the left abdomen are consistent with old fat necrosis or omental infarct. Musculoskeletal: No acute or significant osseous findings. Prior bilateral hip arthroplasties. 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 IMPRESSION: 1. No acute intra-abdominal process. 2. Delayed excretion of contrast from both kidneys, suggestive of renal dysfunction. 3. 5.2 cm infrarenal abdominal aortic aneurysm. Recommend follow-up CT or MR as appropriate in 6 months and referral to or continued care with vascular specialist. (Ref.: J Vasc Surg. 2018; 67:2-77 and J Am Coll Radiol 2013;10(10):789-794.) 4.  Aortic Atherosclerosis (ICD10-I70.0). Electronically Signed   By: Obie Dredge M.D.   On: 11/29/2022 17:53   DG Chest Portable 1 View  Result Date: 11/29/2022 CLINICAL DATA:  Fall, confusion. EXAM: PORTABLE CHEST 1 VIEW COMPARISON:  Chest radiograph 02/19/2020. FINDINGS: Low lung volumes accentuate the pulmonary vasculature and cardiomediastinal silhouette. No consolidation. Mild interstitial prominence be due to degree of inspiration or mild interstitial pulmonary edema. No pleural effusion or pneumothorax. Probable free air under the right hemidiaphragm. IMPRESSION: 1. Probable free air under the right hemidiaphragm. Recommend CT abdomen/pelvis. 2. Mild interstitial prominence be due to poor inspiratory effort or mild interstitial pulmonary edema. Critical Value/emergent results were called by telephone at the time of interpretation on 11/29/2022 at 3:53 pm to provider JULIE IDOL , who verbally acknowledged these results. Electronically Signed   By: Orvan Falconer M.D.   On: 11/29/2022 15:53   CT Head Wo Contrast  Result Date: 11/29/2022 CLINICAL DATA:  Altered mental status. Confusion and weakness off and on for a month. Bilateral arm shaking. EXAM: CT HEAD WITHOUT CONTRAST  TECHNIQUE: Contiguous axial images were obtained from the base of the skull through the vertex without intravenous contrast. RADIATION DOSE REDUCTION: This exam was performed according to the departmental dose-optimization program which includes automated exposure control, adjustment of the mA and/or kV according to patient size and/or use of iterative reconstruction technique. COMPARISON:  CTA head/neck 06/08/2022.  MRI brain 06/09/2022. FINDINGS: Brain: No acute intracranial hemorrhage. 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 Gray-white differentiation is otherwise preserved. No hydrocephalus or extra-axial collection. No mass effect or midline shift. Vascular: No hyperdense vessel or unexpected calcification. Skull: No calvarial fracture or suspicious bone lesion. Skull base is unremarkable. Sinuses/Orbits: No acute finding. Other: None. IMPRESSION: 1. No acute intracranial abnormality. 2. 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 Electronically Signed   By: Orvan Falconer M.D.   On: 11/29/2022 15:47    Scheduled Meds:  amLODipine  5 mg Oral Daily   aspirin EC  81 mg Oral Daily   atorvastatin  40 mg Oral QHS   brimonidine  1 drop Both Eyes BID   hydrALAZINE  25 mg Oral Q8H   LORazepam  0.5 mg Intravenous Once   pregabalin  50 mg Oral BID   timolol  1 drop Both Eyes BID   Continuous Infusions:  lactated ringers 65 mL/hr at 12/01/22 0538    LOS: 1  day   Time spent: 48 mins  Venesa Semidey Laural Benes, MD How to contact the [facility] Attending or Consulting provider 7A - 7P or covering provider during after hours 7P -7A, for this patient?  Check the care team in [facility] and look for a) attending/consulting TRH provider listed and b) the [facility] team listed Log into [email or web address] to find provider on call.  Locate the [facility] provider you are looking for under [facility] and page to a number that you can be directly reached. If you still have difficulty reaching the provider, please page the [facility]  (Director on Call) for the Hospitalists listed on amion for assistance.  12/01/2022, 12:15 PM

## 2022-12-01 NOTE — Progress Notes (Signed)
Mobility Specialist Progress Note:    12/01/22 1450  Mobility  Activity Transferred to/from BSC;Stood at bedside  Level of Assistance Moderate assist, patient does 50-74%  Assistive Device Front wheel walker  Distance Ambulated (ft) 4 ft  Range of Motion/Exercises Active;All extremities  Activity Response Tolerated well  Mobility Referral Yes  $Mobility charge 1 Mobility  Mobility Specialist Start Time (ACUTE ONLY) 1445  Mobility Specialist Stop Time (ACUTE ONLY) 1510  Mobility Specialist Time Calculation (min) (ACUTE ONLY) 25 min   Pt received in bed, wife in room. Agreeable to mobility, required ModA to stand and transfer with RW. Tolerated well, deferred further ambulation d/t stool incontinence and necessary peri care. Left pt on BSC, notified NT. All needs met.   Lawerance Bach Mobility Specialist Please contact via Special educational needs teacher or  Rehab office at [phone]

## 2022-12-02 ENCOUNTER — Non-Acute Institutional Stay (SKILLED_NURSING_FACILITY): Payer: Self-pay | Admitting: Adult Health

## 2022-12-02 ENCOUNTER — Other Ambulatory Visit: Payer: Self-pay | Admitting: Adult Health

## 2022-12-02 ENCOUNTER — Encounter: Payer: Self-pay | Admitting: Adult Health

## 2022-12-02 DIAGNOSIS — R498 Other voice and resonance disorders: Secondary | ICD-10-CM | POA: Diagnosis not present

## 2022-12-02 DIAGNOSIS — R278 Other lack of coordination: Secondary | ICD-10-CM | POA: Diagnosis not present

## 2022-12-02 DIAGNOSIS — I5032 Chronic diastolic (congestive) heart failure: Secondary | ICD-10-CM

## 2022-12-02 DIAGNOSIS — I482 Chronic atrial fibrillation, unspecified: Secondary | ICD-10-CM | POA: Diagnosis not present

## 2022-12-02 DIAGNOSIS — I700 Atherosclerosis of aorta: Secondary | ICD-10-CM | POA: Insufficient documentation

## 2022-12-02 DIAGNOSIS — D696 Thrombocytopenia, unspecified: Secondary | ICD-10-CM

## 2022-12-02 DIAGNOSIS — Z96643 Presence of artificial hip joint, bilateral: Secondary | ICD-10-CM | POA: Diagnosis not present

## 2022-12-02 DIAGNOSIS — K219 Gastro-esophageal reflux disease without esophagitis: Secondary | ICD-10-CM | POA: Insufficient documentation

## 2022-12-02 DIAGNOSIS — H40053 Ocular hypertension, bilateral: Secondary | ICD-10-CM | POA: Insufficient documentation

## 2022-12-02 DIAGNOSIS — M069 Rheumatoid arthritis, unspecified: Secondary | ICD-10-CM

## 2022-12-02 DIAGNOSIS — R4182 Altered mental status, unspecified: Secondary | ICD-10-CM | POA: Diagnosis not present

## 2022-12-02 DIAGNOSIS — E785 Hyperlipidemia, unspecified: Secondary | ICD-10-CM

## 2022-12-02 DIAGNOSIS — I110 Hypertensive heart disease with heart failure: Secondary | ICD-10-CM | POA: Insufficient documentation

## 2022-12-02 DIAGNOSIS — I5030 Unspecified diastolic (congestive) heart failure: Secondary | ICD-10-CM | POA: Insufficient documentation

## 2022-12-02 DIAGNOSIS — I2692 Saddle embolus of pulmonary artery without acute cor pulmonale: Secondary | ICD-10-CM | POA: Diagnosis not present

## 2022-12-02 DIAGNOSIS — N289 Disorder of kidney and ureter, unspecified: Secondary | ICD-10-CM | POA: Diagnosis not present

## 2022-12-02 DIAGNOSIS — I480 Paroxysmal atrial fibrillation: Secondary | ICD-10-CM | POA: Diagnosis not present

## 2022-12-02 DIAGNOSIS — G8929 Other chronic pain: Secondary | ICD-10-CM | POA: Diagnosis not present

## 2022-12-02 DIAGNOSIS — J449 Chronic obstructive pulmonary disease, unspecified: Secondary | ICD-10-CM | POA: Diagnosis not present

## 2022-12-02 DIAGNOSIS — Z9181 History of falling: Secondary | ICD-10-CM | POA: Diagnosis not present

## 2022-12-02 DIAGNOSIS — M6281 Muscle weakness (generalized): Secondary | ICD-10-CM | POA: Diagnosis not present

## 2022-12-02 DIAGNOSIS — R600 Localized edema: Secondary | ICD-10-CM | POA: Diagnosis not present

## 2022-12-02 DIAGNOSIS — R262 Difficulty in walking, not elsewhere classified: Secondary | ICD-10-CM | POA: Diagnosis not present

## 2022-12-02 DIAGNOSIS — G4734 Idiopathic sleep related nonobstructive alveolar hypoventilation: Secondary | ICD-10-CM | POA: Diagnosis not present

## 2022-12-02 DIAGNOSIS — Z8673 Personal history of transient ischemic attack (TIA), and cerebral infarction without residual deficits: Secondary | ICD-10-CM | POA: Insufficient documentation

## 2022-12-02 DIAGNOSIS — I10 Essential (primary) hypertension: Secondary | ICD-10-CM | POA: Diagnosis not present

## 2022-12-02 DIAGNOSIS — G894 Chronic pain syndrome: Secondary | ICD-10-CM

## 2022-12-02 DIAGNOSIS — N179 Acute kidney failure, unspecified: Secondary | ICD-10-CM | POA: Diagnosis not present

## 2022-12-02 DIAGNOSIS — Z9981 Dependence on supplemental oxygen: Secondary | ICD-10-CM | POA: Diagnosis not present

## 2022-12-02 DIAGNOSIS — M810 Age-related osteoporosis without current pathological fracture: Secondary | ICD-10-CM | POA: Diagnosis not present

## 2022-12-02 DIAGNOSIS — R441 Visual hallucinations: Secondary | ICD-10-CM | POA: Diagnosis not present

## 2022-12-02 DIAGNOSIS — D751 Secondary polycythemia: Secondary | ICD-10-CM | POA: Diagnosis not present

## 2022-12-02 DIAGNOSIS — Z8616 Personal history of COVID-19: Secondary | ICD-10-CM | POA: Diagnosis not present

## 2022-12-02 DIAGNOSIS — G9341 Metabolic encephalopathy: Secondary | ICD-10-CM | POA: Diagnosis not present

## 2022-12-02 LAB — CBC
HCT: 54.1 % — ABNORMAL HIGH (ref 39.0–52.0)
Hemoglobin: 16.6 g/dL (ref 13.0–17.0)
MCH: 32.3 pg (ref 26.0–34.0)
MCHC: 30.7 g/dL (ref 30.0–36.0)
MCV: 105.3 fL — ABNORMAL HIGH (ref 80.0–100.0)
Platelets: 65 10*3/uL — ABNORMAL LOW (ref 150–400)
RBC: 5.14 MIL/uL (ref 4.22–5.81)
RDW: 13.3 % (ref 11.5–15.5)
WBC: 6 10*3/uL (ref 4.0–10.5)
nRBC: 0 % (ref 0.0–0.2)

## 2022-12-02 MED ORDER — SALINE SPRAY 0.65 % NA SOLN
1.0000 | NASAL | Status: DC | PRN
  Administered 2022-12-02: 1 via NASAL
  Filled 2022-12-02: qty 44

## 2022-12-02 MED ORDER — ATORVASTATIN CALCIUM 40 MG PO TABS: 40.0000 mg | ORAL_TABLET | Freq: Every day | ORAL | 3 refills | Status: DC

## 2022-12-02 MED ORDER — PREGABALIN 150 MG PO CAPS: 150.0000 mg | ORAL_CAPSULE | Freq: Two times a day (BID) | ORAL | 0 refills | Status: AC

## 2022-12-02 MED ORDER — HYDRALAZINE HCL 50 MG PO TABS: 25.0000 mg | ORAL_TABLET | Freq: Three times a day (TID) | ORAL | 3 refills | Status: DC

## 2022-12-02 MED ORDER — ACETAMINOPHEN 325 MG PO TABS: 650.0000 mg | ORAL_TABLET | Freq: Four times a day (QID) | ORAL | Status: AC | PRN

## 2022-12-02 MED ORDER — AMLODIPINE BESYLATE 5 MG PO TABS: 5.0000 mg | ORAL_TABLET | Freq: Every day | ORAL | 3 refills | Status: DC

## 2022-12-02 MED ORDER — SULFASALAZINE 500 MG PO TABS: 1000.0000 mg | ORAL_TABLET | Freq: Two times a day (BID) | ORAL | 1 refills | Status: DC

## 2022-12-02 MED ORDER — PREGABALIN 150 MG PO CAPS: 150.0000 mg | ORAL_CAPSULE | Freq: Two times a day (BID) | ORAL | 3 refills | Status: DC

## 2022-12-02 MED ORDER — HYDROCODONE-ACETAMINOPHEN 10-325 MG PO TABS: 1.0000 | ORAL_TABLET | Freq: Four times a day (QID) | ORAL | 0 refills | Status: DC | PRN

## 2022-12-02 MED ORDER — HYDROCODONE-ACETAMINOPHEN 10-325 MG PO TABS: 1.0000 | ORAL_TABLET | Freq: Four times a day (QID) | ORAL | 0 refills | Status: AC | PRN

## 2022-12-02 NOTE — Care Management Important Message (Signed)
Important Message  Patient Details  Name: Douglas [name] MRN: [id number] Date of Birth: [birth year]   Important Message Given:  Yes - Medicare IM     Corey Harold 12/02/2022, 1:31 PM

## 2022-12-02 NOTE — Plan of Care (Signed)
  Problem: Education: Goal: Knowledge of disease or condition will improve Outcome: Adequate for Discharge Goal: Knowledge of secondary prevention will improve (MUST DOCUMENT ALL) Outcome: Adequate for Discharge Goal: Knowledge of patient specific risk factors will improve Douglas [name] N/A or DELETE if not current risk factor) Outcome: Adequate for Discharge   Problem: Ischemic Stroke/TIA Tissue Perfusion: Goal: Complications of ischemic stroke/TIA will be minimized Outcome: Adequate for Discharge   Problem: Coping: Goal: Will verbalize positive feelings about self Outcome: Adequate for Discharge Goal: Will identify appropriate support needs Outcome: Adequate for Discharge   Problem: Health Behavior/Discharge Planning: Goal: Ability to manage health-related needs will improve Outcome: Adequate for Discharge Goal: Goals will be collaboratively established with patient/family Outcome: Adequate for Discharge   Problem: Self-Care: Goal: Ability to participate in self-care as condition permits will improve Outcome: Adequate for Discharge Goal: Verbalization of feelings and concerns over difficulty with self-care will improve Outcome: Adequate for Discharge Goal: Ability to communicate needs accurately will improve Outcome: Adequate for Discharge   Problem: Nutrition: Goal: Risk of aspiration will decrease Outcome: Adequate for Discharge Goal: Dietary intake will improve Outcome: Adequate for Discharge   Problem: Education: Goal: Knowledge of General Education information will improve Description: Including pain rating scale, medication(s)/side effects and non-pharmacologic comfort measures Outcome: Adequate for Discharge   Problem: Health Behavior/Discharge Planning: Goal: Ability to manage health-related needs will improve Outcome: Adequate for Discharge   Problem: Clinical Measurements: Goal: Ability to maintain clinical measurements within normal limits will improve Outcome:  Adequate for Discharge Goal: Will remain free from infection Outcome: Adequate for Discharge Goal: Diagnostic test results will improve Outcome: Adequate for Discharge Goal: Respiratory complications will improve Outcome: Adequate for Discharge Goal: Cardiovascular complication will be avoided Outcome: Adequate for Discharge   Problem: Activity: Goal: Risk for activity intolerance will decrease Outcome: Adequate for Discharge   Problem: Nutrition: Goal: Adequate nutrition will be maintained Outcome: Adequate for Discharge   Problem: Coping: Goal: Level of anxiety will decrease Outcome: Adequate for Discharge   Problem: Elimination: Goal: Will not experience complications related to bowel motility Outcome: Adequate for Discharge Goal: Will not experience complications related to urinary retention Outcome: Adequate for Discharge   Problem: Pain Managment: Goal: General experience of comfort will improve Outcome: Adequate for Discharge   Problem: Safety: Goal: Ability to remain free from injury will improve Outcome: Adequate for Discharge   Problem: Skin Integrity: Goal: Risk for impaired skin integrity will decrease Outcome: Adequate for Discharge   Problem: Acute Rehab PT Goals(only PT should resolve) Goal: Pt Will Go Supine/Side To Sit Outcome: Adequate for Discharge Goal: Patient Will Transfer Sit To/From Stand Outcome: Adequate for Discharge Goal: Pt Will Transfer Bed To Chair/Chair To Bed Outcome: Adequate for Discharge Goal: Pt Will Ambulate Outcome: Adequate for Discharge

## 2022-12-02 NOTE — Plan of Care (Signed)
  Problem: Self-Care: Goal: Verbalization of feelings and concerns over difficulty with self-care will improve Outcome: Not Progressing   Problem: Activity: Goal: Risk for activity intolerance will decrease Outcome: Not Progressing   Problem: Safety: Goal: Ability to remain free from injury will improve Outcome: Not Progressing

## 2022-12-02 NOTE — TOC Transition Note (Signed)
Transition of Care [facility]) - CM/SW Discharge Note   Patient Details  Name: Douglas [name] MRN: [id number] Date of Birth: 1950/06/25  [facility]) CM/SW Contact:  Karn Cassis, LCSW Phone Number: 12/02/2022, 11:07 AM   Clinical Narrative:  Pt d/c today to [facility]. Pt, wife, and SNF aware and agreeable. Will send d/c summary when completed. Pt will transfer with staff. Authorization received. RN given number to call report.      Final next level of care: Skilled Nursing Facility Barriers to Discharge: Barriers Resolved   Patient Goals and CMS Choice   Choice offered to / list presented to : Spouse  Discharge Placement                Patient chooses bed at: [facility] Patient to be transferred to facility by: staff Name of family member notified: wife Patient and family notified of of transfer: 12/02/22  Discharge Plan and Services Additional resources added to the After Visit Summary for   In-house Referral: Clinical Social Work   Post Acute Care Choice: Skilled Nursing Facility                               Social Determinants of Health (SDOH) Interventions SDOH Screenings   Food Insecurity: No Food Insecurity (11/29/2022)  Housing: Low Risk  (11/29/2022)  Transportation Needs: No Transportation Needs (11/29/2022)  Utilities: Not At Risk (11/29/2022)  Tobacco Use: Medium Risk (11/30/2022)     Readmission Risk Interventions     No data to display

## 2022-12-02 NOTE — Discharge Summary (Signed)
[name], is a 72 y.o. male  DOB [birth year]  MRN [id number].  Admission date:  11/29/2022  Admitting Physician  Cleora Fleet, MD  Discharge Date:  12/02/2022   Primary MD  Elise Benne, MD  Recommendations for primary care physician for things to follow:   1)Avoid ibuprofen/Advil/Aleve/Motrin/Goody Powders/Naproxen/BC powders/Meloxicam/Diclofenac/Indomethacin and other Nonsteroidal anti-inflammatory medications as these will make you more likely to bleed and can cause stomach ulcers, can also cause Kidney problems.   2)Repeat CBC Blood Test on Monday 12/07/22  3)Resume Nocturnal Home oxygen at 2.5 L/min ---at bedtime  Admission Diagnosis  AKI (acute kidney injury) (HCC) [N17.9] Altered mental status, unspecified altered mental status type [R41.82] Acute metabolic encephalopathy [G93.41]   Discharge Diagnosis  AKI (acute kidney injury) (HCC) [N17.9] Altered mental status, unspecified altered mental status type [R41.82] Acute metabolic encephalopathy [G93.41]    Principal Problem:   Acute metabolic encephalopathy Active Problems:   AKI (acute kidney injury) (HCC)   Thrombocytopenia (HCC)   Atrial fibrillation (HCC)   Nocturnal hypoxemia   Rheumatoid arthritis (HCC)   Essential hypertension      Past Medical History:  Diagnosis Date   Basal cell carcinoma 01/24/2014   left temple tx mohs Dr Alean Rinne 03/12/2014   Chronic pain    Hypertension     Past Surgical History:  Procedure Laterality Date   BACK SURGERY     x 3   REPLACEMENT TOTAL KNEE BILATERAL     TOTAL HIP ARTHROPLASTY     bilateral     HPI  from the history and physical done on the day of admission:   Chief Complaint: AMS   HPI: [name] is a 72 y.o. male with medical history significant for rheumatoid arthritis, pulmonary embolism, hypertension, nocturnal hypoxemia on 2 to 3 L at night,  atrial fibrillation, CVA Patient was brought to the ED multiple complaints - reports of confusion, weakness, multiple falls, change in urine or stool color, urinary incontinence and hallucinations.  Erythema evaluation, patient is awake, able to answer questions.  Reports generalized weakness and difficulty ambulating.  Spouse reported that patient was hallucinating, and getting increasingly confused with poor oral intake over the past week.  On the way here-- patient reported he was seeing alligators and lions on trees.  Also reported difficulty waking patient up yesterday. Patient reports bilateral lower extremity swelling over the past 1 to 2 weeks.   ED Course: Temperature 97.3.  Heart rate 80s to 90s.  Respiratory rate 12-23.  Blood pressure systolic 130s to 284X.  O2 sats 91 to 94% on 2-1/2 L. Creatinine elevated at 2.  Platelets low at 65.  Lactic acid 1.2.  Ammonia level 28.  VBG shows pH of 7.4, pCO2 of 62.  UA not suggestive of infection. Head CT negative for acute abnormality. CT abdomen and pelvis with contrast without acute intra-abdominal process, suggest renal dysfunction, and AAA. 1 L bolus normal saline given. Hospitalist to admit for altered mental status and AKI.   Review of Systems: As  per HPI all other systems reviewed and negative.     Hospital Course:   1)Acute metabolic encephalopathy Reported confused speech, hallucinations, weakness over the past month with multiple falls, urinary incontinence.    -  Head CT shows old right cerebellar infarct no acute abnormality.  Unable to get  MRI brain  due to metal in eye   Ammonia unremarkable at 28.  - Afebrile without leukocytosis.  No signs to suggest infection at this time.   -Chest xray, UA, CT A/P- not suggestive of infection.   -Mentation improved with IV fluids/hydration-and with resolution of AKI - PT eval completed with equipment recs- working on SNF placement  -Mentation has improved significantly   Nocturnal  hypoxemia Resume home 2-1/2 L of O2   Essential hypertension -Okay to resume PTA lisinopril/HCTZ as well as amlodipine -Hydralazine added   Remote history of Atrial fibrillation  History of transient atrial fibrillation 2022 during hospitalization for COVID-pneumonia, acute PE. -  He was not placed on lifelong anticoagulation. -telemetry now showing sinus rhythm, continue to follow,  -Patient will follow-up with PCP postdischarge to discuss possible Holter monitor -he has been in sinus rhythm on telemetry    Acute on chronic thrombocytopenia  Platelets >> 65, chronic thrombocytopenia.  No history of liver disease.  Denies alcohol abuse -No bleeding concerns at this time (hgb >>16) -Avoid NSAIDs and antiplatelet agents -Consider outpatient hematology consult -Repeat CBC on 12/07/2022   AKI (acute kidney injury) - RESOLVED  Cr elevated at 2, baseline 0.8-1.  - Likely prerenal from dehydration,  -Creatinine normalized with hydration (Creatinine 2.0 >>1.51>>0.99) -Encourage adequate oral intake, avoid dehydration  HFpEF--- patient presented with dehydration and volume depletion  -Overall appears euvolemic at this time after hydration  -echo from 06/10/2022 with EF of 55 to 60% and grade 1 diastolic dysfunction -Continue PTA lisinopril/HCTZ  Discharge Condition: stable  Follow UP   Contact information for after-discharge care     Destination     [facility] Preferred SNF Follow up on 12/07/2022.   Service: Skilled Nursing Why: Repeat CBC 12/07/22 Contact information: [address] [place] [address] [phone]                     Diet and Activity recommendation:  As advised  Discharge Instructions    Discharge Instructions     Call MD for:  difficulty breathing, headache or visual disturbances   Complete by: As directed    Call MD for:  persistant dizziness or light-headedness   Complete by: As directed    Call MD for:   persistant nausea and vomiting   Complete by: As directed    Call MD for:  temperature >100.4   Complete by: As directed    Diet - low sodium heart healthy   Complete by: As directed    Discharge instructions   Complete by: As directed    1)Avoid ibuprofen/Advil/Aleve/Motrin/Goody Powders/Naproxen/BC powders/Meloxicam/Diclofenac/Indomethacin and other Nonsteroidal anti-inflammatory medications as these will make you more likely to bleed and can cause stomach ulcers, can also cause Kidney problems.   2)Repeat CBC Blood Test on Monday 12/07/22  3)Resume Nocturnal Home oxygen at 2.5 L/min ---at bedtime   Increase activity slowly   Complete by: As directed         Discharge Medications     Allergies as of 12/02/2022       Reactions   Ancef [cefazolin] Rash   See 08/2012 admission   Penicillins Hives  Firvanq [vancomycin] Rash   Drug rash   Hydrochlorothiazide Rash   Tolerates lisinopril/hctz 10-12.5mg  QD   Ms Contin [morphine] Rash   Other Rash   Tide detergent   Oxycontin [oxycodone] Rash        Medication List     TAKE these medications    acetaminophen 325 MG tablet Commonly known as: TYLENOL Take 2 tablets (650 mg total) by mouth every 6 (six) hours as needed for mild pain (pain score 1-3) (or Fever >/= 101).   amLODipine 5 MG tablet Commonly known as: NORVASC Take 1 tablet (5 mg total) by mouth daily. Start taking on: December 03, 2022   atorvastatin 40 MG tablet Commonly known as: LIPITOR Take 1 tablet (40 mg total) by mouth at bedtime.   brimonidine 0.2 % ophthalmic solution Commonly known as: ALPHAGAN Place 1 drop into both eyes 2 (two) times daily.   famotidine 20 MG tablet Commonly known as: PEPCID Take 20 mg by mouth 2 (two) times daily.   hydrALAZINE 50 MG tablet Commonly known as: APRESOLINE Take 0.5 tablets (25 mg total) by mouth 3 (three) times daily.   HYDROcodone-acetaminophen 10-325 MG tablet Commonly known as: NORCO Take 1 tablet  by mouth every 6 (six) hours as needed for severe pain (pain score 7-10). What changed: when to take this   lisinopril-hydrochlorothiazide 10-12.5 MG tablet Commonly known as: ZESTORETIC Take 1 tablet by mouth daily.   pregabalin 150 MG capsule Commonly known as: LYRICA Take 1 capsule (150 mg total) by mouth 2 (two) times daily. What changed: when to take this   sulfaSALAzine 500 MG tablet Commonly known as: AZULFIDINE Take 2 tablets (1,000 mg total) by mouth 2 (two) times daily.   timolol 0.5 % ophthalmic solution Commonly known as: TIMOPTIC Place 1 drop into both eyes 2 (two) times daily.               Durable Medical Equipment  (From admission, onward)           Start     Ordered   11/30/22 1643  For home use only DME 3 n 1  Once        11/30/22 1642   11/30/22 1643  For home use only DME Bedside commode  Once       Question:  Patient needs a bedside commode to treat with the following condition  Answer:  Gait instability   11/30/22 1642            Major procedures and Radiology Reports - PLEASE review detailed and final reports for all details, in brief -   CT HEAD WO CONTRAST ( )  Result Date: 12/01/2022 CLINICAL DATA:  Mental status change of unknown cause EXAM: CT HEAD WITHOUT CONTRAST TECHNIQUE: Contiguous axial images were obtained from the base of the skull through the vertex without intravenous contrast. RADIATION DOSE REDUCTION: This exam was performed according to the departmental dose-optimization program which includes automated exposure control, adjustment of the mA and/or kV according to patient size and/or use of iterative reconstruction technique. COMPARISON:  11/29/2022 FINDINGS: Brain: Age related volume loss without subjective lobar predominance. No focal abnormality affects the brainstem. 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 Cerebral hemispheres show chronic small-vessel change of the white matter but no cortical or large  vessel territory stroke. No mass, hemorrhage, hydrocephalus or extra-axial collection. Vascular: There is atherosclerotic calcification of the major vessels at the base of the brain. Skull: Negative Sinuses/Orbits: Clear sinuses. Metal within the  anterior chamber of the left globe. Other: None IMPRESSION: No acute finding by CT. Age related volume loss. Chronic small-vessel ischemic changes of the cerebral hemispheric white matter. 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 Metallic foreign object in the anterior chamber of the left globe. Electronically Signed   By: Paulina Fusi M.D.   On: 12/01/2022 18:21   DG CHEST PORT 1 VIEW  Result Date: 11/30/2022 CLINICAL DATA:  Suspected pneumonia, hypoxia EXAM: PORTABLE CHEST 1 VIEW COMPARISON:  Chest radiograph 1 day prior FINDINGS: The cardiomediastinal silhouette is stable Lung volumes are low, unchanged. Mildly coarsened interstitial markings in both lungs stable. There is no new or worsening focal airspace disease. There is no definite overt pulmonary edema. There is no significant pleural effusion. There is no pneumothorax There is no acute osseous abnormality. Remote right-sided rib fractures are again noted. IMPRESSION: Stable chest with no radiographic evidence of acute cardiopulmonary process. Electronically Signed   By: Lesia Hausen M.D.   On: 11/30/2022 12:15   CT ABDOMEN PELVIS W CONTRAST  Result Date: 11/29/2022 CLINICAL DATA:  Intermittent altered mental status for the past month. EXAM: CT ABDOMEN AND PELVIS WITH CONTRAST TECHNIQUE: Multidetector CT imaging of the abdomen and pelvis was performed using the standard protocol following bolus administration of intravenous contrast. RADIATION DOSE REDUCTION: This exam was performed according to the departmental dose-optimization program which includes automated exposure control, adjustment of the mA and/or kV according to patient size and/or use of iterative reconstruction technique.  CONTRAST:  80mL OMNIPAQUE IOHEXOL 300 MG/ML  SOLN COMPARISON:  None Available. FINDINGS: Lower chest: No acute abnormality. Subsegmental atelectasis in both posterior lower lobes. Hepatobiliary: No focal liver abnormality is seen. No gallstones, gallbladder wall thickening, or biliary dilatation. Pancreas: Unremarkable. No pancreatic ductal dilatation or surrounding inflammatory changes. Spleen: Normal in size without focal abnormality. Adrenals/Urinary Tract: Adrenal glands are unremarkable. No renal calculi, solid lesion, or hydronephrosis. Delayed excretion of contrast from both kidneys. Bladder is unremarkable. Stomach/Bowel: The stomach is mildly distended. No bowel wall thickening, distention, or surrounding inflammatory changes. Diffuse colonic diverticulosis. Normal appendix. Vascular/Lymphatic: Fusiform 5.2 x 5.1 cm infrarenal abdominal aortic aneurysm. Aortoiliac atherosclerotic vascular disease. No enlarged abdominal or pelvic lymph nodes. Reproductive: Prostate is unremarkable. Other: No free fluid or pneumoperitoneum. 2.1 cm lobulated and 1.9 cm round partially calcified omental lesions in the left abdomen are consistent with old fat necrosis or omental infarct. Musculoskeletal: No acute or significant osseous findings. Prior bilateral hip arthroplasties. 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 IMPRESSION: 1. No acute intra-abdominal process. 2. Delayed excretion of contrast from both kidneys, suggestive of renal dysfunction. 3. 5.2 cm infrarenal abdominal aortic aneurysm. Recommend follow-up CT or MR as appropriate in 6 months and referral to or continued care with vascular specialist. (Ref.: J Vasc Surg. 2018; 67:2-77 and J Am Coll Radiol 2013;10(10):789-794.) 4.  Aortic Atherosclerosis (ICD10-I70.0). Electronically Signed   By: Obie Dredge M.D.   On: 11/29/2022 17:53   DG Chest Portable 1 View  Result Date: 11/29/2022 CLINICAL DATA:  Fall, confusion. EXAM: PORTABLE CHEST 1  VIEW COMPARISON:  Chest radiograph 02/19/2020. FINDINGS: Low lung volumes accentuate the pulmonary vasculature and cardiomediastinal silhouette. No consolidation. Mild interstitial prominence be due to degree of inspiration or mild interstitial pulmonary edema. No pleural effusion or pneumothorax. Probable free air under the right hemidiaphragm. IMPRESSION: 1. Probable free air under the right hemidiaphragm. Recommend CT abdomen/pelvis. 2. Mild interstitial prominence be due to poor inspiratory effort or mild interstitial pulmonary  edema. Critical Value/emergent results were called by telephone at the time of interpretation on 11/29/2022 at 3:53 pm to provider JULIE IDOL , who verbally acknowledged these results. Electronically Signed   By: Orvan Falconer M.D.   On: 11/29/2022 15:53   CT Head Wo Contrast  Result Date: 11/29/2022 CLINICAL DATA:  Altered mental status. Confusion and weakness off and on for a month. Bilateral arm shaking. EXAM: CT HEAD WITHOUT CONTRAST TECHNIQUE: Contiguous axial images were obtained from the base of the skull through the vertex without intravenous contrast. RADIATION DOSE REDUCTION: This exam was performed according to the departmental dose-optimization program which includes automated exposure control, adjustment of the mA and/or kV according to patient size and/or use of iterative reconstruction technique. COMPARISON:  CTA head/neck 06/08/2022.  MRI brain 06/09/2022. FINDINGS: Brain: No acute intracranial hemorrhage. 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 Gray-white differentiation is otherwise preserved. No hydrocephalus or extra-axial collection. No mass effect or midline shift. Vascular: No hyperdense vessel or unexpected calcification. Skull: No calvarial fracture or suspicious bone lesion. Skull base is unremarkable. Sinuses/Orbits: No acute finding. Other: None. IMPRESSION: 1. No acute intracranial abnormality. 2. 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  Electronically Signed   By: Orvan Falconer M.D.   On: 11/29/2022 15:47    Today   Subjective    [name] today has no new complaints  No fever  Or chills   No Nausea, Vomiting or Diarrhea -Eating and drinking okay -Cooperative         Patient has been seen and examined prior to discharge   Objective   Blood pressure (!) 164/98, pulse 84, temperature 98.4 F (36.9 C), temperature source Oral, resp. rate 20, height 6\' 2"  (1.88 m), weight 95.3 kg, SpO2 92%.   Intake/Output Summary (Last 24 hours) at 12/02/2022 1109 Last data filed at 12/02/2022 0900 Gross per 24 hour  Intake 300 ml  Output 1455 ml  Net -1155 ml    Exam Gen:- Awake Alert, no acute distress  HEENT:- [place].AT, No sclera icterus Neck-Supple Neck,No JVD,.  Lungs-  CTAB , good air movement bilaterally CV- S1, S2 normal, regular Abd-  +ve B.Sounds, Abd Soft, No tenderness,    Extremity/Skin:-  good pulses, bilateral lower extremities/legs with excoriated areas without secondary/superimposed cellulitis type findings Psych-affect is appropriate, oriented x3, improved mentation, more coherent Neuro-generalized weakness, no new focal deficits, no tremors    Data Review   CBC w Diff:  Lab Results  Component Value Date   WBC 6.0 12/02/2022   HGB 16.6 12/02/2022   HCT 54.1 (H) 12/02/2022   PLT 65 (L) 12/02/2022   LYMPHOPCT 19 11/29/2022   MONOPCT 16 11/29/2022   EOSPCT 2 11/29/2022   BASOPCT 1 11/29/2022    CMP:  Lab Results  Component Value Date   NA 138 12/01/2022   K 4.2 12/01/2022   CL 96 (L) 12/01/2022   CO2 33 (H) 12/01/2022   BUN 32 (H) 12/01/2022   CREATININE 0.99 12/01/2022   PROT 6.2 (L) 11/29/2022   ALBUMIN 3.2 (L) 11/29/2022   BILITOT 1.0 11/29/2022   ALKPHOS 53 11/29/2022   AST 16 11/29/2022   ALT 17 11/29/2022  .  Total Discharge time is about 33 minutes  Shon Hale M.D on 12/02/2022 at 11:09 AM  Go to [email or web address] -  for contact info  [facility] -  Office  [phone]

## 2022-12-02 NOTE — Discharge Instructions (Signed)
1)Avoid ibuprofen/Advil/Aleve/Motrin/Goody Powders/Naproxen/BC powders/Meloxicam/Diclofenac/Indomethacin and other Nonsteroidal anti-inflammatory medications as these will make you more likely to bleed and can cause stomach ulcers, can also cause Kidney problems.   2)Repeat CBC Blood Test on Monday 12/07/22  3)Resume Nocturnal Home oxygen at 2.5 L/min ---at bedtime

## 2022-12-02 NOTE — Progress Notes (Signed)
Location:  [facility] Room Number: [id number] Place of Service:  SNF (31)   CODE STATUS: dnr   Allergies  Allergen Reactions   Ancef [Cefazolin] Rash    See 08/2012 admission    Penicillins Hives   Firvanq [Vancomycin] Rash    Drug rash    Hydrochlorothiazide Rash    Tolerates lisinopril/hctz 10-12.5mg  QD   Ms Contin [Morphine] Rash   Other Rash    Tide detergent   Oxycontin [Oxycodone] Rash    Chief Complaint  Patient presents with   Hospitalization Follow-up    HPI:  He is a 72 year old man who has been hospitalized from 11-29-22 through 12-02-22. His past medical history includes: rheumatoid arthritis; pulmonary embolism; hypertension; nocturnal hypoxemia is 02 dependent; atrial fibrillation; CVA. He presented to the ED with multiple concerns: confusion; weakness; multiple falls; change in urine color, incontinence and hallucinations; and difficulty with ambulation. He has had bilateral lower extremity for the past 1-2 weeks.  Acute metabolic encephalopathy: had confusion hallucinations; weakness with multiple falls. The Ct of head demonstrated an old right cerebellar infarct no acute abnormality. Could not perform MRI due to metal in eye. His mentation improved with IVF; and resolution of acute kidney injury.  History of atrial fibrillation: transient 2022 during hospitalization for covid-pneumonia, acute pe. He has not been placed on lifelong anticoagulation therapy. During his hospitalization he was in sinus rhythm.  Acute on chronic thrombocytopenia: plt 65; no history of liver disease; no reported alcohol abuse. No concerns for bleeding.  His acute kidney injury has resolved.  He is here for short term rehab with his goal to return back home. He has chronic back pain for which he is being followed by the pain clinic.  He will continue to be followed for his chronic illnesses including:    Rheumatoid arthritis involving unspecified site; unspecified  whether rheumatoid factor present:    Chronic pain syndrome:    Aortic atherosclerosis   Chronic heart failure with preserved ejection fraction:   Past Medical History:  Diagnosis Date   Basal cell carcinoma 01/24/2014   left temple tx mohs Dr Alean Rinne 03/12/2014   Chronic pain    Hypertension     Past Surgical History:  Procedure Laterality Date   BACK SURGERY     x 3   REPLACEMENT TOTAL KNEE BILATERAL     TOTAL HIP ARTHROPLASTY     bilateral    Social History   Socioeconomic History   Marital status: Married    Spouse name: Not on file   Number of children: Not on file   Years of education: Not on file   Highest education level: Not on file  Occupational History   Not on file  Tobacco Use   Smoking status: Former   Smokeless tobacco: Never  Substance and Sexual Activity   Alcohol use: No   Drug use: No   Sexual activity: Not on file  Other Topics Concern   Not on file  Social History Narrative   Not on file   Social Determinants of Health   Financial Resource Strain: Not on file  Food Insecurity: No Food Insecurity (11/29/2022)   Hunger Vital Sign    Worried About Running Out of Food in the Last Year: Never true    Ran Out of Food in the Last Year: Never true  Transportation Needs: No Transportation Needs (11/29/2022)   PRAPARE - Administrator, Civil Service (Medical): No  Lack of Transportation (Non-Medical): No  Physical Activity: Not on file  Stress: Not on file  Social Connections: Not on file  Intimate Partner Violence: Not At Risk (11/29/2022)   Humiliation, Afraid, Rape, and Kick questionnaire    Fear of Current or Ex-Partner: No    Emotionally Abused: No    Physically Abused: No    Sexually Abused: No   No family history on file.    VITAL SIGNS BP 138/84   Pulse 76   Temp (!) 97.2 F (36.2 C)   Resp 20   Ht 6\' 2"  (1.88 m)   Wt 219 lb 6.4 oz (99.5 kg)   SpO2 91%   BMI 28.17 kg/m   Outpatient Encounter Medications as  of 12/02/2022  Medication Sig   acetaminophen (TYLENOL) 325 MG tablet Take 2 tablets (650 mg total) by mouth every 6 (six) hours as needed for mild pain (pain score 1-3) (or Fever >/= 101).   [START ON 12/03/2022] amLODipine (NORVASC) 5 MG tablet Take 1 tablet (5 mg total) by mouth daily.   atorvastatin (LIPITOR) 40 MG tablet Take 1 tablet (40 mg total) by mouth at bedtime.   brimonidine (ALPHAGAN) 0.2 % ophthalmic solution Place 1 drop into both eyes 2 (two) times daily.   famotidine (PEPCID) 20 MG tablet Take 20 mg by mouth 2 (two) times daily.   hydrALAZINE (APRESOLINE) 50 MG tablet Take 0.5 tablets (25 mg total) by mouth 3 (three) times daily.   HYDROcodone-acetaminophen (NORCO) 10-325 MG tablet Take 1 tablet by mouth every 6 (six) hours as needed for severe pain (pain score 7-10).   lisinopril-hydrochlorothiazide (ZESTORETIC) 10-12.5 MG tablet Take 1 tablet by mouth daily.   pregabalin (LYRICA) 150 MG capsule Take 1 capsule (150 mg total) by mouth 2 (two) times daily.   sulfaSALAzine (AZULFIDINE) 500 MG tablet Take 2 tablets (1,000 mg total) by mouth 2 (two) times daily.   timolol (TIMOPTIC) 0.5 % ophthalmic solution Place 1 drop into both eyes 2 (two) times daily.   Facility-Administered Encounter Medications as of 12/02/2022  Medication   acetaminophen (TYLENOL) tablet 650 mg   Or   acetaminophen (TYLENOL) suppository 650 mg   amLODipine (NORVASC) tablet 5 mg   aspirin EC tablet 81 mg   atorvastatin (LIPITOR) tablet 40 mg   brimonidine (ALPHAGAN) 0.2 % ophthalmic solution 1 drop   hydrALAZINE (APRESOLINE) tablet 25 mg   HYDROcodone-acetaminophen (NORCO) 10-325 MG per tablet 1 tablet   labetalol (NORMODYNE) injection 10 mg   LORazepam (ATIVAN) injection 0.5 mg   polyethylene glycol (MIRALAX / GLYCOLAX) packet 17 g   pregabalin (LYRICA) capsule 50 mg   sodium chloride (OCEAN) 0.65 % nasal spray 1 spray   timolol (TIMOPTIC) 0.5 % ophthalmic solution 1 drop     SIGNIFICANT  DIAGNOSTIC EXAMS  TODAY  06-08-22: hgb A1c 5.7; chol 111; ldl 55; trig 107; hdl 35 11-29-22: wbc 5.6; hgb 16.9; hct 53.4; mcv 105.5 plt 65; glucose 118; bun 46; creat 2.00; k+ 4.4; na++ 141; ca 8.7; gfr 55; protein 6.2 albumin 3.2 12-02-22: wbc 5.8; hgb 16.6; hct54.1; mcv 105.3 plt 65  Review of Systems  Constitutional:  Negative for malaise/fatigue.  HENT:  Positive for congestion.   Respiratory:  Negative for cough and shortness of breath.   Cardiovascular:  Negative for chest pain, palpitations and leg swelling.  Gastrointestinal:  Negative for abdominal pain, constipation and heartburn.  Musculoskeletal:  Positive for back pain. Negative for joint pain and myalgias.  Back pain is chronic   Skin: Negative.   Neurological:  Negative for dizziness.  Psychiatric/Behavioral:  The patient is not nervous/anxious.    Physical Exam Constitutional:      General: He is not in acute distress.    Appearance: He is well-developed. He is not diaphoretic.  Neck:     Thyroid: No thyromegaly.  Cardiovascular:     Rate and Rhythm: Normal rate and regular rhythm.     Pulses: Normal pulses.     Heart sounds: Normal heart sounds.  Pulmonary:     Effort: Pulmonary effort is normal. No respiratory distress.     Breath sounds: Normal breath sounds.     Comments: 02 Abdominal:     General: Bowel sounds are normal. There is no distension.     Palpations: Abdomen is soft.     Tenderness: There is no abdominal tenderness.  Musculoskeletal:        General: Normal range of motion.     Cervical back: Neck supple.     Right lower leg: No edema.     Left lower leg: No edema.  Lymphadenopathy:     Cervical: No cervical adenopathy.  Skin:    General: Skin is warm and dry.  Neurological:     Mental Status: He is alert. Mental status is at baseline.  Psychiatric:        Mood and Affect: Mood normal.       ASSESSMENT/ PLAN:  TODAY  AKI (acute kidney injury)/acute metabolic  encephalopathy: has resolved with IVF; his mentation has improved. He is here for short term rehab to improve upon his level of independence with his adls.   2.  Rheumatoid arthritis involving unspecified site; unspecified whether rheumatoid factor present: will continue sulfasalazine 1 gm twice daily   3. Chronic pain syndrome: is followed by pain clinic: will continue vicodin 10/325 mg every 6 hours as needed and lyrica 150 mg twice daily.   4. Aortic atherosclerosis (ct 11-29-22): is on statin  5. Chronic heart failure with preserved ejection fraction: is presently compensated;  6. Benign hypertension with coincident congestive heart failure: b/p 138/84 will continue norvasc 5 mg daily; hydralazine 25 mg three times daily; lisinopril hydrochlorothiazide: 10/12.5 mg daily   7. Nocturnal hypoxemia: is on 03 chronically  8. Thrombocytopenia: plt 65   9. Increased intraocular pressure; bilateral: will continue brimonidine to both eyes twice daily; timolol to both eyes twice daily   10. Hyperlipidemia unspecified hyperlipidemia type: ldl 55 will continue lipitor 40 mg daily   11. GERD without esophagitis: will continue pepcid 20 mg twice daily   12. History of CVA (cerebral vascular accident)   Will check cbc; bmp; vitamin B12; folate; vdrl    Synthia Innocent NP [facility]   call [phone]

## 2022-12-03 ENCOUNTER — Encounter: Payer: Self-pay | Admitting: Internal Medicine

## 2022-12-03 ENCOUNTER — Non-Acute Institutional Stay (SKILLED_NURSING_FACILITY): Payer: Medicare Other | Admitting: Internal Medicine

## 2022-12-03 DIAGNOSIS — D696 Thrombocytopenia, unspecified: Secondary | ICD-10-CM | POA: Diagnosis not present

## 2022-12-03 DIAGNOSIS — N179 Acute kidney failure, unspecified: Secondary | ICD-10-CM | POA: Diagnosis not present

## 2022-12-03 DIAGNOSIS — J984 Other disorders of lung: Secondary | ICD-10-CM

## 2022-12-03 DIAGNOSIS — G9341 Metabolic encephalopathy: Secondary | ICD-10-CM

## 2022-12-03 DIAGNOSIS — D7589 Other specified diseases of blood and blood-forming organs: Secondary | ICD-10-CM | POA: Insufficient documentation

## 2022-12-03 DIAGNOSIS — D751 Secondary polycythemia: Secondary | ICD-10-CM | POA: Insufficient documentation

## 2022-12-03 NOTE — Assessment & Plan Note (Signed)
Macrocytosis is present with an MCV of 105.5.  On 02/23/2020 B12 level was 345 and folic acid level 12.6. No anemia present; B12 and folate should be rechecked because of a history of anorexia and poor nutritional intake for months PTA.

## 2022-12-03 NOTE — Patient Instructions (Signed)
See assessment and plan under each diagnosis in the problem list and acutely for this visit 

## 2022-12-03 NOTE — Assessment & Plan Note (Signed)
Lisinopril-hydrochlorothiazide reinitiated at discharge.  It is critical the patient stay well-hydrated and this was discussed with him and his wife.

## 2022-12-03 NOTE — Progress Notes (Signed)
NURSING HOME LOCATION:  [facility] ROOM NUMBER: [id number] P   CODE STATUS:  Full Code  PCP:  Pradeep K. Pradhan MD  This is a comprehensive admission note to this SNFperformed on this date less than 30 days from date of admission. Included are preadmission medical/surgical history; reconciled medication list; family history; social history and comprehensive review of systems.  Corrections and additions to the records were documented. Comprehensive physical exam was also performed. Additionally a clinical summary was entered for each active diagnosis pertinent to this admission in the Problem List to enhance continuity of care.  HPI: He was hospitalized 10/13 - 12/02/2022 admitted with acute mental status changes in the context of clinical dehydration/volume depletion and associated AKI.  According to his wife he had been exhibiting anorexia for months PTA,eating only 1 meal a day.  On 10/12 she states she could not wake him. Finally he was able to be aroused but was exhibiting hallucinations 10/12 - 10/13, prompting the ED visit.  Head CT revealed an old right cerebellar infarct with no acute changes.  The discharge summary states they were unable to get an MRI of the brain due to to metal in his eye.  He and his wife validate that he is legally blind in the left eye but there is no extrinsic metal.  He does have glaucoma. Ammonia was 28.  UA, chest x-ray, and CT of the abdomen/ pelvis did not suggest any infection and he remained afebrile without leukocytosis. Additionally he has oxygen dependent COPD and O2 sats had typically been in the upper 80% range. At admission 10/13 BUN was 46, creatinine 2.00, & eEGFR 35.  With rehydration the BUN was 32; baseline is in the range of 14-18 upon review of records.  Predischarge creatinine was 0.99 which is baseline and GFR was greater than 60 confirming  baseline stage II CKD.  Polycythemia was present with peak H/H of 16.9/54.1. As noted the  polycythemia was in the context of chronic hypoxia. MCV was 105.5.  Past medical record was reviewed; 02/23/2020 B12 level was 345 and folate level 12.6. Chronic thrombocytopenia was present; final platelet count was 65,000.  Serially it has decreased from a high 106,000 on 06/08/2022.  PT/OT consulted and recommended SNF placement for rehab due to multiple compromising comorbidities with associated weakness.  Past medical and surgical history: Includes history of heart failure with preserved ejection fraction, history of PTE, aortic atherosclerosis, history of atrial fibrillation, history of DVT, essential hypertension, history of stroke, GERD, osteoporosis, rheumatoid arthritis, psoriatic arthritis, dyslipidemia, history of vitamin D deficiency, and chronic pain syndrome. Surgeries and procedures include back surgery, bilateral total knee replacement, and bilateral total hip arthroplasty.  Family history: reviewed.  Social history:non drinker; former smoker.   Review of systems: He did realize that he had been dehydrated and that "they were checking my kidneys" as indication for hospitalization.  He validates having both psoriatic as well as rheumatoid arthritis. He has some chronic sinus congestion which he believes is affecting his O2 sats.  He has intermittent cough and sputum production.  His wife states that he had marked edema of the lower extremity several weeks ago with weeping.  That has resolved.  He describes increased "gas" without abdominal pain.  He has chronic numbness in his hands and feet.  Constitutional: No fever, chills ,sweats  Eyes: No redness, discharge, pain, vision change ENT/mouth: No  purulent discharge, earache, change in hearing, sore throat  Cardiovascular: No chest pain,  palpitations, paroxysmal nocturnal dyspnea, claudication Respiratory: No  hemoptysis, significant snoring, apnea  Gastrointestinal: No heartburn, dysphagia, abdominal pain, nausea /vomiting, rectal  bleeding, melena, change in bowels Genitourinary: No dysuria, hematuria, pyuria, incontinence, nocturia Musculoskeletal: No joint stiffness, joint swelling, weakness, pain Dermatologic: No rash, pruritus, change in appearance of skin Neurologic: No dizziness, headache, syncope, seizures Psychiatric: No significant anxiety, depression, insomnia, anorexia Endocrine: No change in hair/skin/nails, excessive thirst, excessive hunger, excessive urination  Hematologic/lymphatic: No significant bruising, lymphadenopathy, abnormal bleeding Allergy/immunology: No itchy/watery eyes, significant sneezing, urticaria, angioedema  Physical exam:  Pertinent or positive findings: There is some erythema of the lids.  He does have slight bilateral ptosis.  Hair is disheveled as is his beard and mustache.  There is some dental malalignment.  He is wearing nasal oxygen. Rhythm is slightly irregular. He has scattered low-grade rales and scattered expiratory wheezing.  Pedal pulses are not palpable.  He has trace edema at the sock line.  Fusiform changes of the knees are present with well-healed operative scars.  He has mixed PIP and DIP arthritic changes of the hands.  There were marked keratotic changes over the forearms with some scattered erythema without clinical cellulitis.  He has isolated eschar and keratotic changes over the shins.  General appearance: no acute distress, increased work of breathing is present.   Lymphatic: No lymphadenopathy about the head, neck, axilla. Eyes: No lid edema is present. There is no scleral icterus. Ears:  External ear exam shows no significant lesions or deformities.   Nose:  External nasal examination shows no deformity or inflammation. Nasal mucosa are pink and moist without lesions, exudates Neck:  No thyromegaly, masses, tenderness noted.    Heart:  No gallop, murmur, click, rub.  Lungs:  without rales, rubs. Abdomen: Bowel sounds are normal.  Abdomen is soft and nontender  with no organomegaly, hernias, masses. GU: Deferred  Extremities:  No cyanosis, clubbing. Neurologic exam: Balance, Rhomberg, finger to nose testing could not be completed due to clinical state Skin: Warm & dry w/o tenting. No significant rash.  See clinical summary under each active problem in the Problem List with associated updated therapeutic plan

## 2022-12-03 NOTE — Assessment & Plan Note (Addendum)
Presently he is oriented x 3.  Pathophysiology of the encephalopathy discussed with him and his wife.Update  B12 , TSH & VDRL.

## 2022-12-03 NOTE — Assessment & Plan Note (Addendum)
Current platelet count is 65,000 down from a peak value of 104,000 on 06/08/2022.  No bleeding dyscrasias noted; continue to monitor.  If progressive Hematology consult indicated.

## 2022-12-03 NOTE — Assessment & Plan Note (Addendum)
Peak hemoglobin was 16.9 and peak hematocrit 54.1.  This is in the context of oxygen dependent COPD.  PTA O2 sats reported in the upper 80% range despite supplemental oxygen. Critical need to monitor O2 sats at home  with minimal goal > 88% discussed with patient and wife.

## 2022-12-07 ENCOUNTER — Other Ambulatory Visit (HOSPITAL_COMMUNITY)
Admission: RE | Admit: 2022-12-07 | Discharge: 2022-12-07 | Disposition: A | Discharge status: Home / Self Care | Payer: Medicare Other | Source: Skilled Nursing Facility | Attending: Adult Health | Admitting: Adult Health

## 2022-12-07 DIAGNOSIS — R4182 Altered mental status, unspecified: Secondary | ICD-10-CM | POA: Insufficient documentation

## 2022-12-08 ENCOUNTER — Other Ambulatory Visit (HOSPITAL_COMMUNITY)
Admission: RE | Admit: 2022-12-08 | Discharge: 2022-12-08 | Disposition: A | Discharge status: Home / Self Care | Payer: Medicare Other | Source: Skilled Nursing Facility | Attending: Adult Health | Admitting: Adult Health

## 2022-12-08 ENCOUNTER — Non-Acute Institutional Stay (SKILLED_NURSING_FACILITY): Payer: Self-pay | Admitting: Adult Health

## 2022-12-08 ENCOUNTER — Other Ambulatory Visit: Payer: Self-pay | Admitting: Adult Health

## 2022-12-08 ENCOUNTER — Encounter: Payer: Self-pay | Admitting: Adult Health

## 2022-12-08 DIAGNOSIS — M6281 Muscle weakness (generalized): Secondary | ICD-10-CM | POA: Insufficient documentation

## 2022-12-08 DIAGNOSIS — I480 Paroxysmal atrial fibrillation: Secondary | ICD-10-CM | POA: Diagnosis not present

## 2022-12-08 DIAGNOSIS — N179 Acute kidney failure, unspecified: Secondary | ICD-10-CM | POA: Diagnosis not present

## 2022-12-08 DIAGNOSIS — G9341 Metabolic encephalopathy: Secondary | ICD-10-CM

## 2022-12-08 DIAGNOSIS — I5032 Chronic diastolic (congestive) heart failure: Secondary | ICD-10-CM | POA: Diagnosis not present

## 2022-12-08 MED ORDER — LISINOPRIL-HYDROCHLOROTHIAZIDE 10-12.5 MG PO TABS: 1.0000 | ORAL_TABLET | Freq: Every day | ORAL | 0 refills | Status: AC

## 2022-12-08 MED ORDER — TIMOLOL MALEATE 0.5 % OP SOLN: 1.0000 [drp] | Freq: Two times a day (BID) | OPHTHALMIC | 0 refills | Status: AC

## 2022-12-08 MED ORDER — AMLODIPINE BESYLATE 5 MG PO TABS: 5.0000 mg | ORAL_TABLET | Freq: Every day | ORAL | 0 refills | Status: AC

## 2022-12-08 MED ORDER — BRIMONIDINE TARTRATE 0.2 % OP SOLN: 1.0000 [drp] | Freq: Two times a day (BID) | OPHTHALMIC | 0 refills | Status: AC

## 2022-12-08 MED ORDER — ATORVASTATIN CALCIUM 40 MG PO TABS: 40.0000 mg | ORAL_TABLET | Freq: Every day | ORAL | 0 refills | Status: AC

## 2022-12-08 MED ORDER — SULFASALAZINE 500 MG PO TABS: 1000.0000 mg | ORAL_TABLET | Freq: Two times a day (BID) | ORAL | 0 refills | Status: AC

## 2022-12-08 MED ORDER — HYDRALAZINE HCL 50 MG PO TABS: 25.0000 mg | ORAL_TABLET | Freq: Three times a day (TID) | ORAL | 0 refills | Status: AC

## 2022-12-08 NOTE — Progress Notes (Signed)
Location:  [facility] Room Number: 159 Place of Service:  SNF (31)   CODE STATUS: full code   Allergies  Allergen Reactions   Ancef [Cefazolin] Rash    See 08/2012 admission    Penicillins Hives   Firvanq [Vancomycin] Rash    Drug rash    Hydrochlorothiazide Rash    Tolerates lisinopril/hctz 10-12.5mg  QD   Ms Contin [Morphine] Rash   Other Rash    Tide detergent   Oxycontin [Oxycodone] Rash    Chief Complaint  Patient presents with   Discharge Note    HPI:  He is being discharged to home with home health for pt/ot. He will need his prescriptions written and will need to follow up with his medical provider. He will not need any dme. He had been hospitalized for acute metabolic encephalopathy and acuate kidney injury. He was admitted to this facility for short term rehab. He is reading to return back home.   Past Medical History:  Diagnosis Date   Basal cell carcinoma 01/24/2014   left temple tx mohs Dr Alean Rinne 03/12/2014   Chronic pain    Hypertension     Past Surgical History:  Procedure Laterality Date   BACK SURGERY     x 3   REPLACEMENT TOTAL KNEE BILATERAL     TOTAL HIP ARTHROPLASTY     bilateral    Social History   Socioeconomic History   Marital status: Married    Spouse name: Not on file   Number of children: Not on file   Years of education: Not on file   Highest education level: Not on file  Occupational History   Not on file  Tobacco Use   Smoking status: Former   Smokeless tobacco: Never  Substance and Sexual Activity   Alcohol use: No   Drug use: No   Sexual activity: Not on file  Other Topics Concern   Not on file  Social History Narrative   Not on file   Social Determinants of Health   Financial Resource Strain: Not on file  Food Insecurity: No Food Insecurity (11/29/2022)   Hunger Vital Sign    Worried About Running Out of Food in the Last Year: Never true    Ran Out of Food in the Last Year: Never  true  Transportation Needs: No Transportation Needs (11/29/2022)   PRAPARE - Administrator, Civil Service (Medical): No    Lack of Transportation (Non-Medical): No  Physical Activity: Not on file  Stress: Not on file  Social Connections: Not on file  Intimate Partner Violence: Not At Risk (11/29/2022)   Humiliation, Afraid, Rape, and Kick questionnaire    Fear of Current or Ex-Partner: No    Emotionally Abused: No    Physically Abused: No    Sexually Abused: No   No family history on file.    VITAL SIGNS BP 138/71   Pulse 76   Temp 97.9 F (36.6 C)   Resp 20   Ht 6\' 2"  (1.88 m)   Wt 220 lb 3.2 oz (99.9 kg)   SpO2 95%   BMI 28.27 kg/m   Outpatient Encounter Medications as of 12/08/2022  Medication Sig   acetaminophen (TYLENOL) 325 MG tablet Take 2 tablets (650 mg total) by mouth every 6 (six) hours as needed for mild pain (pain score 1-3) (or Fever >/= 101).   amLODipine (NORVASC) 5 MG tablet Take 1 tablet (5 mg total) by mouth daily.  atorvastatin (LIPITOR) 40 MG tablet Take 1 tablet (40 mg total) by mouth at bedtime.   brimonidine (ALPHAGAN) 0.2 % ophthalmic solution Place 1 drop into both eyes 2 (two) times daily.   famotidine (PEPCID) 20 MG tablet Take 20 mg by mouth 2 (two) times daily.   hydrALAZINE (APRESOLINE) 50 MG tablet Take 0.5 tablets (25 mg total) by mouth 3 (three) times daily.   HYDROcodone-acetaminophen (NORCO) 10-325 MG tablet Take 1 tablet by mouth every 6 (six) hours as needed for severe pain (pain score 7-10).   lisinopril-hydrochlorothiazide (ZESTORETIC) 10-12.5 MG tablet Take 1 tablet by mouth daily.   pregabalin (LYRICA) 150 MG capsule Take 1 capsule (150 mg total) by mouth 2 (two) times daily.   sulfaSALAzine (AZULFIDINE) 500 MG tablet Take 2 tablets (1,000 mg total) by mouth 2 (two) times daily.   timolol (TIMOPTIC) 0.5 % ophthalmic solution Place 1 drop into both eyes 2 (two) times daily.   No facility-administered encounter  medications on file as of 12/08/2022.     SIGNIFICANT DIAGNOSTIC EXAMS   06-08-22: hgb A1c 5.7; chol 111; ldl 55; trig 107; hdl 35 11-29-22: wbc 5.6; hgb 16.9; hct 53.4; mcv 105.5 plt 65; glucose 118; bun 46; creat 2.00; k+ 4.4; na++ 141; ca 8.7; gfr 55; protein 6.2 albumin 3.2 12-02-22: wbc 5.8; hgb 16.6; hct54.1; mcv 105.3 plt 65  Review of Systems  Constitutional:  Negative for malaise/fatigue.  Respiratory:  Negative for cough and shortness of breath.   Cardiovascular:  Negative for chest pain, palpitations and leg swelling.  Gastrointestinal:  Negative for abdominal pain, constipation and heartburn.  Musculoskeletal:  Positive for back pain. Negative for joint pain and myalgias.       Back pain is chronic   Skin: Negative.   Neurological:  Negative for dizziness.  Psychiatric/Behavioral:  The patient is not nervous/anxious.    Physical Exam Constitutional:      General: He is not in acute distress.    Appearance: He is well-developed. He is not diaphoretic.  Neck:     Thyroid: No thyromegaly.  Cardiovascular:     Rate and Rhythm: Normal rate and regular rhythm.     Heart sounds: Normal heart sounds.  Pulmonary:     Effort: Pulmonary effort is normal. No respiratory distress.     Breath sounds: Normal breath sounds.  Abdominal:     General: Bowel sounds are normal. There is no distension.     Palpations: Abdomen is soft.     Tenderness: There is no abdominal tenderness.  Musculoskeletal:        General: Normal range of motion.     Cervical back: Neck supple.  Lymphadenopathy:     Cervical: No cervical adenopathy.  Skin:    General: Skin is warm and dry.  Neurological:     Mental Status: He is alert and oriented to person, place, and time.  Psychiatric:        Mood and Affect: Mood normal.       ASSESSMENT/ PLAN:   Patient is being discharged with the following home health services:  pt/ot to evaluate and treat as indicated for gait balance strength adl  training   Patient is being discharged with the following durable medical equipment:  none needed   Patient has been advised to f/u with their PCP in 1-2 weeks to for a transitions of care visit.  Social services at their facility was responsible for arranging this appointment.  Pt was provided with adequate prescriptions of  noncontrolled medications to reach the scheduled appointment .  For controlled substances, a limited supply was provided as appropriate for the individual patient.  If the pt normally receives these medications from a pain clinic or has a contract with another physician, these medications should be received from that clinic or physician only).    A 30 day supply of his prescription medications have been sent to: [facility]  Time spent with patient: 40 minutes: medications; dme; home health.    Synthia Innocent NP [facility]  call [phone]

## 2022-12-21 DIAGNOSIS — G459 Transient cerebral ischemic attack, unspecified: Secondary | ICD-10-CM | POA: Diagnosis not present

## 2022-12-21 DIAGNOSIS — M130 Polyarthritis, unspecified: Secondary | ICD-10-CM | POA: Diagnosis not present

## 2022-12-21 DIAGNOSIS — Z96643 Presence of artificial hip joint, bilateral: Secondary | ICD-10-CM | POA: Diagnosis not present

## 2022-12-21 DIAGNOSIS — I5032 Chronic diastolic (congestive) heart failure: Secondary | ICD-10-CM | POA: Diagnosis not present

## 2022-12-21 DIAGNOSIS — M069 Rheumatoid arthritis, unspecified: Secondary | ICD-10-CM | POA: Diagnosis not present

## 2022-12-21 DIAGNOSIS — N182 Chronic kidney disease, stage 2 (mild): Secondary | ICD-10-CM | POA: Diagnosis not present

## 2022-12-21 DIAGNOSIS — Z8701 Personal history of pneumonia (recurrent): Secondary | ICD-10-CM | POA: Diagnosis not present

## 2022-12-21 DIAGNOSIS — Z9981 Dependence on supplemental oxygen: Secondary | ICD-10-CM | POA: Diagnosis not present

## 2022-12-21 DIAGNOSIS — H5462 Unqualified visual loss, left eye, normal vision right eye: Secondary | ICD-10-CM | POA: Diagnosis not present

## 2022-12-21 DIAGNOSIS — D696 Thrombocytopenia, unspecified: Secondary | ICD-10-CM | POA: Diagnosis not present

## 2022-12-21 DIAGNOSIS — M549 Dorsalgia, unspecified: Secondary | ICD-10-CM | POA: Diagnosis not present

## 2022-12-21 DIAGNOSIS — E785 Hyperlipidemia, unspecified: Secondary | ICD-10-CM | POA: Diagnosis not present

## 2022-12-21 DIAGNOSIS — Z79899 Other long term (current) drug therapy: Secondary | ICD-10-CM | POA: Diagnosis not present

## 2022-12-21 DIAGNOSIS — D751 Secondary polycythemia: Secondary | ICD-10-CM | POA: Diagnosis not present

## 2022-12-21 DIAGNOSIS — Z7982 Long term (current) use of aspirin: Secondary | ICD-10-CM | POA: Diagnosis not present

## 2022-12-21 DIAGNOSIS — Z8616 Personal history of COVID-19: Secondary | ICD-10-CM | POA: Diagnosis not present

## 2022-12-21 DIAGNOSIS — Z86711 Personal history of pulmonary embolism: Secondary | ICD-10-CM | POA: Diagnosis not present

## 2022-12-21 DIAGNOSIS — Z9181 History of falling: Secondary | ICD-10-CM | POA: Diagnosis not present

## 2022-12-21 DIAGNOSIS — M810 Age-related osteoporosis without current pathological fracture: Secondary | ICD-10-CM | POA: Diagnosis not present

## 2022-12-21 DIAGNOSIS — Z96653 Presence of artificial knee joint, bilateral: Secondary | ICD-10-CM | POA: Diagnosis not present

## 2022-12-21 DIAGNOSIS — I129 Hypertensive chronic kidney disease with stage 1 through stage 4 chronic kidney disease, or unspecified chronic kidney disease: Secondary | ICD-10-CM | POA: Diagnosis not present

## 2022-12-21 DIAGNOSIS — G894 Chronic pain syndrome: Secondary | ICD-10-CM | POA: Diagnosis not present

## 2022-12-21 DIAGNOSIS — I480 Paroxysmal atrial fibrillation: Secondary | ICD-10-CM | POA: Diagnosis not present

## 2022-12-21 DIAGNOSIS — J984 Other disorders of lung: Secondary | ICD-10-CM | POA: Diagnosis not present

## 2022-12-21 DIAGNOSIS — Z0000 Encounter for general adult medical examination without abnormal findings: Secondary | ICD-10-CM | POA: Diagnosis not present

## 2022-12-21 DIAGNOSIS — Z87891 Personal history of nicotine dependence: Secondary | ICD-10-CM | POA: Diagnosis not present

## 2022-12-21 DIAGNOSIS — I700 Atherosclerosis of aorta: Secondary | ICD-10-CM | POA: Diagnosis not present

## 2022-12-21 DIAGNOSIS — Z23 Encounter for immunization: Secondary | ICD-10-CM | POA: Diagnosis not present

## 2022-12-21 DIAGNOSIS — Z8673 Personal history of transient ischemic attack (TIA), and cerebral infarction without residual deficits: Secondary | ICD-10-CM | POA: Diagnosis not present

## 2022-12-21 DIAGNOSIS — I110 Hypertensive heart disease with heart failure: Secondary | ICD-10-CM | POA: Diagnosis not present

## 2022-12-21 DIAGNOSIS — K219 Gastro-esophageal reflux disease without esophagitis: Secondary | ICD-10-CM | POA: Diagnosis not present

## 2022-12-21 DIAGNOSIS — Z85828 Personal history of other malignant neoplasm of skin: Secondary | ICD-10-CM | POA: Diagnosis not present

## 2022-12-23 DIAGNOSIS — Z85828 Personal history of other malignant neoplasm of skin: Secondary | ICD-10-CM | POA: Diagnosis not present

## 2022-12-23 DIAGNOSIS — Z96653 Presence of artificial knee joint, bilateral: Secondary | ICD-10-CM | POA: Diagnosis not present

## 2022-12-23 DIAGNOSIS — M069 Rheumatoid arthritis, unspecified: Secondary | ICD-10-CM | POA: Diagnosis not present

## 2022-12-23 DIAGNOSIS — I110 Hypertensive heart disease with heart failure: Secondary | ICD-10-CM | POA: Diagnosis not present

## 2022-12-23 DIAGNOSIS — Z96643 Presence of artificial hip joint, bilateral: Secondary | ICD-10-CM | POA: Diagnosis not present

## 2022-12-23 DIAGNOSIS — J984 Other disorders of lung: Secondary | ICD-10-CM | POA: Diagnosis not present

## 2022-12-23 DIAGNOSIS — I700 Atherosclerosis of aorta: Secondary | ICD-10-CM | POA: Diagnosis not present

## 2022-12-23 DIAGNOSIS — D696 Thrombocytopenia, unspecified: Secondary | ICD-10-CM | POA: Diagnosis not present

## 2022-12-23 DIAGNOSIS — K219 Gastro-esophageal reflux disease without esophagitis: Secondary | ICD-10-CM | POA: Diagnosis not present

## 2022-12-23 DIAGNOSIS — M549 Dorsalgia, unspecified: Secondary | ICD-10-CM | POA: Diagnosis not present

## 2022-12-23 DIAGNOSIS — I480 Paroxysmal atrial fibrillation: Secondary | ICD-10-CM | POA: Diagnosis not present

## 2022-12-23 DIAGNOSIS — I5032 Chronic diastolic (congestive) heart failure: Secondary | ICD-10-CM | POA: Diagnosis not present

## 2022-12-23 DIAGNOSIS — G894 Chronic pain syndrome: Secondary | ICD-10-CM | POA: Diagnosis not present

## 2022-12-23 DIAGNOSIS — D751 Secondary polycythemia: Secondary | ICD-10-CM | POA: Diagnosis not present

## 2022-12-23 DIAGNOSIS — H5462 Unqualified visual loss, left eye, normal vision right eye: Secondary | ICD-10-CM | POA: Diagnosis not present

## 2022-12-23 DIAGNOSIS — E785 Hyperlipidemia, unspecified: Secondary | ICD-10-CM | POA: Diagnosis not present

## 2022-12-29 DIAGNOSIS — J984 Other disorders of lung: Secondary | ICD-10-CM | POA: Diagnosis not present

## 2022-12-29 DIAGNOSIS — E785 Hyperlipidemia, unspecified: Secondary | ICD-10-CM | POA: Diagnosis not present

## 2022-12-29 DIAGNOSIS — D751 Secondary polycythemia: Secondary | ICD-10-CM | POA: Diagnosis not present

## 2022-12-29 DIAGNOSIS — K219 Gastro-esophageal reflux disease without esophagitis: Secondary | ICD-10-CM | POA: Diagnosis not present

## 2022-12-29 DIAGNOSIS — D696 Thrombocytopenia, unspecified: Secondary | ICD-10-CM | POA: Diagnosis not present

## 2022-12-29 DIAGNOSIS — Z85828 Personal history of other malignant neoplasm of skin: Secondary | ICD-10-CM | POA: Diagnosis not present

## 2022-12-29 DIAGNOSIS — Z96643 Presence of artificial hip joint, bilateral: Secondary | ICD-10-CM | POA: Diagnosis not present

## 2022-12-29 DIAGNOSIS — M069 Rheumatoid arthritis, unspecified: Secondary | ICD-10-CM | POA: Diagnosis not present

## 2022-12-29 DIAGNOSIS — H5462 Unqualified visual loss, left eye, normal vision right eye: Secondary | ICD-10-CM | POA: Diagnosis not present

## 2022-12-29 DIAGNOSIS — Z96653 Presence of artificial knee joint, bilateral: Secondary | ICD-10-CM | POA: Diagnosis not present

## 2022-12-29 DIAGNOSIS — I5032 Chronic diastolic (congestive) heart failure: Secondary | ICD-10-CM | POA: Diagnosis not present

## 2022-12-29 DIAGNOSIS — I700 Atherosclerosis of aorta: Secondary | ICD-10-CM | POA: Diagnosis not present

## 2022-12-29 DIAGNOSIS — I110 Hypertensive heart disease with heart failure: Secondary | ICD-10-CM | POA: Diagnosis not present

## 2022-12-29 DIAGNOSIS — G894 Chronic pain syndrome: Secondary | ICD-10-CM | POA: Diagnosis not present

## 2022-12-29 DIAGNOSIS — I480 Paroxysmal atrial fibrillation: Secondary | ICD-10-CM | POA: Diagnosis not present

## 2022-12-29 DIAGNOSIS — M549 Dorsalgia, unspecified: Secondary | ICD-10-CM | POA: Diagnosis not present

## 2023-01-05 DIAGNOSIS — E785 Hyperlipidemia, unspecified: Secondary | ICD-10-CM | POA: Diagnosis not present

## 2023-01-05 DIAGNOSIS — J984 Other disorders of lung: Secondary | ICD-10-CM | POA: Diagnosis not present

## 2023-01-05 DIAGNOSIS — H5462 Unqualified visual loss, left eye, normal vision right eye: Secondary | ICD-10-CM | POA: Diagnosis not present

## 2023-01-05 DIAGNOSIS — I5032 Chronic diastolic (congestive) heart failure: Secondary | ICD-10-CM | POA: Diagnosis not present

## 2023-01-05 DIAGNOSIS — K219 Gastro-esophageal reflux disease without esophagitis: Secondary | ICD-10-CM | POA: Diagnosis not present

## 2023-01-05 DIAGNOSIS — Z96643 Presence of artificial hip joint, bilateral: Secondary | ICD-10-CM | POA: Diagnosis not present

## 2023-01-05 DIAGNOSIS — I700 Atherosclerosis of aorta: Secondary | ICD-10-CM | POA: Diagnosis not present

## 2023-01-05 DIAGNOSIS — D751 Secondary polycythemia: Secondary | ICD-10-CM | POA: Diagnosis not present

## 2023-01-05 DIAGNOSIS — M069 Rheumatoid arthritis, unspecified: Secondary | ICD-10-CM | POA: Diagnosis not present

## 2023-01-05 DIAGNOSIS — I480 Paroxysmal atrial fibrillation: Secondary | ICD-10-CM | POA: Diagnosis not present

## 2023-01-05 DIAGNOSIS — G894 Chronic pain syndrome: Secondary | ICD-10-CM | POA: Diagnosis not present

## 2023-01-05 DIAGNOSIS — D696 Thrombocytopenia, unspecified: Secondary | ICD-10-CM | POA: Diagnosis not present

## 2023-01-05 DIAGNOSIS — Z85828 Personal history of other malignant neoplasm of skin: Secondary | ICD-10-CM | POA: Diagnosis not present

## 2023-01-05 DIAGNOSIS — I110 Hypertensive heart disease with heart failure: Secondary | ICD-10-CM | POA: Diagnosis not present

## 2023-01-05 DIAGNOSIS — Z96653 Presence of artificial knee joint, bilateral: Secondary | ICD-10-CM | POA: Diagnosis not present

## 2023-01-05 DIAGNOSIS — M549 Dorsalgia, unspecified: Secondary | ICD-10-CM | POA: Diagnosis not present

## 2023-01-06 DIAGNOSIS — J449 Chronic obstructive pulmonary disease, unspecified: Secondary | ICD-10-CM | POA: Diagnosis not present

## 2023-01-07 DIAGNOSIS — M4722 Other spondylosis with radiculopathy, cervical region: Secondary | ICD-10-CM | POA: Diagnosis not present

## 2023-01-07 DIAGNOSIS — G894 Chronic pain syndrome: Secondary | ICD-10-CM | POA: Diagnosis not present

## 2023-01-07 DIAGNOSIS — Z79891 Long term (current) use of opiate analgesic: Secondary | ICD-10-CM | POA: Diagnosis not present

## 2023-01-07 DIAGNOSIS — M4726 Other spondylosis with radiculopathy, lumbar region: Secondary | ICD-10-CM | POA: Diagnosis not present

## 2023-01-11 DIAGNOSIS — M549 Dorsalgia, unspecified: Secondary | ICD-10-CM | POA: Diagnosis not present

## 2023-01-11 DIAGNOSIS — M069 Rheumatoid arthritis, unspecified: Secondary | ICD-10-CM | POA: Diagnosis not present

## 2023-01-11 DIAGNOSIS — I5032 Chronic diastolic (congestive) heart failure: Secondary | ICD-10-CM | POA: Diagnosis not present

## 2023-01-11 DIAGNOSIS — E785 Hyperlipidemia, unspecified: Secondary | ICD-10-CM | POA: Diagnosis not present

## 2023-01-11 DIAGNOSIS — K219 Gastro-esophageal reflux disease without esophagitis: Secondary | ICD-10-CM | POA: Diagnosis not present

## 2023-01-11 DIAGNOSIS — Z96643 Presence of artificial hip joint, bilateral: Secondary | ICD-10-CM | POA: Diagnosis not present

## 2023-01-11 DIAGNOSIS — I110 Hypertensive heart disease with heart failure: Secondary | ICD-10-CM | POA: Diagnosis not present

## 2023-01-11 DIAGNOSIS — D696 Thrombocytopenia, unspecified: Secondary | ICD-10-CM | POA: Diagnosis not present

## 2023-01-11 DIAGNOSIS — D751 Secondary polycythemia: Secondary | ICD-10-CM | POA: Diagnosis not present

## 2023-01-11 DIAGNOSIS — I480 Paroxysmal atrial fibrillation: Secondary | ICD-10-CM | POA: Diagnosis not present

## 2023-01-11 DIAGNOSIS — I700 Atherosclerosis of aorta: Secondary | ICD-10-CM | POA: Diagnosis not present

## 2023-01-11 DIAGNOSIS — J984 Other disorders of lung: Secondary | ICD-10-CM | POA: Diagnosis not present

## 2023-01-11 DIAGNOSIS — H5462 Unqualified visual loss, left eye, normal vision right eye: Secondary | ICD-10-CM | POA: Diagnosis not present

## 2023-01-11 DIAGNOSIS — Z85828 Personal history of other malignant neoplasm of skin: Secondary | ICD-10-CM | POA: Diagnosis not present

## 2023-01-11 DIAGNOSIS — Z96653 Presence of artificial knee joint, bilateral: Secondary | ICD-10-CM | POA: Diagnosis not present

## 2023-01-11 DIAGNOSIS — G894 Chronic pain syndrome: Secondary | ICD-10-CM | POA: Diagnosis not present

## 2023-01-19 DIAGNOSIS — H401123 Primary open-angle glaucoma, left eye, severe stage: Secondary | ICD-10-CM | POA: Diagnosis not present

## 2023-01-21 DIAGNOSIS — J984 Other disorders of lung: Secondary | ICD-10-CM | POA: Diagnosis not present

## 2023-01-21 DIAGNOSIS — D696 Thrombocytopenia, unspecified: Secondary | ICD-10-CM | POA: Diagnosis not present

## 2023-01-21 DIAGNOSIS — G894 Chronic pain syndrome: Secondary | ICD-10-CM | POA: Diagnosis not present

## 2023-01-21 DIAGNOSIS — Z9981 Dependence on supplemental oxygen: Secondary | ICD-10-CM | POA: Diagnosis not present

## 2023-01-21 DIAGNOSIS — Z86711 Personal history of pulmonary embolism: Secondary | ICD-10-CM | POA: Diagnosis not present

## 2023-01-21 DIAGNOSIS — Z87891 Personal history of nicotine dependence: Secondary | ICD-10-CM | POA: Diagnosis not present

## 2023-01-21 DIAGNOSIS — M549 Dorsalgia, unspecified: Secondary | ICD-10-CM | POA: Diagnosis not present

## 2023-01-21 DIAGNOSIS — Z96643 Presence of artificial hip joint, bilateral: Secondary | ICD-10-CM | POA: Diagnosis not present

## 2023-01-21 DIAGNOSIS — M069 Rheumatoid arthritis, unspecified: Secondary | ICD-10-CM | POA: Diagnosis not present

## 2023-01-21 DIAGNOSIS — E785 Hyperlipidemia, unspecified: Secondary | ICD-10-CM | POA: Diagnosis not present

## 2023-01-21 DIAGNOSIS — I5032 Chronic diastolic (congestive) heart failure: Secondary | ICD-10-CM | POA: Diagnosis not present

## 2023-01-21 DIAGNOSIS — Z8673 Personal history of transient ischemic attack (TIA), and cerebral infarction without residual deficits: Secondary | ICD-10-CM | POA: Diagnosis not present

## 2023-01-21 DIAGNOSIS — I480 Paroxysmal atrial fibrillation: Secondary | ICD-10-CM | POA: Diagnosis not present

## 2023-01-21 DIAGNOSIS — Z9181 History of falling: Secondary | ICD-10-CM | POA: Diagnosis not present

## 2023-01-21 DIAGNOSIS — Z8701 Personal history of pneumonia (recurrent): Secondary | ICD-10-CM | POA: Diagnosis not present

## 2023-01-21 DIAGNOSIS — Z7982 Long term (current) use of aspirin: Secondary | ICD-10-CM | POA: Diagnosis not present

## 2023-01-21 DIAGNOSIS — I700 Atherosclerosis of aorta: Secondary | ICD-10-CM | POA: Diagnosis not present

## 2023-01-21 DIAGNOSIS — K219 Gastro-esophageal reflux disease without esophagitis: Secondary | ICD-10-CM | POA: Diagnosis not present

## 2023-01-21 DIAGNOSIS — Z85828 Personal history of other malignant neoplasm of skin: Secondary | ICD-10-CM | POA: Diagnosis not present

## 2023-01-21 DIAGNOSIS — D751 Secondary polycythemia: Secondary | ICD-10-CM | POA: Diagnosis not present

## 2023-01-21 DIAGNOSIS — Z96653 Presence of artificial knee joint, bilateral: Secondary | ICD-10-CM | POA: Diagnosis not present

## 2023-01-21 DIAGNOSIS — H5462 Unqualified visual loss, left eye, normal vision right eye: Secondary | ICD-10-CM | POA: Diagnosis not present

## 2023-01-21 DIAGNOSIS — I110 Hypertensive heart disease with heart failure: Secondary | ICD-10-CM | POA: Diagnosis not present

## 2023-01-21 DIAGNOSIS — Z8616 Personal history of COVID-19: Secondary | ICD-10-CM | POA: Diagnosis not present

## 2023-01-25 DIAGNOSIS — G894 Chronic pain syndrome: Secondary | ICD-10-CM | POA: Diagnosis not present

## 2023-01-25 DIAGNOSIS — E785 Hyperlipidemia, unspecified: Secondary | ICD-10-CM | POA: Diagnosis not present

## 2023-01-25 DIAGNOSIS — D696 Thrombocytopenia, unspecified: Secondary | ICD-10-CM | POA: Diagnosis not present

## 2023-01-25 DIAGNOSIS — M549 Dorsalgia, unspecified: Secondary | ICD-10-CM | POA: Diagnosis not present

## 2023-01-25 DIAGNOSIS — D751 Secondary polycythemia: Secondary | ICD-10-CM | POA: Diagnosis not present

## 2023-01-25 DIAGNOSIS — I700 Atherosclerosis of aorta: Secondary | ICD-10-CM | POA: Diagnosis not present

## 2023-01-25 DIAGNOSIS — I480 Paroxysmal atrial fibrillation: Secondary | ICD-10-CM | POA: Diagnosis not present

## 2023-01-25 DIAGNOSIS — Z85828 Personal history of other malignant neoplasm of skin: Secondary | ICD-10-CM | POA: Diagnosis not present

## 2023-01-25 DIAGNOSIS — H5462 Unqualified visual loss, left eye, normal vision right eye: Secondary | ICD-10-CM | POA: Diagnosis not present

## 2023-01-25 DIAGNOSIS — J984 Other disorders of lung: Secondary | ICD-10-CM | POA: Diagnosis not present

## 2023-01-25 DIAGNOSIS — K219 Gastro-esophageal reflux disease without esophagitis: Secondary | ICD-10-CM | POA: Diagnosis not present

## 2023-01-25 DIAGNOSIS — M069 Rheumatoid arthritis, unspecified: Secondary | ICD-10-CM | POA: Diagnosis not present

## 2023-01-25 DIAGNOSIS — I5032 Chronic diastolic (congestive) heart failure: Secondary | ICD-10-CM | POA: Diagnosis not present

## 2023-01-25 DIAGNOSIS — Z96653 Presence of artificial knee joint, bilateral: Secondary | ICD-10-CM | POA: Diagnosis not present

## 2023-01-25 DIAGNOSIS — I110 Hypertensive heart disease with heart failure: Secondary | ICD-10-CM | POA: Diagnosis not present

## 2023-01-25 DIAGNOSIS — Z96643 Presence of artificial hip joint, bilateral: Secondary | ICD-10-CM | POA: Diagnosis not present

## 2023-02-03 DIAGNOSIS — K219 Gastro-esophageal reflux disease without esophagitis: Secondary | ICD-10-CM | POA: Diagnosis not present

## 2023-02-03 DIAGNOSIS — G894 Chronic pain syndrome: Secondary | ICD-10-CM | POA: Diagnosis not present

## 2023-02-03 DIAGNOSIS — Z96653 Presence of artificial knee joint, bilateral: Secondary | ICD-10-CM | POA: Diagnosis not present

## 2023-02-03 DIAGNOSIS — M549 Dorsalgia, unspecified: Secondary | ICD-10-CM | POA: Diagnosis not present

## 2023-02-03 DIAGNOSIS — H5462 Unqualified visual loss, left eye, normal vision right eye: Secondary | ICD-10-CM | POA: Diagnosis not present

## 2023-02-03 DIAGNOSIS — I5032 Chronic diastolic (congestive) heart failure: Secondary | ICD-10-CM | POA: Diagnosis not present

## 2023-02-03 DIAGNOSIS — Z85828 Personal history of other malignant neoplasm of skin: Secondary | ICD-10-CM | POA: Diagnosis not present

## 2023-02-03 DIAGNOSIS — J984 Other disorders of lung: Secondary | ICD-10-CM | POA: Diagnosis not present

## 2023-02-03 DIAGNOSIS — D696 Thrombocytopenia, unspecified: Secondary | ICD-10-CM | POA: Diagnosis not present

## 2023-02-03 DIAGNOSIS — I110 Hypertensive heart disease with heart failure: Secondary | ICD-10-CM | POA: Diagnosis not present

## 2023-02-03 DIAGNOSIS — Z96643 Presence of artificial hip joint, bilateral: Secondary | ICD-10-CM | POA: Diagnosis not present

## 2023-02-03 DIAGNOSIS — M069 Rheumatoid arthritis, unspecified: Secondary | ICD-10-CM | POA: Diagnosis not present

## 2023-02-03 DIAGNOSIS — D751 Secondary polycythemia: Secondary | ICD-10-CM | POA: Diagnosis not present

## 2023-02-03 DIAGNOSIS — I700 Atherosclerosis of aorta: Secondary | ICD-10-CM | POA: Diagnosis not present

## 2023-02-03 DIAGNOSIS — I480 Paroxysmal atrial fibrillation: Secondary | ICD-10-CM | POA: Diagnosis not present

## 2023-02-03 DIAGNOSIS — E785 Hyperlipidemia, unspecified: Secondary | ICD-10-CM | POA: Diagnosis not present

## 2023-02-05 DIAGNOSIS — J449 Chronic obstructive pulmonary disease, unspecified: Secondary | ICD-10-CM | POA: Diagnosis not present

## 2023-03-04 DIAGNOSIS — M4722 Other spondylosis with radiculopathy, cervical region: Secondary | ICD-10-CM | POA: Diagnosis not present

## 2023-03-04 DIAGNOSIS — Z79891 Long term (current) use of opiate analgesic: Secondary | ICD-10-CM | POA: Diagnosis not present

## 2023-03-04 DIAGNOSIS — G894 Chronic pain syndrome: Secondary | ICD-10-CM | POA: Diagnosis not present

## 2023-03-04 DIAGNOSIS — M4726 Other spondylosis with radiculopathy, lumbar region: Secondary | ICD-10-CM | POA: Diagnosis not present

## 2023-03-29 ENCOUNTER — Encounter: Payer: Self-pay | Admitting: Pulmonary Disease

## 2023-03-29 ENCOUNTER — Ambulatory Visit: Payer: Medicare Other | Admitting: Pulmonary Disease

## 2023-03-29 VITALS — BP 123/82 | HR 83 | Ht 72.0 in | Wt 213.6 lb

## 2023-03-29 DIAGNOSIS — G8929 Other chronic pain: Secondary | ICD-10-CM | POA: Diagnosis not present

## 2023-03-29 DIAGNOSIS — I5030 Unspecified diastolic (congestive) heart failure: Secondary | ICD-10-CM

## 2023-03-29 DIAGNOSIS — G4734 Idiopathic sleep related nonobstructive alveolar hypoventilation: Secondary | ICD-10-CM | POA: Diagnosis not present

## 2023-03-29 DIAGNOSIS — Z77090 Contact with and (suspected) exposure to asbestos: Secondary | ICD-10-CM

## 2023-03-29 DIAGNOSIS — Z8673 Personal history of transient ischemic attack (TIA), and cerebral infarction without residual deficits: Secondary | ICD-10-CM

## 2023-03-29 NOTE — Patient Instructions (Addendum)
 CT chest without contrast  Pulmonary function test with ABG  Ambulate to check oxygen  levels  Echocardiogram  Sleep study-split-night study  Follow-up in 6 to 8 weeks  Call us  with significant concerns

## 2023-03-29 NOTE — Progress Notes (Signed)
 Douglas [name]    [id number]    05/19/1950  Primary Care Physician:Pradhan, Kathalene Pali, MD  Referring Physician: Eilene Grater, MD [address] [place],  [place] [address]  Chief complaint:   Chronic shortness of breath Musculoskeletal pain and deconditioning  HPI:  History of heart failure with preserved ejection fraction, right-sided cardiac dysfunction Atrial fibrillation History of PE previously  Quit smoking more than 40 years ago  Was prescribed oxygen  supplementation at night previously Prescribed oxygen  supplementation during the day  Because of gait unsteadiness and difficulty ambulating, has not been able to use oxygen  around-the-clock  Recent evaluation in the hospital for possible TIA  Reformed smoker, quit over 40 years ago 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  His primary care doctor was concerned about hypoventilation at night  He is on opiates and Lyrica  -His primary doctor is concerned about his doses of opiates  Spouse denies snoring, he feels he rests well but tired during the day  Was prescribed oxygen  at night a few years back Prescribed oxygen  during the day which he only uses whenever he is at home, he has difficulty with his gait and ambulation which makes oxygen  supplementation with activity more difficult He does not have a portable device at present    Outpatient Encounter Medications as of 03/29/2023  Medication Sig   acetaminophen  (TYLENOL ) 325 MG tablet Take 2 tablets (650 mg total) by mouth every 6 (six) hours as needed for mild pain (pain score 1-3) (or Fever >/= 101).   atorvastatin  (LIPITOR) 40 MG tablet Take 1 tablet (40 mg total) by mouth at bedtime.   brimonidine  (ALPHAGAN ) 0.2 % ophthalmic solution Place 1 drop into both eyes 2 (two) times daily.   famotidine  (PEPCID ) 20 MG tablet Take 20 mg by mouth 2 (two) times daily.   hydrALAZINE  (APRESOLINE ) 50 MG tablet Take 0.5 tablets (25 mg  total) by mouth 3 (three) times daily.   HYDROcodone -acetaminophen  (NORCO) 10-325 MG tablet Take 1 tablet by mouth every 6 (six) hours as needed for severe pain (pain score 7-10).   lisinopril -hydrochlorothiazide  (ZESTORETIC ) 10-12.5 MG tablet Take 1 tablet by mouth daily.   pregabalin  (LYRICA ) 150 MG capsule Take 1 capsule (150 mg total) by mouth 2 (two) times daily.   sulfaSALAzine  (AZULFIDINE ) 500 MG tablet Take 2 tablets (1,000 mg total) by mouth 2 (two) times daily.   timolol  (TIMOPTIC ) 0.5 % ophthalmic solution Place 1 drop into both eyes 2 (two) times daily.   amLODipine  (NORVASC ) 5 MG tablet Take 1 tablet (5 mg total) by mouth daily. (Patient not taking: Reported on 03/29/2023)   No facility-administered encounter medications on file as of 03/29/2023.    Allergies as of 03/29/2023 - Review Complete 03/29/2023  Allergen Reaction Noted   Ancef [cefazolin] Rash 08/24/2012   Penicillins Hives 11/24/2011   Firvanq [vancomycin] Rash 08/22/2012   Hydrochlorothiazide  Rash 08/11/2013   Ms contin [morphine] Rash 02/19/2020   Other Rash 08/11/2013   Oxycontin [oxycodone] Rash 02/19/2020    Past Medical History:  Diagnosis Date   Basal cell carcinoma 01/24/2014   left temple tx mohs Dr Leshin 03/12/2014   Chronic pain    Hypertension     Past Surgical History:  Procedure Laterality Date   BACK SURGERY     x 3   REPLACEMENT TOTAL KNEE BILATERAL     TOTAL HIP ARTHROPLASTY     bilateral    No family history on  file.  Social History   Socioeconomic History   Marital status: Married    Spouse name: Not on file   Number of children: Not on file   Years of education: Not on file   Highest education level: Not on file  Occupational History   Not on file  Tobacco Use   Smoking status: Former   Smokeless tobacco: Never  Substance and Sexual Activity   Alcohol use: No   Drug use: No   Sexual activity: Not on file  Other Topics Concern   Not on file  Social History Narrative    Not on file   Social Drivers of Health   Financial Resource Strain: Not on file  Food Insecurity: No Food Insecurity (11/29/2022)   Hunger Vital Sign    Worried About Running Out of Food in the Last Year: Never true    Ran Out of Food in the Last Year: Never true  Transportation Needs: No Transportation Needs (11/29/2022)   PRAPARE - Administrator, Civil Service (Medical): No    Lack of Transportation (Non-Medical): No  Physical Activity: Not on file  Stress: Not on file  Social Connections: Not on file  Intimate Partner Violence: Not At Risk (11/29/2022)   Humiliation, Afraid, Rape, and Kick questionnaire    Fear of Current or Ex-Partner: No    Emotionally Abused: No    Physically Abused: No    Sexually Abused: No    Review of Systems  Constitutional:  Positive for fatigue.  Respiratory:  Positive for shortness of breath.   Psychiatric/Behavioral:  Positive for sleep disturbance.     Vitals:   03/29/23 0921  BP: 123/82  Pulse: 83  SpO2: 94%     Physical Exam Constitutional:      Appearance: Normal appearance.  HENT:     Head: Normocephalic.     Mouth/Throat:     Mouth: Mucous membranes are moist.  Eyes:     General: No scleral icterus. Cardiovascular:     Rate and Rhythm: Normal rate and regular rhythm.     Heart sounds: No murmur heard.    No friction rub.  Pulmonary:     Effort: No respiratory distress.     Breath sounds: No stridor. No wheezing or rhonchi.  Musculoskeletal:     Cervical back: No rigidity or tenderness.  Neurological:     Mental Status: He is alert.  Psychiatric:        Mood and Affect: Mood normal.      Data Reviewed: Records from recent hospitalization in October 2024 reviewed  No PFT on record  Echocardiogram April 2024 with right-sided cardiac dysfunction, diastolic dysfunction  Ambulating in the office today, did not desaturate immediately with any kind of ambulation, He was placed on 2 L, increased to 3 L  with significant activity Qualifies for oxygen  supplementation,  Assessment:  Hypoventilation  Chronic pain and discomfort  Significant musculoskeletal pain and discomfort causing deconditioning  History of recent stroke  Chronic respiratory failure for which he was prescribed oxygen  supplementation both during the day and at night previously  Reformed smoker, quit over 40 years ago, was exposed to asbestos from working in [employer], [employer]  Requires chronic opiates and ligations for control of pain and discomfort   Plan/Recommendations:  Obtain CT scan of the chest without contrast  Obtain echocardiogram  Obtain pulmonary function test with ABG  Ambulatory oxygen   6 to 8-week follow-up  Call us  with significant concerns  Order for portable oxygen , 2-3 l with activity, he does have a pulse ox to monitor his oxygen  levels  I spent 45 minutes dedicated to the care of this patient on the date of this encounter to include previsit review of records, face-to-face time with the patient discussing conditions above, post visit ordering of testing,ordering medications,independentlyinterpreting results, clinical documentation with electronic health record and communicated necessary findings to members of the patient's care team   Myer Artis MD [facility] 03/29/2023, 9:36 AM  CC: Eilene Grater, MD

## 2023-04-22 ENCOUNTER — Ambulatory Visit
Admission: RE | Admit: 2023-04-22 | Discharge: 2023-04-22 | Disposition: A | Discharge status: Home / Self Care | Payer: Medicare Other | Source: Ambulatory Visit | Attending: Pulmonary Disease | Admitting: Pulmonary Disease

## 2023-04-22 DIAGNOSIS — Z77090 Contact with and (suspected) exposure to asbestos: Secondary | ICD-10-CM

## 2023-04-27 ENCOUNTER — Ambulatory Visit (HOSPITAL_COMMUNITY): Payer: Medicare Other | Attending: Pulmonary Disease

## 2023-04-27 DIAGNOSIS — I5030 Unspecified diastolic (congestive) heart failure: Secondary | ICD-10-CM | POA: Insufficient documentation

## 2023-04-27 LAB — ECHOCARDIOGRAM COMPLETE
Area-P 1/2: 3.15 cm2
P 1/2 time: 541 ms
S' Lateral: 3.5 cm

## 2023-04-29 DIAGNOSIS — Z79891 Long term (current) use of opiate analgesic: Secondary | ICD-10-CM | POA: Diagnosis not present

## 2023-04-29 DIAGNOSIS — M4726 Other spondylosis with radiculopathy, lumbar region: Secondary | ICD-10-CM | POA: Diagnosis not present

## 2023-04-29 DIAGNOSIS — G894 Chronic pain syndrome: Secondary | ICD-10-CM | POA: Diagnosis not present

## 2023-04-29 DIAGNOSIS — M4722 Other spondylosis with radiculopathy, cervical region: Secondary | ICD-10-CM | POA: Diagnosis not present

## 2023-05-25 ENCOUNTER — Encounter (HOSPITAL_COMMUNITY)

## 2023-05-28 ENCOUNTER — Encounter (HOSPITAL_BASED_OUTPATIENT_CLINIC_OR_DEPARTMENT_OTHER): Payer: Medicare Other | Admitting: Pulmonary Disease

## 2023-06-01 ENCOUNTER — Telehealth: Payer: Self-pay | Admitting: Pulmonary Disease

## 2023-06-01 NOTE — Telephone Encounter (Signed)
 Does pts F/U have to be in [place]? Pts PFT is scheduled for [facility] on the same day as F/U appt with Ol[facility]. Called pt but had to leave a VM.   Please advise     Copied from CRM [id number]. Topic: Appointments - Scheduling Inquiry for Clinic >> Jun 01, 2023  9:25 AM Isabell A wrote: Reason for CRM: Valinda Gault at [facility] respiratory dept. Is calling in regard to patients follow up visit scheduled at 9am, before his PFT. Valinda Gault is requesting to change the appnt to a later time that day, after the PFT test.  Callback number: [phone] >> Jun 01, 2023 12:02 PM Glenora Laos B wrote: This patients follow up appt needs to be resc to after the pft

## 2023-06-02 NOTE — Telephone Encounter (Signed)
 There are no more openings with Olalere on 4/22, can he DB?

## 2023-06-02 NOTE — Telephone Encounter (Signed)
 Routing to front desk for scheduling

## 2023-06-08 ENCOUNTER — Telehealth: Payer: Self-pay | Admitting: Pulmonary Disease

## 2023-06-08 ENCOUNTER — Ambulatory Visit: Payer: Medicare Other | Admitting: Pulmonary Disease

## 2023-06-08 ENCOUNTER — Other Ambulatory Visit: Payer: Self-pay

## 2023-06-08 ENCOUNTER — Encounter (HOSPITAL_BASED_OUTPATIENT_CLINIC_OR_DEPARTMENT_OTHER): Payer: Self-pay | Admitting: Pulmonary Disease

## 2023-06-08 ENCOUNTER — Encounter: Payer: Self-pay | Admitting: Pulmonary Disease

## 2023-06-08 ENCOUNTER — Ambulatory Visit (HOSPITAL_COMMUNITY)
Admission: RE | Admit: 2023-06-08 | Discharge: 2023-06-08 | Disposition: A | Discharge status: Home / Self Care | Source: Ambulatory Visit | Attending: Pulmonary Disease | Admitting: Pulmonary Disease

## 2023-06-08 ENCOUNTER — Telehealth: Payer: Self-pay

## 2023-06-08 VITALS — BP 149/80 | HR 88 | Ht 72.0 in | Wt 215.0 lb

## 2023-06-08 DIAGNOSIS — I272 Pulmonary hypertension, unspecified: Secondary | ICD-10-CM | POA: Diagnosis not present

## 2023-06-08 DIAGNOSIS — R0902 Hypoxemia: Secondary | ICD-10-CM

## 2023-06-08 DIAGNOSIS — Z87891 Personal history of nicotine dependence: Secondary | ICD-10-CM

## 2023-06-08 DIAGNOSIS — R918 Other nonspecific abnormal finding of lung field: Secondary | ICD-10-CM

## 2023-06-08 DIAGNOSIS — G4734 Idiopathic sleep related nonobstructive alveolar hypoventilation: Secondary | ICD-10-CM | POA: Diagnosis not present

## 2023-06-08 DIAGNOSIS — R9389 Abnormal findings on diagnostic imaging of other specified body structures: Secondary | ICD-10-CM

## 2023-06-08 DIAGNOSIS — Z77090 Contact with and (suspected) exposure to asbestos: Secondary | ICD-10-CM | POA: Diagnosis not present

## 2023-06-08 DIAGNOSIS — J9611 Chronic respiratory failure with hypoxia: Secondary | ICD-10-CM

## 2023-06-08 DIAGNOSIS — R2991 Unspecified symptoms and signs involving the musculoskeletal system: Secondary | ICD-10-CM | POA: Diagnosis not present

## 2023-06-08 LAB — PULMONARY FUNCTION TEST
DL/VA % pred: 109 %
DL/VA: 4.29 ml/min/mmHg/L
DLCO unc % pred: 58 %
DLCO unc: 15.89 ml/min/mmHg
FEF 25-75 Post: 1.35 L/s
FEF 25-75 Pre: 1.05 L/s
FEF2575-%Change-Post: 28 %
FEF2575-%Pred-Post: 52 %
FEF2575-%Pred-Pre: 40 %
FEV1-%Change-Post: 5 %
FEV1-%Pred-Post: 44 %
FEV1-%Pred-Pre: 41 %
FEV1-Post: 1.51 L
FEV1-Pre: 1.44 L
FEV1FVC-%Change-Post: -1 %
FEV1FVC-%Pred-Pre: 101 %
FEV6-%Change-Post: 5 %
FEV6-%Pred-Post: 46 %
FEV6-%Pred-Pre: 43 %
FEV6-Post: 2.05 L
FEV6-Pre: 1.93 L
FEV6FVC-%Change-Post: -1 %
FEV6FVC-%Pred-Post: 104 %
FEV6FVC-%Pred-Pre: 105 %
FVC-%Change-Post: 7 %
FVC-%Pred-Post: 44 %
FVC-%Pred-Pre: 41 %
FVC-Post: 2.07 L
FVC-Pre: 1.93 L
Post FEV1/FVC ratio: 73 %
Post FEV6/FVC ratio: 99 %
Pre FEV1/FVC ratio: 74 %
Pre FEV6/FVC Ratio: 100 %
RV % pred: 131 %
RV: 3.41 L
TLC % pred: 75 %
TLC: 5.6 L

## 2023-06-08 LAB — BLOOD GAS, ARTERIAL
Acid-Base Excess: 16.6 mmol/L — ABNORMAL HIGH (ref 0.0–2.0)
Bicarbonate: 44.8 mmol/L — ABNORMAL HIGH (ref 20.0–28.0)
Drawn by: 560031
O2 Saturation: 87.1 %
Patient temperature: 37
pCO2 arterial: 69 mmHg (ref 32–48)
pH, Arterial: 7.42 (ref 7.35–7.45)
pO2, Arterial: 49 mmHg — ABNORMAL LOW (ref 83–108)

## 2023-06-08 MED ORDER — ALBUTEROL SULFATE (2.5 MG/3ML) 0.083% IN NEBU
2.5000 mg | INHALATION_SOLUTION | Freq: Once | RESPIRATORY_TRACT | Status: AC
  Administered 2023-06-08: 2.5 mg via RESPIRATORY_TRACT

## 2023-06-08 NOTE — Progress Notes (Signed)
 RT NOTE:  ABG drawn per PFT order set. Obtained at 1455, dropped off with lab at 1457.

## 2023-06-08 NOTE — Patient Instructions (Signed)
 I will see you back in about 3 months  We will repeat the CT scan of the chest to see what happens with the haziness in the lungs  You do have pulmonary hypertension - One of the things that can cause this is having sleep apnea -This is the reason for scheduling you for a sleep study  Continue using your oxygen  Follow-up with your breathing study We will contact the department to make sure you get a walking study to see whether you qualify for portable concentrator  Call us  with significant concerns

## 2023-06-08 NOTE — Telephone Encounter (Signed)
 Created by accident.

## 2023-06-08 NOTE — Telephone Encounter (Signed)
 Pt had ABG on RA this afternoon and RT called to let us  know with pt's PCO2 on RA was 69.   Routing to Dr Gaynell Keeler urgent

## 2023-06-08 NOTE — Progress Notes (Addendum)
 Douglas [name]    [id number]    [birth year]  Primary Care Physician:Pradhan, Kathalene Pali, MD  Referring Physician: Eilene Grater, MD [address] 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, did air conditioning work  His primary care doctor was concerned about hypoventilation at night  He is on opiates and Lyrica  -His primary doctor is concerned about his doses of opiates  Spouse denies snoring, he feels he rests well but tired during the day  Was prescribed oxygen at night a few years back Prescribed oxygen during the day which he only uses whenever he is at home, he has difficulty with his gait and ambulation which makes oxygen supplementation with activity more difficult He does not have a portable device at present  Outpatient Encounter Medications as of 06/08/2023  Medication Sig   acetaminophen  (TYLENOL ) 325 MG tablet Take 2 tablets (650 mg total) by mouth every 6 (six) hours as needed for mild pain (pain score 1-3) (or Fever >/= 101).   amLODipine  (NORVASC ) 5 MG tablet Take 1 tablet (5 mg total) by mouth daily.   atorvastatin  (LIPITOR) 40 MG tablet Take 1 tablet (40 mg total) by mouth at bedtime.   brimonidine  (ALPHAGAN ) 0.2 % ophthalmic solution Place 1 drop into both eyes 2 (two) times daily.   famotidine  (PEPCID ) 20 MG tablet Take by mouth.   hydrALAZINE  (APRESOLINE ) 50 MG tablet Take 0.5  tablets (25 mg total) by mouth 3 (three) times daily.   HYDROcodone -acetaminophen  (NORCO) 10-325 MG tablet Take 1 tablet by mouth every 6 (six) hours as needed for severe pain (pain score 7-10).   lisinopril -hydrochlorothiazide  (ZESTORETIC ) 10-12.5 MG tablet Take 1 tablet by mouth daily.   pregabalin  (LYRICA ) 150 MG capsule Take 1 capsule (150 mg total) by mouth 2 (two) times daily.   sulfaSALAzine  (AZULFIDINE ) 500 MG tablet Take 2 tablets (1,000 mg total) by mouth 2 (two) times daily.   timolol  (TIMOPTIC ) 0.5 % ophthalmic solution Place 1 drop into both eyes 2 (two) times daily.   Vitamin D, Ergocalciferol, (DRISDOL) 1.25 MG (50000 UNIT) CAPS capsule Take 50,000 Units by mouth once a week.   famotidine  (PEPCID ) 20 MG tablet Take 20 mg by mouth 2 (two) times daily. (Patient not taking: Reported on 06/08/2023)   No facility-administered encounter medications on file as of 06/08/2023.    Allergies as of 06/08/2023 - Review Complete 06/08/2023  Allergen Reaction Noted   Ancef [cefazolin] Rash 08/24/2012   Penicillins Hives 11/24/2011   Firvanq [vancomycin] Rash 08/22/2012   Hydrochlorothiazide  Rash 08/11/2013   Ms contin [morphine] Rash 02/19/2020   Other Rash 08/11/2013   Oxycontin [oxycodone] Rash 02/19/2020    Past Medical History:  Diagnosis Date   Basal cell carcinoma 01/24/2014   left temple tx mohs Dr Leshin 03/12/2014   Chronic pain    Hypertension     Past Surgical History:  Procedure Laterality Date   BACK SURGERY  x 3   REPLACEMENT TOTAL KNEE BILATERAL     TOTAL HIP ARTHROPLASTY     bilateral    No family history on file.  Social History   Socioeconomic History   Marital status: Married    Spouse name: Not on file   Number of children: Not on file   Years of education: Not on file   Highest education level: Not on file  Occupational History   Not on file  Tobacco Use   Smoking status: Former   Smokeless tobacco: Never  Substance and Sexual Activity    Alcohol use: No   Drug use: No   Sexual activity: Not on file  Other Topics Concern   Not on file  Social History Narrative   Not on file   Social Drivers of Health   Financial Resource Strain: Not on file  Food Insecurity: No Food Insecurity (11/29/2022)   Hunger Vital Sign    Worried About Running Out of Food in the Last Year: Never true    Ran Out of Food in the Last Year: Never true  Transportation Needs: No Transportation Needs (11/29/2022)   PRAPARE - Administrator, Civil Service (Medical): No    Lack of Transportation (Non-Medical): No  Physical Activity: Not on file  Stress: Not on file  Social Connections: Not on file  Intimate Partner Violence: Not At Risk (11/29/2022)   Humiliation, Afraid, Rape, and Kick questionnaire    Fear of Current or Ex-Partner: No    Emotionally Abused: No    Physically Abused: No    Sexually Abused: No    Review of Systems  Constitutional:  Positive for fatigue.  Respiratory:  Positive for shortness of breath.   Psychiatric/Behavioral:  Positive for sleep disturbance.     Vitals:   06/08/23 0843  BP: (!) 149/80  Pulse: 88  SpO2: (!) 79%     Physical Exam Constitutional:      Appearance: Normal appearance.  HENT:     Head: Normocephalic.     Mouth/Throat:     Mouth: Mucous membranes are moist.  Eyes:     General: No scleral icterus. Cardiovascular:     Rate and Rhythm: Normal rate and regular rhythm.     Heart sounds: No murmur heard.    No friction rub.  Pulmonary:     Effort: No respiratory distress.     Breath sounds: No stridor. No wheezing or rhonchi.  Musculoskeletal:     Cervical back: No rigidity or tenderness.  Neurological:     Mental Status: He is alert.  Psychiatric:        Mood and Affect: Mood normal.    Data Reviewed: Records from recent hospitalization in October 2024 reviewed  No PFT on record  Echocardiogram April 2024 with right-sided cardiac dysfunction, diastolic  dysfunction Echocardiogram 04/27/2023 reviewed showing normal ejection fraction, severe pulmonary hypertension  CT scan of the chest reviewed showing some groundglass changes, atelectasis in the right upper lobe  Sleep study reviewed showing moderate obstructive sleep apnea with very reduced sleep efficiency, titrated to BiPAP 18/12  Assessment:  Moderate obstructive sleep apnea Titrated to BiPAP 18/12  Abnormal CT scan of the chest showing right upper lobe infiltrate - Needs radiological follow-up  Pulmonary hypertension likely due to underlying lung disease and heart disease  Severe musculoskeletal pain and discomfort causing significant deconditioning  History of stroke  Chronic respiratory failure  Reformed smoker, quit over 40 years ago, was exposed to asbestos  from working in [employer], [employer]  Requires chronic opiates and ligations for control of pain and discomfort   Plan/Recommendations:  DME referral for BiPAP 18/12 Limitations associated with the study was discussed with the spouse today - Very poor sleep efficiency - Trial with BiPAP as appropriate  ABG performed today showing hypercapnic respiratory failure, compensated  Ambulatory oximetry could not be performed  Follow-up in 3 months  Follow-up CT scan in 3 months to follow-up infiltrate of the upper lobes of the lung to resolution  Continue oxygen supplementation  Encouraged to call with significant concerns   Myer Artis MD [facility] 06/08/2023, 9:29 PM  CC: Eilene Grater, MD  Addendum: PFT performed today showing severe obstruction and combined with severe restriction, moderately reduced diffusing capacity  Arterial blood gas reveals compensated respiratory acidosis

## 2023-06-08 NOTE — Telephone Encounter (Signed)
 Requested for PFT tech or whom ever to do a POC walk for PT while;e there

## 2023-06-08 NOTE — Telephone Encounter (Signed)
 Patient was seen today and was scheduled for 3 months out.

## 2023-06-08 NOTE — Progress Notes (Signed)
 Latest Reference Range & Units 06/08/23 14:55  pH, Arterial 7.35 - 7.45  7.42  pCO2 arterial 32 - 48 mmHg 69 (HH)  pO2, Arterial 83 - 108 mmHg 49 (L)  Acid-Base Excess 0.0 - 2.0 mmol/L 16.6 (H)  Bicarbonate 20.0 - 28.0 mmol/L 44.8 (H)  O2 Saturation % 87.1  Patient temperature  37.0  Collection site  LEFT RADIAL  Allens test (pass/fail) PASS  PASS  [facility]): Data is critically high (L): Data is abnormally low (H): Data is abnormally high  Critical value reported to Kelly R. Kijania-Swaringen, CMA at ordering Pulmonary office via Epic Chat.

## 2023-06-24 DIAGNOSIS — G894 Chronic pain syndrome: Secondary | ICD-10-CM | POA: Diagnosis not present

## 2023-06-24 DIAGNOSIS — M4726 Other spondylosis with radiculopathy, lumbar region: Secondary | ICD-10-CM | POA: Diagnosis not present

## 2023-06-24 DIAGNOSIS — Z79891 Long term (current) use of opiate analgesic: Secondary | ICD-10-CM | POA: Diagnosis not present

## 2023-06-24 DIAGNOSIS — M4722 Other spondylosis with radiculopathy, cervical region: Secondary | ICD-10-CM | POA: Diagnosis not present

## 2023-07-05 ENCOUNTER — Ambulatory Visit (HOSPITAL_BASED_OUTPATIENT_CLINIC_OR_DEPARTMENT_OTHER): Attending: Pulmonary Disease | Admitting: Pulmonary Disease

## 2023-07-05 DIAGNOSIS — Z9981 Dependence on supplemental oxygen: Secondary | ICD-10-CM | POA: Diagnosis not present

## 2023-07-05 DIAGNOSIS — G4734 Idiopathic sleep related nonobstructive alveolar hypoventilation: Secondary | ICD-10-CM | POA: Insufficient documentation

## 2023-07-05 DIAGNOSIS — R0683 Snoring: Secondary | ICD-10-CM | POA: Insufficient documentation

## 2023-07-27 DIAGNOSIS — E559 Vitamin D deficiency, unspecified: Secondary | ICD-10-CM | POA: Diagnosis not present

## 2023-07-27 DIAGNOSIS — Z0189 Encounter for other specified special examinations: Secondary | ICD-10-CM | POA: Diagnosis not present

## 2023-07-27 DIAGNOSIS — Z0130 Encounter for examination of blood pressure without abnormal findings: Secondary | ICD-10-CM | POA: Diagnosis not present

## 2023-07-27 DIAGNOSIS — N182 Chronic kidney disease, stage 2 (mild): Secondary | ICD-10-CM | POA: Diagnosis not present

## 2023-07-27 DIAGNOSIS — Z125 Encounter for screening for malignant neoplasm of prostate: Secondary | ICD-10-CM | POA: Diagnosis not present

## 2023-07-27 DIAGNOSIS — I129 Hypertensive chronic kidney disease with stage 1 through stage 4 chronic kidney disease, or unspecified chronic kidney disease: Secondary | ICD-10-CM | POA: Diagnosis not present

## 2023-07-27 DIAGNOSIS — E7800 Pure hypercholesterolemia, unspecified: Secondary | ICD-10-CM | POA: Diagnosis not present

## 2023-07-27 DIAGNOSIS — M069 Rheumatoid arthritis, unspecified: Secondary | ICD-10-CM | POA: Diagnosis not present

## 2023-08-09 ENCOUNTER — Ambulatory Visit (HOSPITAL_COMMUNITY)
Admission: RE | Admit: 2023-08-09 | Discharge: 2023-08-09 | Disposition: A | Discharge status: Home / Self Care | Source: Ambulatory Visit | Attending: Pulmonary Disease | Admitting: Pulmonary Disease

## 2023-08-09 DIAGNOSIS — Z77090 Contact with and (suspected) exposure to asbestos: Secondary | ICD-10-CM | POA: Diagnosis not present

## 2023-08-09 DIAGNOSIS — R918 Other nonspecific abnormal finding of lung field: Secondary | ICD-10-CM | POA: Diagnosis not present

## 2023-08-09 DIAGNOSIS — R9389 Abnormal findings on diagnostic imaging of other specified body structures: Secondary | ICD-10-CM | POA: Diagnosis not present

## 2023-08-09 DIAGNOSIS — J984 Other disorders of lung: Secondary | ICD-10-CM | POA: Diagnosis not present

## 2023-08-09 DIAGNOSIS — J929 Pleural plaque without asbestos: Secondary | ICD-10-CM | POA: Diagnosis not present

## 2023-08-12 ENCOUNTER — Telehealth: Payer: Self-pay | Admitting: Pulmonary Disease

## 2023-08-12 NOTE — Telephone Encounter (Signed)
 Call patient  Sleep study result  Date of study: 07/05/2023  Impression:  Negative study for significant sleep disordered breathing with an AHI of 1.2 Study was performed on patient's usual 2 L of oxygen  at night, there was no significant oxygen  desaturations Snoring   Recommendation:  Continue oxygen  supplementation at 2 L  Encourage nonsupine sleep if able  Clinical follow-up of symptoms

## 2023-08-12 NOTE — Procedures (Signed)
 [facility] [address] [place], [place] [address] Tel: [phone]   Fax: [phone]  Polysomnography Interpretation  Patient Name:  Douglas [name], Douglas [name] Date:  07/05/2023 Referring Physician:  Dr. Jennet Epley  Indications for Polysomnography The patient is a 73 year old Male who is 6' and weighs 215.0 lbs. His BMI equals 29.4.  A full night polysomnogram was performed to evaluate for -.  Medication were reported taken at 8:33 pm.  -  TIMOLOL   SULFASAZINE  LYRICA   LISINOPRIL  HCTZ  FAMOTIDINE   BRIMONIDINE   ATORVASTATIN   ASPIRIN   ZINC  OTC NASAL SPRAY   Polysomnogram Data A full night polysomnogram recorded the standard physiologic parameters including EEG, EOG, EMG, EKG, nasal and oral airflow.  Respiratory parameters of chest and abdominal movements were recorded with Respiratory Inductance Plethysmography belts.  Oxygen  saturation was recorded by pulse oximetry.   Sleep Architecture The total recording time of the polysomnogram was 366.1 minutes.  The total sleep time was 355.5 minutes.  The patient spent 12.1% of total sleep time in Stage N1, 72.7% in Stage N2, 5.2% in Stages N3, and 10.0% in REM.  Sleep latency was 3.1 minutes.  REM latency was 174.0 minutes.  Sleep Efficiency was 97.1%.  Wake after Sleep Onset time was 7.5 minutes.  Respiratory Events The polysomnogram revealed a presence of - obstructive, - central, and - mixed apneas resulting in an Apnea index of - events per hour.  There were 7 hypopneas (>=3% desaturation and/or arousal) resulting in an Apnea\Hypopnea Index (AHI >=3% desaturation and/or arousal) of 1.2 events per hour.  There were 1 hypopnea (>=4% desaturation) resulting in an Apnea\Hypopnea Index (AHI >=4% desaturation) of 0.2 events per hour.  There were 39 Respiratory Effort Related Arousals resulting in a RERA index of 6.6 events per hour. The Respiratory Disturbance Index is 7.8 events per hour.  The  snore index was 216.0 events per hour.  Mean oxygen  saturation was 91.1%.  The lowest oxygen  saturation during sleep was 87.0%.  Time spent <=88% oxygen  saturation was 6.4 minutes (1.8%).  End Tidal CO2 during sleep ranged from - to - mmHg. End Tidal CO2 was greater than 50 mmHg for - minutes and greater than 55 mmHg for - minutes.  Limb Activity There were 40 total limb movements recorded, of this total, 40 were classified as PLMs.  PLM index was 6.8 per hour and PLM associated with Arousals index was 0.7 per hour.  Cardiac Summary The average pulse rate was 76.3 bpm.  The minimum pulse rate was 60.0 bpm while the maximum pulse rate was 104.0 bpm.  Cardiac rhythm was normal/abnormal.  Diagnosis:  Study is negative for significant sleep disordered breathing with an AHI of 0.2.   No significant oxygen  desaturations, study was performed with patient on 2 L of oxygen  which is his usual  sleep efficiency is excellent No significant periodic limb movements-PLM index of 6.8 Normal cardiac rhythm Snoring  Recommendations:  Oxygen  supplementation at 2 L Encourage sleeping in lateral position if able Clinical follow up of symptoms   This study was personally reviewed and electronically signed by: Dr. Jennet Epley Accredited Board Certified in Sleep Medicine Date/Time: 08/12/23        Diagnostic PSG Report  Patient Name: Douglas, [name] Date: 07/05/2023  Date of Birth: 08/03/1950 Study Type: Diagnostic  Age: 89 year MRN #: [id number]  Sex: Male Interpreting Physician: EPLEY JENNET [id number]  Height: 6' Referring Physician: Dr. Jennet Epley  Weight: 215.0 lbs Recording  Tech: Orie Sires RRT RPSGT RST  BMI: 29.4 Scoring Tech: Orie Sires RRT RPSGT RST  ESS: 4 Neck Size: 16  Mask Type Full Face Mask Airfit F-10    Mask Size: Large Supplemental O2: 2 LPM   Study Overview  Lights Off: 11:07:09 PM  Count Index  Lights On: 05:13:18 AM Awakenings: 12 2.0   Time in Bed: 366.1 min. Arousals: 75 12.7  Total Sleep Time: 355.5 min. AHI (>=3% Desat and/or Ar.): 7 1.2   Sleep Efficiency: 97.1% AHI (>=4% Desat): 1 0.2   Sleep Latency: 3.1 min. Limb Movements: 40 6.8  Wake After Sleep Onset: 7.5 min. Snore: 1280 216.0  REM Latency from Sleep Onset: 174.0 min. Desaturations: 15 2.5     Minimum SpO2 TST: 87.0%    Sleep Architecture  % of Time in Bed Stages Time (mins) % Sleep Time  Wake 11.5   Stage N1 43.0 12.1%  Stage N2 258.5 72.7%  Stage N3 18.5 5.2%  REM 35.5 10.0%   Arousal Summary   NREM REM Sleep Index  Respiratory Arousals 39 - 39 6.6  PLM Arousals 4 - 4 0.7  Isolated Limb Movement Arousals - - - -  Snore Arousals 8 - 8 1.4  Spontaneous Arousals 24 - 24 4.1  Total 75 - 75 12.7   Limb Movement Summary   Count Index  Isolated Limb Movements - -  Periodic Limb Movements (PLMs) 40 6.8  Total Limb Movements 40 6.8    Respiratory Summary   By Sleep Stage By Body Position Total   NREM REM Supine Non-Supine   Time (min) 320.0 35.5 355.5 - 355.5         Obstructive Apnea - - - - -  Mixed Apnea - - - - -  Central Apnea - - - - -  Total Apneas - - - - -  Total Apnea Index - - - - -         Hypopneas (>=3% Desat and/or Ar.) 3 4 7  - 7  AHI (>=3% Desat and/or Ar.) 0.6 6.8 1.2 - 1.2         Hypopneas (>=4% Desat) - 1 1 - 1  AHI (>=4% Desat) - 1.7 0.2 - 0.2          RERAs 39 - 39 - 39  RERA Index 7.3 - 6.6 - 6.6         RDI 7.9 6.8 7.8 - 7.8    Respiratory Event Type Index  Central Apneas -  Obstructive Apneas -  Mixed Apneas -  Central Hypopneas -  Obstructive Hypopneas 1.5  Central Apnea + Hypopnea (CAHI) -  Obstructive Apnea + Hypopnea (OAHI) 1.5   Respiratory Event Durations   Apnea Hypopnea   NREM REM NREM REM  Average (seconds) - - 18.5 26.1  Maximum (seconds) - - 28.5 40.8    Oxygen  Saturation Summary   Wake NREM REM TST TIB  Average SpO2 (%) 90.1% 91.2% 90.1% 91.1% 91.1%  Minimum SpO2 (%) 81.0%  88.0% 87.0% 87.0% 81.0%  Maximum SpO2 (%) 95.0% 96.0% 93.0% 96.0% 96.0%   Oxygen  Saturation Distribution  Range (%) Time in range (min) Time in range (%)  90.0 - 100.0 219.2 60.5%  80.0 - 90.0 143.1 39.5%  70.0 - 80.0 - -  60.0 - 70.0 - -  50.0 - 60.0 - -  0.0 - 50.0 - -  Time Spent <=88% SpO2  Range (%) Time in range (min) Time in range (%)  0.0 - 88.0 6.4 1.8%      Count Index  Desaturations 15 2.5    Cardiac Summary   Wake NREM REM Sleep Total  Average Pulse Rate (BPM) 80.1 75.5 82.3 76.2 76.3  Minimum Pulse Rate (BPM) 70.0 60.0 67.0 60.0 60.0  Maximum Pulse Rate (BPM) 95.0 104.0 89.0 104.0 104.0   Pulse Rate Distribution:  Range (bpm) Time in range (min) Time in range (%)  0.0 - 40.0 - -  40.0 - 60.0 0.0 0.0%  60.0 - 80.0 298.0 81.3%  80.0 - 100.0 67.1 18.3%  100.0 - 120.0 0.2 0.1%  120.0 - 140.0 - -  140.0 - 200.0 - -   EtCO2 Summary  Stage Min (mmHg) Average (mmHg) Max (mmHg)  Wake - - -  NREM (1+2+3) - - -  REM - - -   EtCO2 Distribution:  Range (mmHg) Time in range (min) Time in range (%)  20.0 - 40.0 - -  40.0 - 50.0 - -  50.0 - 100.0 - -  55.0 - 100.0 - -  Excluded data <20.0 & >65.0 367.0 100.0%   Supplemental O2 Summary  O2 Level Time (min) Wake (min) NREM (min) REM (min) Sleep Eff% OA# CA# MA# Hyp# (>=3%) AHI (>=3%) Hyp# (>=4%) AHI (>=%4) RERA RDI SpO2 <=88% (min) Min SpO2 Mean SpO2 Ar. Index  O2: 2 367.0 11.5 320.0 35.5 96.9% - - - 7 1.2 1  0.2 39  7.8  3.3 87.0 91.1 12.7   Hypnograms                         Technologist Comments  Patient was ordered as a split night study. Patient is a 73 year old white male who was sent to the sleep center for OSA. Patient did not meet split night criteria per standing protocol. Patient did not have sufficient respiratory events  to warrant CPAP trial per standing protocol Patient study was started with patient on 2LPM of oxygen  via nasal cannula at 11:07 pm, per lab protocol  and for patient's use of home oxygen  24 hours a day. Patient reported taking his mediation at 8:33 pm PLMA's/PLMA's were noted. Questionable cardiac arrhythmias were noted see epochs for examples: etc. One restroom visit was noted. Patient was fitted with a Resmed Airfit F-20 nasal/oral full-face mask medium size with heated humidification.

## 2023-08-19 DIAGNOSIS — Z79891 Long term (current) use of opiate analgesic: Secondary | ICD-10-CM | POA: Diagnosis not present

## 2023-08-19 DIAGNOSIS — M4722 Other spondylosis with radiculopathy, cervical region: Secondary | ICD-10-CM | POA: Diagnosis not present

## 2023-08-19 DIAGNOSIS — M4726 Other spondylosis with radiculopathy, lumbar region: Secondary | ICD-10-CM | POA: Diagnosis not present

## 2023-08-19 DIAGNOSIS — G894 Chronic pain syndrome: Secondary | ICD-10-CM | POA: Diagnosis not present

## 2023-09-08 ENCOUNTER — Ambulatory Visit: Admitting: Pulmonary Disease

## 2023-09-08 VITALS — BP 109/69 | HR 68 | Ht 74.0 in | Wt 215.0 lb

## 2023-09-08 DIAGNOSIS — Z87891 Personal history of nicotine dependence: Secondary | ICD-10-CM

## 2023-09-08 DIAGNOSIS — R0902 Hypoxemia: Secondary | ICD-10-CM

## 2023-09-08 DIAGNOSIS — G8929 Other chronic pain: Secondary | ICD-10-CM

## 2023-09-08 DIAGNOSIS — I5030 Unspecified diastolic (congestive) heart failure: Secondary | ICD-10-CM

## 2023-09-08 DIAGNOSIS — R9389 Abnormal findings on diagnostic imaging of other specified body structures: Secondary | ICD-10-CM

## 2023-09-08 NOTE — Telephone Encounter (Signed)
 Douglas [name]  [place], Sherlean LENORA Douglas [name]; Joylene Cain; Tucker, Dolanda; Sheree, Merilee This is the Note on the sales order for the POC  Please hold-This patient's o2 is in the maintenance period with [facility]  Sent a message to [employer] for how long the maintenance period is.

## 2023-09-08 NOTE — Telephone Encounter (Signed)
 Sent a message to [employer] to confirm order

## 2023-09-08 NOTE — Progress Notes (Signed)
 Douglas [name]    [id number]    [birth year]  Primary Care Physician:Pradhan, Laurance POUR, MD  Referring Physician: Toy Laurance POUR, MD [address] [place],  [place] [address]  Chief complaint:   Chronic shortness of breath Musculoskeletal pain and deconditioning  HPI:  History of heart failure with preserved ejection fraction, right-sided cardiac dysfunction Atrial fibrillation History of PE previously  Has had multiple falls recently  Quit smoking over 40 years ago  Continues to use oxygen  supplementation during the day and at night  Has had a few studies done recently echocardiogram shows pulmonary hypertension likely related to chronic diastolic dysfunction  Sleep study was negative for significant sleep apnea did show nocturnal hypoxemia  Multiple falls recently just unsteady on his feet Gait unsteadiness, does walk with a cane  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, did air conditioning work  He is on opiates and Lyrica  -His primary doctor is concerned about his doses of opiates  Spouse denies snoring, he feels he rests well but tired during the day  Was prescribed oxygen  at night a few years back Prescribed oxygen  during the day which he only uses whenever he is at home, he has difficulty with his gait and ambulation which makes oxygen  supplementation with activity more difficult He does not have a portable device at present  Outpatient Encounter Medications as of 09/08/2023  Medication Sig   acetaminophen  (TYLENOL ) 325 MG tablet Take 2 tablets (650 mg total) by mouth every 6 (six) hours as needed for mild pain (pain score 1-3) (or Fever >/= 101).   amLODipine  (NORVASC ) 5 MG tablet Take 1 tablet (5 mg total) by mouth daily.   atorvastatin  (LIPITOR) 40 MG tablet Take 1 tablet (40 mg total) by mouth at bedtime.   brimonidine  (ALPHAGAN ) 0.2 % ophthalmic solution Place 1 drop into both eyes 2 (two) times daily.   famotidine  (PEPCID ) 20 MG tablet Take by  mouth.   hydrALAZINE  (APRESOLINE ) 50 MG tablet Take 0.5 tablets (25 mg total) by mouth 3 (three) times daily.   HYDROcodone -acetaminophen  (NORCO) 10-325 MG tablet Take 1 tablet by mouth every 6 (six) hours as needed for severe pain (pain score 7-10).   lisinopril -hydrochlorothiazide  (ZESTORETIC ) 10-12.5 MG tablet Take 1 tablet by mouth daily.   pregabalin  (LYRICA ) 150 MG capsule Take 1 capsule (150 mg total) by mouth 2 (two) times daily.   sulfaSALAzine  (AZULFIDINE ) 500 MG tablet Take 2 tablets (1,000 mg total) by mouth 2 (two) times daily.   timolol  (TIMOPTIC ) 0.5 % ophthalmic solution Place 1 drop into both eyes 2 (two) times daily.   Vitamin D, Ergocalciferol, (DRISDOL) 1.25 MG (50000 UNIT) CAPS capsule Take 50,000 Units by mouth once a week.   famotidine  (PEPCID ) 20 MG tablet Take 20 mg by mouth 2 (two) times daily. (Patient not taking: Reported on 06/08/2023)   No facility-administered encounter medications on file as of 09/08/2023.    Allergies as of 09/08/2023 - Review Complete 09/08/2023  Allergen Reaction Noted   Ancef [cefazolin] Rash 08/24/2012   Penicillins Hives 11/24/2011   Firvanq [vancomycin] Rash 08/22/2012   Hydrochlorothiazide  Rash 08/11/2013   Ms contin [morphine] Rash 02/19/2020   Other Rash 08/11/2013   Oxycontin [oxycodone] Rash 02/19/2020    Past Medical History:  Diagnosis Date   Basal cell carcinoma 01/24/2014   left temple tx mohs Dr Leshin 03/12/2014   Chronic pain    Hypertension     Past Surgical History:  Procedure Laterality  Date   BACK SURGERY     x 3   REPLACEMENT TOTAL KNEE BILATERAL     TOTAL HIP ARTHROPLASTY     bilateral    No family history on file.  Social History   Socioeconomic History   Marital status: Married    Spouse name: Not on file   Number of children: Not on file   Years of education: Not on file   Highest education level: Not on file  Occupational History   Not on file  Tobacco Use   Smoking status: Former    Smokeless tobacco: Never  Substance and Sexual Activity   Alcohol use: No   Drug use: No   Sexual activity: Not on file  Other Topics Concern   Not on file  Social History Narrative   Not on file   Social Drivers of Health   Financial Resource Strain: Not on file  Food Insecurity: No Food Insecurity (11/29/2022)   Hunger Vital Sign    Worried About Running Out of Food in the Last Year: Never true    Ran Out of Food in the Last Year: Never true  Transportation Needs: No Transportation Needs (11/29/2022)   PRAPARE - Administrator, Civil Service (Medical): No    Lack of Transportation (Non-Medical): No  Physical Activity: Not on file  Stress: Not on file  Social Connections: Not on file  Intimate Partner Violence: Not At Risk (11/29/2022)   Humiliation, Afraid, Rape, and Kick questionnaire    Fear of Current or Ex-Partner: No    Emotionally Abused: No    Physically Abused: No    Sexually Abused: No    Review of Systems  Constitutional:  Positive for fatigue.  Respiratory:  Positive for shortness of breath.   Psychiatric/Behavioral:  Positive for sleep disturbance.     Vitals:   09/08/23 0933  BP: 109/69  Pulse: 68  SpO2: 93%     Physical Exam Constitutional:      Appearance: Normal appearance.  HENT:     Head: Normocephalic.     Mouth/Throat:     Mouth: Mucous membranes are moist.  Eyes:     General: No scleral icterus. Cardiovascular:     Rate and Rhythm: Normal rate and regular rhythm.     Heart sounds: No murmur heard.    No friction rub.  Pulmonary:     Effort: No respiratory distress.     Breath sounds: No stridor. No wheezing or rhonchi.  Musculoskeletal:     Cervical back: No rigidity or tenderness.  Neurological:     Mental Status: He is alert.  Psychiatric:        Mood and Affect: Mood normal.    Data Reviewed: Records from recent hospitalization in October 2024 reviewed  Pulmonary function test with combined obstruction and  restriction, moderately reduced diffusing capacity  Echocardiogram April 2024 with right-sided cardiac dysfunction, diastolic dysfunction Echocardiogram 04/27/2023 reviewed showing normal ejection fraction, severe pulmonary hypertension  CT scan of the chest reviewed showing some groundglass changes, atelectasis in the right upper lobe  Follow-up CT 08/09/2023 shows stable findings, nodular pleural thickening along the minor fissure without focal mass lesion or pleural calcifications  Assessment:   Abnormal CT scan of the chest showing right upper lobe infiltrate -Follow-up is stable  Pulmonary hypertension likely due to underlying lung disease and heart disease - Stable - Continue oxygen  supplementation  Severe musculoskeletal pain and discomfort causing significant deconditioning - Multiple falls  History  of stroke  Chronic respiratory failure - Continue oxygen  supplementation  Reformed smoker, quit over 40 years ago, was exposed to asbestos from working in [employer]  Polyarthropathy  Chronic pain   Plan/Recommendations:  Requires 3 L of oxygen  with activity - Ensure he has an order for portable concentrator for 3 L  Follow-up in 6 months  Continue oxygen  supplementation  Encouraged to call with significant concerns  I spent 30 minutes dedicated to the care of this patient on the date of this encounter to include previsit review of records, face-to-face time with the patient discussing conditions above, post visit ordering of testing,ordering medications,independentlyinterpreting results, clinical documentation with electronic health record and communicated necessary findings to members of the patient's care team   Jennet Epley MD [facility] 09/08/2023, 9:55 AM  CC: Toy Laurance POUR, MD

## 2023-09-08 NOTE — Patient Instructions (Addendum)
 We will make sure you follow-up on the order that was placed for the oxygen  supplementation with a possible concentrator at 3 L  Continue using oxygen  supplementation at night as well  Follow-up in 6 months  Call us  with significant concerns

## 2023-09-09 NOTE — Telephone Encounter (Signed)
 Patient is needing a walk test and will be doing it with [employer] to fill out the order.

## 2023-09-20 ENCOUNTER — Telehealth: Payer: Self-pay

## 2023-09-20 NOTE — Telephone Encounter (Signed)
 Copied from CRM [id number]. Topic: Clinical - Medical Advice >> Sep 20, 2023 12:42 PM Joesph PARAS wrote: Reason for CRM: Reena is calling to inquire about clarification regarding patient's walk test . C/B at [phone] - Ask to speak directly to [facility] in the [place] office.    Tried to call sharon back was unenviable was told she will call back.

## 2023-09-21 NOTE — Telephone Encounter (Signed)
 Called Reena back and there was no answer- LMTCB

## 2023-10-01 NOTE — Telephone Encounter (Signed)
 Called [facility]- msg stated that they were currently experiencing technical difficulties, and to try calling again later.

## 2023-11-01 DIAGNOSIS — Z79891 Long term (current) use of opiate analgesic: Secondary | ICD-10-CM | POA: Diagnosis not present

## 2023-11-01 DIAGNOSIS — M4722 Other spondylosis with radiculopathy, cervical region: Secondary | ICD-10-CM | POA: Diagnosis not present

## 2023-11-01 DIAGNOSIS — G894 Chronic pain syndrome: Secondary | ICD-10-CM | POA: Diagnosis not present

## 2023-11-01 DIAGNOSIS — M4726 Other spondylosis with radiculopathy, lumbar region: Secondary | ICD-10-CM | POA: Diagnosis not present

## 2023-12-27 DIAGNOSIS — I129 Hypertensive chronic kidney disease with stage 1 through stage 4 chronic kidney disease, or unspecified chronic kidney disease: Secondary | ICD-10-CM | POA: Diagnosis not present

## 2023-12-27 DIAGNOSIS — Z0189 Encounter for other specified special examinations: Secondary | ICD-10-CM | POA: Diagnosis not present

## 2023-12-27 DIAGNOSIS — E7800 Pure hypercholesterolemia, unspecified: Secondary | ICD-10-CM | POA: Diagnosis not present

## 2023-12-27 DIAGNOSIS — Z23 Encounter for immunization: Secondary | ICD-10-CM | POA: Diagnosis not present

## 2023-12-27 DIAGNOSIS — Z0000 Encounter for general adult medical examination without abnormal findings: Secondary | ICD-10-CM | POA: Diagnosis not present

## 2023-12-27 DIAGNOSIS — G459 Transient cerebral ischemic attack, unspecified: Secondary | ICD-10-CM | POA: Diagnosis not present

## 2023-12-27 DIAGNOSIS — M069 Rheumatoid arthritis, unspecified: Secondary | ICD-10-CM | POA: Diagnosis not present

## 2023-12-27 DIAGNOSIS — Z0130 Encounter for examination of blood pressure without abnormal findings: Secondary | ICD-10-CM | POA: Diagnosis not present

## 2023-12-28 DIAGNOSIS — M4726 Other spondylosis with radiculopathy, lumbar region: Secondary | ICD-10-CM | POA: Diagnosis not present

## 2023-12-28 DIAGNOSIS — M4722 Other spondylosis with radiculopathy, cervical region: Secondary | ICD-10-CM | POA: Diagnosis not present

## 2023-12-28 DIAGNOSIS — Z79891 Long term (current) use of opiate analgesic: Secondary | ICD-10-CM | POA: Diagnosis not present

## 2023-12-28 DIAGNOSIS — Z0189 Encounter for other specified special examinations: Secondary | ICD-10-CM | POA: Diagnosis not present

## 2023-12-28 DIAGNOSIS — I129 Hypertensive chronic kidney disease with stage 1 through stage 4 chronic kidney disease, or unspecified chronic kidney disease: Secondary | ICD-10-CM | POA: Diagnosis not present

## 2023-12-28 DIAGNOSIS — Z0130 Encounter for examination of blood pressure without abnormal findings: Secondary | ICD-10-CM | POA: Diagnosis not present

## 2023-12-28 DIAGNOSIS — G894 Chronic pain syndrome: Secondary | ICD-10-CM | POA: Diagnosis not present
